# Patient Record
Sex: Male | Born: 1975
Health system: Southern US, Community
[De-identification: ages and names within clinical notes are randomized; demographics above are authoritative.]

## PROBLEM LIST (undated history)

## (undated) DIAGNOSIS — E1110 Type 2 diabetes mellitus with ketoacidosis without coma: Secondary | ICD-10-CM

## (undated) DIAGNOSIS — M069 Rheumatoid arthritis, unspecified: Secondary | ICD-10-CM

## (undated) DIAGNOSIS — Z227 Latent tuberculosis: Secondary | ICD-10-CM

## (undated) HISTORY — DX: Rheumatoid arthritis, unspecified: M06.9

## (undated) HISTORY — DX: Type 2 diabetes mellitus with ketoacidosis without coma: E11.10

## (undated) HISTORY — PX: EYE SURGERY: SHX253

## (undated) HISTORY — PX: HERNIA REPAIR: SHX51

## (undated) HISTORY — DX: Latent tuberculosis: Z22.7

---

## 2001-02-14 ENCOUNTER — Encounter: Payer: Self-pay | Admitting: Emergency Medicine

## 2001-02-14 ENCOUNTER — Inpatient Hospital Stay (HOSPITAL_COMMUNITY): Admission: EM | Admit: 2001-02-14 | Discharge: 2001-02-15 | Payer: Self-pay

## 2005-04-01 ENCOUNTER — Emergency Department (HOSPITAL_COMMUNITY): Admission: EM | Admit: 2005-04-01 | Discharge: 2005-04-01 | Payer: Self-pay | Admitting: Emergency Medicine

## 2007-08-15 ENCOUNTER — Emergency Department (HOSPITAL_COMMUNITY): Admission: EM | Admit: 2007-08-15 | Discharge: 2007-08-15 | Payer: Self-pay | Admitting: Emergency Medicine

## 2007-08-19 ENCOUNTER — Emergency Department (HOSPITAL_COMMUNITY): Admission: EM | Admit: 2007-08-19 | Discharge: 2007-08-19 | Payer: Self-pay | Admitting: Emergency Medicine

## 2007-12-19 ENCOUNTER — Ambulatory Visit (HOSPITAL_COMMUNITY): Admission: RE | Admit: 2007-12-19 | Discharge: 2007-12-19 | Payer: Self-pay | Admitting: Family Medicine

## 2008-02-25 ENCOUNTER — Encounter: Admission: RE | Admit: 2008-02-25 | Discharge: 2008-02-25 | Payer: Self-pay | Admitting: Rheumatology

## 2008-03-10 ENCOUNTER — Inpatient Hospital Stay (HOSPITAL_COMMUNITY): Admission: EM | Admit: 2008-03-10 | Discharge: 2008-03-13 | Payer: Self-pay | Admitting: Emergency Medicine

## 2008-03-12 ENCOUNTER — Encounter (INDEPENDENT_AMBULATORY_CARE_PROVIDER_SITE_OTHER): Payer: Self-pay | Admitting: *Deleted

## 2008-03-12 LAB — CONVERTED CEMR LAB
Cholesterol: 174 mg/dL
LDL Cholesterol: 121 mg/dL

## 2008-03-20 ENCOUNTER — Ambulatory Visit: Payer: Self-pay | Admitting: Internal Medicine

## 2008-05-21 ENCOUNTER — Encounter: Payer: Self-pay | Admitting: Internal Medicine

## 2008-06-03 ENCOUNTER — Encounter: Payer: Self-pay | Admitting: Internal Medicine

## 2008-06-12 ENCOUNTER — Encounter: Payer: Self-pay | Admitting: Internal Medicine

## 2008-06-18 ENCOUNTER — Encounter: Payer: Self-pay | Admitting: Internal Medicine

## 2008-06-24 ENCOUNTER — Encounter: Payer: Self-pay | Admitting: Internal Medicine

## 2008-06-24 ENCOUNTER — Ambulatory Visit: Payer: Self-pay | Admitting: *Deleted

## 2008-06-24 DIAGNOSIS — K4091 Unilateral inguinal hernia, without obstruction or gangrene, recurrent: Secondary | ICD-10-CM

## 2008-06-24 DIAGNOSIS — I200 Unstable angina: Secondary | ICD-10-CM | POA: Insufficient documentation

## 2008-06-24 DIAGNOSIS — E1165 Type 2 diabetes mellitus with hyperglycemia: Secondary | ICD-10-CM | POA: Insufficient documentation

## 2008-06-24 DIAGNOSIS — E113399 Type 2 diabetes mellitus with moderate nonproliferative diabetic retinopathy without macular edema, unspecified eye: Secondary | ICD-10-CM

## 2008-06-24 DIAGNOSIS — I10 Essential (primary) hypertension: Secondary | ICD-10-CM | POA: Insufficient documentation

## 2008-06-24 DIAGNOSIS — M0600 Rheumatoid arthritis without rheumatoid factor, unspecified site: Secondary | ICD-10-CM | POA: Insufficient documentation

## 2008-06-24 DIAGNOSIS — R079 Chest pain, unspecified: Secondary | ICD-10-CM

## 2008-06-24 DIAGNOSIS — I2089 Other forms of angina pectoris: Secondary | ICD-10-CM | POA: Insufficient documentation

## 2008-06-24 LAB — CONVERTED CEMR LAB
ALT: 17 units/L (ref 0–53)
AST: 21 units/L (ref 0–37)
Basophils Absolute: 0 10*3/uL (ref 0.0–0.1)
Basophils Relative: 1 % (ref 0–1)
Blood Glucose, Fingerstick: 195
CO2: 24 meq/L (ref 19–32)
Cholesterol: 187 mg/dL (ref 0–200)
Creatinine, Urine: 63.7 mg/dL
Eosinophils Relative: 5 % (ref 0–5)
HCT: 47.5 % (ref 39.0–52.0)
Hgb A1c MFr Bld: 11.9 %
LDL Cholesterol: 118 mg/dL — ABNORMAL HIGH (ref 0–99)
Lymphocytes Relative: 24 % (ref 12–46)
MCHC: 33.3 g/dL (ref 30.0–36.0)
Microalb Creat Ratio: 60.6 mg/g — ABNORMAL HIGH (ref 0.0–30.0)
Neutro Abs: 3.9 10*3/uL (ref 1.7–7.7)
Platelets: 338 10*3/uL (ref 150–400)
RDW: 12.7 % (ref 11.5–15.5)
Sodium: 136 meq/L (ref 135–145)
Total Bilirubin: 0.5 mg/dL (ref 0.3–1.2)
Total CHOL/HDL Ratio: 3.5
Total Protein: 7.9 g/dL (ref 6.0–8.3)
VLDL: 15 mg/dL (ref 0–40)

## 2008-07-09 ENCOUNTER — Ambulatory Visit (HOSPITAL_COMMUNITY): Admission: RE | Admit: 2008-07-09 | Discharge: 2008-07-09 | Payer: Self-pay | Admitting: *Deleted

## 2008-07-09 ENCOUNTER — Encounter: Payer: Self-pay | Admitting: Internal Medicine

## 2008-07-09 ENCOUNTER — Ambulatory Visit: Payer: Self-pay | Admitting: *Deleted

## 2008-07-09 DIAGNOSIS — E785 Hyperlipidemia, unspecified: Secondary | ICD-10-CM

## 2008-07-09 LAB — CONVERTED CEMR LAB: Blood Glucose, Fingerstick: 398

## 2008-09-10 ENCOUNTER — Ambulatory Visit: Payer: Self-pay | Admitting: Internal Medicine

## 2008-09-10 LAB — CONVERTED CEMR LAB: Blood Glucose, Fingerstick: 223

## 2008-09-11 ENCOUNTER — Ambulatory Visit: Payer: Self-pay | Admitting: Cardiology

## 2008-09-23 ENCOUNTER — Ambulatory Visit: Payer: Self-pay

## 2008-09-23 ENCOUNTER — Encounter: Payer: Self-pay | Admitting: Cardiology

## 2008-10-26 ENCOUNTER — Ambulatory Visit (HOSPITAL_COMMUNITY): Admission: RE | Admit: 2008-10-26 | Discharge: 2008-10-27 | Payer: Self-pay | Admitting: Surgery

## 2008-10-30 ENCOUNTER — Telehealth: Payer: Self-pay | Admitting: *Deleted

## 2008-11-27 ENCOUNTER — Telehealth (INDEPENDENT_AMBULATORY_CARE_PROVIDER_SITE_OTHER): Payer: Self-pay | Admitting: *Deleted

## 2008-12-21 ENCOUNTER — Telehealth: Payer: Self-pay | Admitting: Internal Medicine

## 2008-12-24 ENCOUNTER — Ambulatory Visit: Payer: Self-pay | Admitting: *Deleted

## 2008-12-24 ENCOUNTER — Encounter: Payer: Self-pay | Admitting: Internal Medicine

## 2008-12-24 LAB — CONVERTED CEMR LAB
BUN: 14 mg/dL (ref 6–23)
Blood Glucose, Fingerstick: 266
CO2: 22 meq/L (ref 19–32)
Calcium: 9.5 mg/dL (ref 8.4–10.5)
Chloride: 103 meq/L (ref 96–112)
Creatinine, Ser: 0.56 mg/dL (ref 0.40–1.50)
Glucose, Bld: 166 mg/dL — ABNORMAL HIGH (ref 70–99)
Hgb A1c MFr Bld: 12 %
Total Bilirubin: 0.4 mg/dL (ref 0.3–1.2)

## 2009-01-18 ENCOUNTER — Telehealth (INDEPENDENT_AMBULATORY_CARE_PROVIDER_SITE_OTHER): Payer: Self-pay | Admitting: *Deleted

## 2009-01-27 ENCOUNTER — Telehealth: Payer: Self-pay | Admitting: Internal Medicine

## 2009-01-28 ENCOUNTER — Encounter (INDEPENDENT_AMBULATORY_CARE_PROVIDER_SITE_OTHER): Payer: Self-pay | Admitting: Internal Medicine

## 2009-01-28 ENCOUNTER — Ambulatory Visit: Payer: Self-pay | Admitting: *Deleted

## 2009-01-28 ENCOUNTER — Telehealth (INDEPENDENT_AMBULATORY_CARE_PROVIDER_SITE_OTHER): Payer: Self-pay | Admitting: *Deleted

## 2009-01-28 LAB — CONVERTED CEMR LAB
Blood Glucose, Fingerstick: 302
Blood Glucose, Home Monitor: 2 mg/dL

## 2009-01-29 ENCOUNTER — Telehealth (INDEPENDENT_AMBULATORY_CARE_PROVIDER_SITE_OTHER): Payer: Self-pay | Admitting: Internal Medicine

## 2009-01-29 LAB — CONVERTED CEMR LAB
ALT: 64 units/L — ABNORMAL HIGH (ref 0–53)
AST: 32 units/L (ref 0–37)
CO2: 23 meq/L (ref 19–32)
Calcium: 9.4 mg/dL (ref 8.4–10.5)
Chloride: 102 meq/L (ref 96–112)
Creatinine, Ser: 0.61 mg/dL (ref 0.40–1.50)
Platelets: 286 10*3/uL (ref 150–400)
RDW: 12.8 % (ref 11.5–15.5)
Sodium: 137 meq/L (ref 135–145)
Total Protein: 7.2 g/dL (ref 6.0–8.3)
WBC: 5.8 10*3/uL (ref 4.0–10.5)

## 2009-02-24 ENCOUNTER — Telehealth (INDEPENDENT_AMBULATORY_CARE_PROVIDER_SITE_OTHER): Payer: Self-pay | Admitting: *Deleted

## 2009-02-25 ENCOUNTER — Encounter (INDEPENDENT_AMBULATORY_CARE_PROVIDER_SITE_OTHER): Payer: Self-pay | Admitting: *Deleted

## 2009-03-02 ENCOUNTER — Ambulatory Visit: Payer: Self-pay | Admitting: Internal Medicine

## 2009-03-02 ENCOUNTER — Encounter: Payer: Self-pay | Admitting: Internal Medicine

## 2009-03-04 LAB — CONVERTED CEMR LAB
ALT: 44 units/L (ref 0–53)
CO2: 23 meq/L (ref 19–32)
Calcium: 10.3 mg/dL (ref 8.4–10.5)
Chloride: 102 meq/L (ref 96–112)
GFR calc Af Amer: >60 mL/min (ref >60)
GFR calc non Af Amer: >60 mL/min (ref >60)
Glucose, Bld: 117 mg/dL — ABNORMAL HIGH (ref 70–99)
LDL Cholesterol: 106 mg/dL — ABNORMAL HIGH (ref 0–99)
Sodium: 139 meq/L (ref 135–145)
Total Bilirubin: 0.4 mg/dL (ref 0.3–1.2)
Total Protein: 8 g/dL (ref 6.0–8.3)

## 2009-04-16 ENCOUNTER — Telehealth (INDEPENDENT_AMBULATORY_CARE_PROVIDER_SITE_OTHER): Payer: Self-pay | Admitting: *Deleted

## 2009-04-30 ENCOUNTER — Encounter: Payer: Self-pay | Admitting: Internal Medicine

## 2009-05-04 ENCOUNTER — Ambulatory Visit: Payer: Self-pay | Admitting: Internal Medicine

## 2009-05-04 ENCOUNTER — Encounter: Payer: Self-pay | Admitting: Internal Medicine

## 2009-05-04 ENCOUNTER — Encounter (INDEPENDENT_AMBULATORY_CARE_PROVIDER_SITE_OTHER): Payer: Self-pay | Admitting: Internal Medicine

## 2009-05-04 LAB — CONVERTED CEMR LAB
Blood Glucose, Fingerstick: 214
Hgb A1c MFr Bld: 5.8 %

## 2009-05-05 LAB — CONVERTED CEMR LAB
ALT: 58 units/L — ABNORMAL HIGH (ref 0–53)
AST: 37 units/L (ref 0–37)
Albumin: 4.7 g/dL (ref 3.5–5.2)
Alkaline Phosphatase: 89 units/L (ref 39–117)
Calcium: 9.9 mg/dL (ref 8.4–10.5)
Chloride: 106 meq/L (ref 96–112)
Eosinophils Absolute: 0.7 10*3/uL (ref 0.0–0.7)
HCT: 46.9 % (ref 39.0–52.0)
Hemoglobin: 16.3 g/dL (ref 13.0–17.0)
Lymphs Abs: 2.1 10*3/uL (ref 0.7–4.0)
MCV: 89.3 fL (ref 78.0–100.0)
Microalb, Ur: 14.53 mg/dL — ABNORMAL HIGH (ref 0.00–1.89)
Monocytes Absolute: 0.5 10*3/uL (ref 0.1–1.0)
Monocytes Relative: 8 % (ref 3–12)
Neutrophils Relative %: 48 % (ref 43–77)
Potassium: 4.1 meq/L (ref 3.5–5.3)
RBC: 5.25 M/uL (ref 4.22–5.81)
Sodium: 139 meq/L (ref 135–145)
Total Protein: 7.6 g/dL (ref 6.0–8.3)
WBC: 6.6 10*3/uL (ref 4.0–10.5)

## 2009-07-22 ENCOUNTER — Encounter: Payer: Self-pay | Admitting: Internal Medicine

## 2009-07-30 ENCOUNTER — Encounter: Payer: Self-pay | Admitting: Internal Medicine

## 2009-08-23 ENCOUNTER — Telehealth (INDEPENDENT_AMBULATORY_CARE_PROVIDER_SITE_OTHER): Payer: Self-pay | Admitting: *Deleted

## 2009-10-08 ENCOUNTER — Telehealth (INDEPENDENT_AMBULATORY_CARE_PROVIDER_SITE_OTHER): Payer: Self-pay | Admitting: *Deleted

## 2009-10-08 ENCOUNTER — Telehealth: Payer: Self-pay | Admitting: Internal Medicine

## 2009-10-12 ENCOUNTER — Telehealth (INDEPENDENT_AMBULATORY_CARE_PROVIDER_SITE_OTHER): Payer: Self-pay | Admitting: *Deleted

## 2009-10-12 ENCOUNTER — Telehealth: Payer: Self-pay | Admitting: *Deleted

## 2009-10-12 ENCOUNTER — Ambulatory Visit: Payer: Self-pay | Admitting: Internal Medicine

## 2009-10-12 DIAGNOSIS — R51 Headache: Secondary | ICD-10-CM

## 2009-10-12 DIAGNOSIS — R519 Headache, unspecified: Secondary | ICD-10-CM | POA: Insufficient documentation

## 2009-10-13 ENCOUNTER — Encounter: Payer: Self-pay | Admitting: Internal Medicine

## 2010-08-22 ENCOUNTER — Telehealth (INDEPENDENT_AMBULATORY_CARE_PROVIDER_SITE_OTHER): Payer: Self-pay | Admitting: *Deleted

## 2010-08-25 ENCOUNTER — Ambulatory Visit: Payer: Self-pay | Admitting: Internal Medicine

## 2010-08-25 DIAGNOSIS — K219 Gastro-esophageal reflux disease without esophagitis: Secondary | ICD-10-CM

## 2010-08-25 LAB — CONVERTED CEMR LAB
Blood Glucose, Fingerstick: 281
Hgb A1c MFr Bld: 12.3 %

## 2010-09-15 ENCOUNTER — Ambulatory Visit: Payer: Self-pay | Admitting: Internal Medicine

## 2010-09-15 LAB — CONVERTED CEMR LAB: Blood Glucose, Fingerstick: 447

## 2010-09-16 ENCOUNTER — Encounter: Payer: Self-pay | Admitting: Internal Medicine

## 2010-11-15 ENCOUNTER — Encounter: Payer: Self-pay | Admitting: Internal Medicine

## 2010-11-15 ENCOUNTER — Ambulatory Visit (HOSPITAL_COMMUNITY)
Admission: RE | Admit: 2010-11-15 | Discharge: 2010-11-15 | Payer: Self-pay | Source: Home / Self Care | Attending: Internal Medicine | Admitting: Internal Medicine

## 2010-11-15 ENCOUNTER — Ambulatory Visit: Payer: Self-pay | Admitting: Internal Medicine

## 2010-11-15 ENCOUNTER — Ambulatory Visit: Admit: 2010-11-15 | Payer: Self-pay

## 2010-11-15 LAB — CONVERTED CEMR LAB
BUN: 15 mg/dL (ref 6–23)
Bilirubin, Direct: 0.1 mg/dL (ref 0.0–0.3)
Chloride: 101 meq/L (ref 96–112)
Glucose, Bld: 163 mg/dL — ABNORMAL HIGH (ref 70–99)
Glucose, Urine, Semiquant: >=1000
HIV: NONREACTIVE
Hemoglobin: 15.3 g/dL (ref 13.0–17.0)
Indirect Bilirubin: 0.5 mg/dL (ref 0.0–0.9)
LDL Cholesterol: 163 mg/dL — ABNORMAL HIGH (ref 0–99)
MCHC: 33.9 g/dL (ref 30.0–36.0)
MCV: 90.4 fL (ref 78.0–100.0)
Microalb, Ur: 22.88 mg/dL — ABNORMAL HIGH (ref 0.00–1.89)
Nitrite: NEGATIVE
Potassium: 3.9 meq/L (ref 3.5–5.3)
RBC: 4.99 M/uL (ref 4.22–5.81)
Specific Gravity, Urine: >=1.03
Total Bilirubin: 0.6 mg/dL (ref 0.3–1.2)
VLDL: 32 mg/dL (ref 0–40)
pH: 6

## 2010-11-23 ENCOUNTER — Telehealth: Payer: Self-pay | Admitting: Internal Medicine

## 2010-11-23 ENCOUNTER — Telehealth (INDEPENDENT_AMBULATORY_CARE_PROVIDER_SITE_OTHER): Payer: Self-pay | Admitting: *Deleted
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
## 2010-12-20 NOTE — Assessment & Plan Note (Signed)
Summary: NERVES/SB.   Vital Signs:  Patient profile:   35 year old male Height:      63 inches (160.02 cm) Weight:      141.06 pounds (64.12 kg) BMI:     25.08 Temp:     98.9 degrees F (37.17 degrees C) oral Pulse rate:   85 / minute BP sitting:   124 / 72  (right arm)  Vitals Entered By: Angelina Ok RN (September 15, 2010 9:22 AM) Is Patient Diabetic? Yes Did you bring your meter with you today? No Pain Assessment Patient in pain? yes     Location: Right leg down to leg Intensity: 9 Type: aching Onset of pain  Constant Nutritional Status BMI of 25 - 29 = overweight CBG Result 447  Have you ever been in a relationship where you felt threatened, hurt or afraid?No   Does patient need assistance? Functional Status Self care Ambulation Normal Comments Check up.   Primary Care Loree Shehata:  Laren Everts MD   History of Present Illness: 35 yr old man with pmhx as described below comes to the clinic for follow up of inguinal hernia. Patient reports that is has to give 1,500 deposit for the surgery to be done. Patient is having normal bowel movements. Patient reports that hernia is reducable. He has been having fever, chills, nausea, worsening abdominal pain in area of hernia.    Patient is also having right knee pain 2/2 RA. In the past he has gotten steroid injection which helped. Last time patient had a steroid injection was March 2011. He would like another shot.   Patient is taking 2 tablets of Metformin daily.  Depression History:      The patient denies a depressed mood most of the day and a diminished interest in his usual daily activities.         Preventive Screening-Counseling & Management  Alcohol-Tobacco     Alcohol drinks/day: 0     Smoking Status: never  Problems Prior to Update: 1)  Gerd  (ICD-530.81) 2)  Headache  (ICD-784.0) 3)  Cough  (ICD-786.2) 4)  Hyperlipidemia  (ICD-272.4) 5)  Hernia  (ICD-553.9) 6)  Essential Hypertension   (ICD-401.9) 7)  Positive Ppd  (ICD-795.5) 8)  Ankle Edema  (ICD-782.3) 9)  Rheumatoid Arthritis  (ICD-714.0) 10)  Chest Pain, Intermittent  (ICD-786.50) 11)  Fatigue  (ICD-780.79) 12)  Family History Diabetes 1st Degree Relative  (ICD-V18.0) 13)  Diabetes Mellitus, Type II  (ICD-250.00)  Medications Prior to Update: 1)  Humulin 70/30 70-30 % Susp (Insulin Isophane & Regular) .... Inject 25 Units in The Morning and 15 Units At Night. 2)  Prednisone 10 Mg  Tabs (Prednisone) .... Take One Tab Daily. 3)  Pravastatin Sodium 10 Mg  Tabs (Pravastatin Sodium) .... Take 1 Tablet By Mouth Once A Day 4)  Aspirin 81 Mg  Tbec (Aspirin) .... Take 1 Tablet By Mouth Once A Day 5)  Metformin Hcl 500 Mg Xr24h-Tab (Metformin Hcl) .... Take 3 Tablet By Mouth Once A Day 6)  Percocet 5-325 Mg Tabs (Oxycodone-Acetaminophen) .... Take 1 Tablet Every 6-8 Hour For Pain As Needed. 7)  Protonix 40 Mg Pack (Pantoprazole Sodium) .... Take 1 Tablet By Mouth Two Times A Day 8)  Humira 40 Mg/0.70ml Kit (Adalimumab)  Current Medications (verified): 1)  Humulin 70/30 70-30 % Susp (Insulin Isophane & Regular) .... Inject 25 Units in The Morning and 15 Units At Night. 2)  Prednisone 10 Mg  Tabs (Prednisone) .... Take One  Tab Daily. 3)  Pravastatin Sodium 10 Mg  Tabs (Pravastatin Sodium) .... Take 1 Tablet By Mouth Once A Day 4)  Aspirin 81 Mg  Tbec (Aspirin) .... Take 1 Tablet By Mouth Once A Day 5)  Metformin Hcl 500 Mg Xr24h-Tab (Metformin Hcl) .... Take 3 Tablet By Mouth Once A Day 6)  Percocet 5-325 Mg Tabs (Oxycodone-Acetaminophen) .... Take 1 Tablet Every 6-8 Hour For Pain As Needed. 7)  Protonix 40 Mg Pack (Pantoprazole Sodium) .... Take 1 Tablet By Mouth Two Times A Day 8)  Humira 40 Mg/0.73ml Kit (Adalimumab)  Allergies: No Known Drug Allergies  Past History:  Past Medical History: Last updated: 06/24/2008 1)DKA (hospitalized in 03/10/2008) 2)DM-Diagnosed in April when admitted to [facility] with blood  glucose of 581. HgA1c was 9.9.  3)Rheumatoid Arthritis- Managed by Dr. Pollyann Savoy. Seronegative. Dr. 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 Chest X-ray on 06/03/08 showed no active disease or adenopathy. Started to take Isoniazid 300mg  daily on 06/11/08 and will take medication along with Vitamin B6 for 9 months.  Family History: Last updated: 06/24/2008 Family History Diabetes 1st degree relative Family History Hypertension  Social History: Last updated: 06/24/2008 Never Smoked Alcohol use-no Drug use-no Regular exercise-yes  Risk Factors: Alcohol Use: 0 (09/15/2010) Exercise: no (10/12/2009)  Risk Factors: Smoking Status: never (09/15/2010)  Family History: Reviewed history from 06/24/2008 and no changes required. Family History Diabetes 1st degree relative Family History Hypertension  Social History: Reviewed history from 06/24/2008 and no changes required. Never Smoked Alcohol use-no Drug use-no Regular exercise-yes  Review of Systems  The patient denies fever, chest pain, dyspnea on exertion, hemoptysis, melena, hematochezia, hematuria, and muscle weakness.    Physical Exam  General:  NAD Mouth:  MMM Lungs:  normal respiratory effort, no intercostal retractions, no accessory muscle use, and normal breath sounds.   Heart:  normal rate and regular rhythm.   Abdomen:  soft, non-tender, and normal bowel sounds.  Right reducable inguinal hernia Msk:  no joint swelling, no joint warmth, and no redness over joints.    ttp lateral aspect of right knee Extremities:  no edema Neurologic:  alert & oriented X3.     Impression & Recommendations:  Problem # 1:  HERNIA (ICD-553.9) Hernia reducable and is having normal bowel movements. Will contact Jaynee Eagles to see if patient is candidate for other finacial assistance to help expediate surgery.  Patient was instructed to continue reducing hernia to prevent strangulation until surgery can be done, and to go to the ED if he develops change in bowel movement, fever, chills, nausea, vomiting, and worsening of abdominal pain.   Problem # 2:  RHEUMATOID ARTHRITIS (ICD-714.0) After written consent was obtained, pt was placed in appropriate position. Landmarks identified by palpation. Skin was sterilized with iodine and alcohol. Numbing spray was applied to the injection site. The right subpatellar space was penetrated easily with an 22 gauge 1 1/2" needle. 3mL of Kenalog (40mg /mL) and 5mL of 1% lidocaine were injected in the joint w/o resistance. Pressure applied to injection site. Sterile dressing placed. Procedure well tolerated. No complications.   Orders: Joint Aspirate / Injection, Large (20610)  Problem # 3:  DIABETES MELLITUS, TYPE II (ICD-250.00) Uncontrolled. Patient instructed to increase morning insulin to 30 units and night insulin to 20 units. Patient was also only taking 2 tablets of metformin. Instructed to increase Metformin to 3 tablets  daily.   His updated medication list for this problem includes:    Humulin 70/30 70-30 % Susp (Insulin isophane & regular) ..... Inject 30 units in the morning and 20 units at night.    Aspirin 81 Mg Tbec (Aspirin) .Marland Kitchen... Take 1 tablet by mouth once a day    Metformin Hcl 500 Mg Xr24h-tab (Metformin hcl) .Marland Kitchen... Take 3 tablet by mouth once a day  Orders: Capillary Blood Glucose/CBG (16109)  Labs Reviewed: Creat: 0.75 (05/04/2009)    Reviewed HgBA1c results: 12.3 (08/25/2010)  7.7 (10/12/2009)  Problem # 4:  ESSENTIAL HYPERTENSION (ICD-401.9) At goal. Continue to monitor.  BP today: 124/72 Prior BP: 130/79 (08/25/2010)  Labs Reviewed: K+: 4.1 (05/04/2009) Creat: : 0.75 (05/04/2009)   Chol: 215 (03/02/2009)   HDL: 60 (03/02/2009)   LDL: 106 (03/02/2009)   TG: 246 (03/02/2009)  Complete Medication List: 1)  Humulin 70/30 70-30 % Susp  (Insulin isophane & regular) .... Inject 30 units in the morning and 20 units at night. 2)  Prednisone 10 Mg Tabs (Prednisone) .... Take one tab daily. 3)  Pravastatin Sodium 10 Mg Tabs (Pravastatin sodium) .... Take 1 tablet by mouth once a day 4)  Aspirin 81 Mg Tbec (Aspirin) .... Take 1 tablet by mouth once a day 5)  Metformin Hcl 500 Mg Xr24h-tab (Metformin hcl) .... Take 3 tablet by mouth once a day 6)  Percocet 5-325 Mg Tabs (Oxycodone-acetaminophen) .... Take 1 tablet every 6-8 hour for pain as needed. 7)  Protonix 40 Mg Pack (Pantoprazole sodium) .... Take 1 tablet by mouth two times a day 8)  Humira 40 Mg/0.98ml Kit (Adalimumab)  Patient Instructions: 1)  Please schedule a follow-up appointment in 1 month. 2)  Take all medication as directed.   Orders Added: 1)  Capillary Blood Glucose/CBG [82948] 2)  Joint Aspirate / Injection, Large [20610] 3)  Est. Patient Level III [60454]     Vital Signs:  Patient profile:   35 year old male Height:      63 inches (160.02 cm) Weight:      141.06 pounds (64.12 kg) BMI:     25.08 Temp:     98.9 degrees F (37.17 degrees C) oral Pulse rate:   85 / minute BP sitting:   124 / 72  (right arm)  Vitals Entered By: Angelina Ok RN (September 15, 2010 9:22 AM)    Prevention & Chronic Care Immunizations   Influenza vaccine: refuses  (09/10/2008)    Tetanus booster: Not documented    Pneumococcal vaccine: Not documented  Other Screening   Smoking status: never  (09/15/2010)  Diabetes Mellitus   HgbA1C: 12.3  (08/25/2010)    Eye exam: Not documented    Foot exam: yes  (08/25/2010)   High risk foot: Yes  (08/25/2010)   Foot care education: Not documented    Urine microalbumin/creatinine ratio: 86.7  (05/04/2009)  Lipids   Total Cholesterol: 215  (03/02/2009)   LDL: 106  (03/02/2009)   LDL Direct: Not documented   HDL: 60  (03/02/2009)   Triglycerides: 246  (03/02/2009)    SGOT (AST): 37  (05/04/2009)   SGPT (ALT): 58   (05/04/2009)   Alkaline phosphatase: 89  (05/04/2009)   Total bilirubin: 0.4  (05/04/2009)  Hypertension   Last Blood Pressure: 124 / 72  (09/15/2010)   Serum creatinine: 0.75  (05/04/2009)   Serum potassium 4.1  (05/04/2009)  Self-Management Support :   Personal Goals (by the next clinic visit) :  Personal A1C goal: 7  (08/25/2010)     Personal blood pressure goal: 130/80  (08/25/2010)     Personal LDL goal: 100  (08/25/2010)    Patient will work on the following items until the next clinic visit to reach self-care goals:     Medications and monitoring: take my medicines every day, check my blood sugar, bring all of my medications to every visit, examine my feet every day  (09/15/2010)     Eating: drink diet soda or water instead of juice or soda, eat more vegetables, use fresh or frozen vegetables, eat foods that are low in salt, eat baked foods instead of fried foods, eat fruit for snacks and desserts, limit or avoid alcohol  (09/15/2010)     Activity: take a 30 minute walk every day  (09/15/2010)    Diabetes self-management support: Written self-care plan, Education handout, Pre-printed educational material, Resources for patients handout  (09/15/2010)   Diabetes care plan printed   Diabetes education handout printed   Last diabetes self-management training by diabetes educator: 01/28/2009    Hypertension self-management support: Written self-care plan, Education handout, Pre-printed educational material, Resources for patients handout  (09/15/2010)   Hypertension self-care plan printed.   Hypertension education handout printed    Lipid self-management support: Written self-care plan, Education handout, Pre-printed educational material, Resources for patients handout  (09/15/2010)   Lipid self-care plan printed.   Lipid education handout printed      Resource handout printed.

## 2010-12-20 NOTE — Miscellaneous (Signed)
Summary: CONSENT FOR MEDICAL /SURGICAL  CONSENT FOR MEDICAL /SURGICAL   Imported By: Margie Billet 09/21/2010 15:54:12  _____________________________________________________________________  External Attachment:    Type:   Image     Comment:   External Document

## 2010-12-20 NOTE — Progress Notes (Signed)
Summary: support6/dmr  Phone Note Call from Patient Call back at Home Phone [phone]   Caller: Patient Summary of Call: patient called to find out what time his appolintment is this week- reminded him to see the Financial counselor if needed also.  Initial call taken by: Jamison Neighbor RD,CDE,  August 22, 2010 3:25 PM

## 2010-12-22 NOTE — Assessment & Plan Note (Signed)
Summary: MALE PROBLEM/SB.   Vital Signs:  Patient profile:   35 year old male Height:      63 inches Weight:      146.4 pounds BMI:     26.03 Temp:     97.8 degrees F oral Pulse rate:   75 / minute BP sitting:   124 / 85  (right arm)  Vitals Entered By: Filomena Jungling NT II (November 15, 2010 10:39 AM) CC: Male problems Is Patient Diabetic? Yes Did you bring your meter with you today? No Nutritional Status BMI of 25 - 29 = overweight CBG Result 159  Have you ever been in a relationship where you felt threatened, hurt or afraid?No   Does patient need assistance? Functional Status Self care Ambulation Normal   Primary Care Ariea Rochin:  Laren Everts MD  CC:  Male problems.  History of Present Illness: 1. Patient c/o fever (subjective), chills and night sweats with an increase in dry cough. Denies any Pleuritic pain, SOB, CP, or leg swelling.  2. Patient c/o epigastric pain that has no particular pattern and not-related to a food intake. patient is unable to describe aggravating or alleviating factors. 3. C/o increasing right knee pain with stiffness and decreased ROM . Hx of rheumatoid arhtritis; seen a rhumatologist 3 months ago without an plan for a follow up. Patient is on immunosuppressive therapy. 4. Patient denies "any male problems."   Preventive Screening-Counseling & Management  Alcohol-Tobacco     Alcohol drinks/day: 0     Smoking Status: never  Caffeine-Diet-Exercise     Does Patient Exercise: no  Current Problems (verified): 1)  Diabetes Mellitus, Type II  (ICD-250.00) 2)  Rheumatoid Arthritis  (ICD-714.0) 3)  Positive Ppd  (ICD-795.5) 4)  Essential Hypertension  (ICD-401.9) 5)  Hyperlipidemia  (ICD-272.4) 6)  Gerd  (ICD-530.81) 7)  Headache  (ICD-784.0) 8)  Hernia  (ICD-553.9) 9)  Chest Pain, Intermittent  (ICD-786.50)  Current Medications (verified): 1)  Humulin 70/30 70-30 % Susp (Insulin Isophane & Regular) .... Inject 30 Units in The  Morning and 20 Units At Night. 2)  Prednisone 10 Mg  Tabs (Prednisone) .... Take One Tab Daily. 3)  Pravastatin Sodium 10 Mg  Tabs (Pravastatin Sodium) .... Take 1 Tablet By Mouth Once A Day 4)  Aspirin 81 Mg  Tbec (Aspirin) .... Take 1 Tablet By Mouth Once A Day 5)  Metformin Hcl 500 Mg Xr24h-Tab (Metformin Hcl) .... Take 3 Tablet By Mouth Once A Day 6)  Percocet 5-325 Mg Tabs (Oxycodone-Acetaminophen) .... Take 1 Tablet Every 6-8 Hour For Pain As Needed. 7)  Protonix 40 Mg Pack (Pantoprazole Sodium) .... Take 1 Tablet By Mouth Two Times A Day 8)  Humira 40 Mg/0.63ml Kit (Adalimumab)  Allergies (verified): No Known Drug Allergies  Physical Exam  General:  NADwell-developed, well-nourished, and well-hydrated.   Head:  normocephalic and no abnormalities observed.   Eyes:  vision grossly intact.  vision grossly intact.   Ears:  External ear exam shows no significant lesions or deformities.  Otoscopic examination reveals clear canals, tympanic membranes are intact bilaterally without bulging, retraction, inflammation or discharge. Hearing is grossly normal bilaterally. Nose:  External nasal examination shows no deformity or inflammation. Nasal mucosa are pink and moist without lesions or exudates. Mouth:  Oral mucosa and oropharynx without lesions or exudates.  Teeth in good repair. Neck:  No deformities, masses, or tenderness noted. Lungs:  Normal respiratory effort, chest expands symmetrically. Right LLL with ronchi and rales. Heart:  Normal  rate and regular rhythm. S1 and S2 normal No murmur, click, rub or other extra sounds. Abdomen:  Bowel sounds positive,abdomen soft and non-tender without masses, organomegaly or hernias noted. Msk:  No deformity or scoliosis noted of thoracic or lumbar spine.   Pulses:  R and L carotid,radial,femoral,dorsalis pedis and posterior tibial pulses are full and equal bilaterally Extremities:  No clubbing, cyanosis, edema, or deformity noted with normal full  range of motion of all joints.   Neurologic:  No cranial nerve deficits noted. Station and gait are normal. Plantar reflexes are down-going bilaterally. DTRs are symmetrical throughout. Sensory, motor and coordinative functions appear intact. Skin:  Intact without suspicious lesions or rashes Cervical Nodes:  No lymphadenopathy noted Axillary Nodes:  No palpable lymphadenopathy Inguinal Nodes:  No significant adenopathy Psych:  Cognition and judgment appear intact. Alert and cooperative with normal attention span and concentration. No apparent delusions, illusions, hallucinations   Impression & Recommendations:  Problem # 1:  DIABETES MELLITUS, TYPE II (ICD-250.00) Assessment Improved Improved but still suboptimal control. Risks of DM discussed with the patient. Requested to  make an appointment for a DME with Ms. Victory Dakin. CBG's reviewed. Willl increase Insulin dose to 32 Units in am and 22 Units at bedtime. Strongly advised to check CBG's as instructed. His updated medication list for this problem includes:    Humulin 70/30 70-30 % Susp (Insulin isophane & regular) ..... Inject 32 units in the morning and 22 units at night.    Aspirin 81 Mg Tbec (Aspirin) .Marland Kitchen... Take 1 tablet by mouth once a day    Metformin Hcl 500 Mg Xr24h-tab (Metformin hcl) .Marland Kitchen... Take 3 tablet by mouth once a day  Orders: T- Capillary Blood Glucose (11914) T-Hgb A1C (in-house) (78295AO) Ophthalmology Referral (Ophthalmology) DME Referral (DME) T-Urine Microalbumin w/creat. ratio [phone])  Labs Reviewed: Creat: 0.75 (05/04/2009)    Reviewed HgBA1c results: 12.3 (08/25/2010)  7.7 (10/12/2009)  Labs Reviewed: Creat: 0.75 (05/04/2009)    Reviewed HgBA1c results: 9.3 (11/15/2010)  12.3 (08/25/2010)  Problem # 2:  HYPERLIPIDEMIA (ICD-272.4) Assessment: Unchanged diet, exercise discussed with the patient. His updated medication list for this problem includes:    Pravastatin Sodium 10 Mg Tabs (Pravastatin  sodium) .Marland Kitchen... Take 1 tablet by mouth once a day    Labs Reviewed: SGOT: 37 (05/04/2009)   SGPT: 58 (05/04/2009)   HDL:60 (03/02/2009), 54 (06/24/2008)  LDL:106 (03/02/2009), 118 (06/24/2008)  Chol:215 (03/02/2009), 187 (06/24/2008)  Trig:246 (03/02/2009), 74 (06/24/2008)  Orders: T-Lipid Profile ([id number])  Problem # 3:  RHEUMATOID ARTHRITIS (ICD-714.0) Assessment: Unchanged Right knee XR without any suspicion for truma or infection. will continue with his current regimen and assist with a f/u appointment with a rheumatologist. Orders: Rheumatology Referral (Rheumatology) T-Antinuclear Antib (ANA) [phone]) T-HIV Antibody  (Reflex) [phone]) T-Rheumatoid Factor [id number]) T-Sed Rate (Automated) (95188-41660)  Problem # 4:  POSITIVE PPD (ICD-795.5) Assessment: Unchanged  hx of positive PPD 2 years ago (in 2009?); patient underwent a 9 months Abx therapy.Patient c/o fever, mild nocturnal sweats. Hx of positive PPD and patient is on immunosupressant therapy for rheumatoid arthritis.  CXR ordered and reviewed. Repeat CXR done today is WNL. If  cough, fever, chills persist --will consult ID on the best route of a work up (?bronchoscopy).  RAD is unlikely by presentation. will discuss with Dr. Phillips Odor (Attending).  Orders: CXR- 2view (CXR) T-CBC No Diff ([id number])  Problem # 5:  KNEE PAIN, RIGHT, ACUTE (ICD-719.46) Assessment: New Hx of rheumatoid DJD. Will recheck Rheum factoro, ANA and have the  patient f/u with a rheumatologist. His updated medication list for this problem includes:    Aspirin 81 Mg Tbec (Aspirin) .Marland Kitchen... Take 1 tablet by mouth once a day    Percocet 5-325 Mg Tabs (Oxycodone-acetaminophen) .Marland Kitchen... Take 1 tablet every 6-8 hour for pain as needed.  Discussed strengthening exercises, use of ice or heat, and medications.   Problem # 6:  POSITIVE PPD (ICD-795.5)  Complete Medication List: 1)  Humulin 70/30 70-30 % Susp (Insulin isophane & regular) ....  Inject 32 units in the morning and 22 units at night. 2)  Prednisone 10 Mg Tabs (Prednisone) .... Take one tab daily. 3)  Pravastatin Sodium 10 Mg Tabs (Pravastatin sodium) .... Take 1 tablet by mouth once a day 4)  Aspirin 81 Mg Tbec (Aspirin) .... Take 1 tablet by mouth once a day 5)  Metformin Hcl 500 Mg Xr24h-tab (Metformin hcl) .... Take 3 tablet by mouth once a day 6)  Percocet 5-325 Mg Tabs (Oxycodone-acetaminophen) .... Take 1 tablet every 6-8 hour for pain as needed. 7)  Protonix 40 Mg Pack (Pantoprazole sodium) .... Take 1 tablet by mouth two times a day 8)  Humira 40 Mg/0.18ml Kit (Adalimumab) 9)  Accu-chek Aviva Kit (Blood glucose monitoring suppl) .... Check blood glucose before breakfast and before bedtime for 7 days, then check once a day before breakfast 10)  Accu-chek Aviva Strp (Glucose blood) .... Check blood sugar before breakfast and before bed time for 7 days then once a day before breakfast 11)  Accu-chek Softclix Lancet Dev Kit (Lancets misc.) .... Check your blood sugar as instructed 12)  Ranitidine Hcl 150 Mg Caps (Ranitidine hcl) .... Take one tablet by mouth two times a day ac for stomach pain  Other Orders: T-Urinalysis Dipstick only (16109UE) Diagnostic X-Ray/Fluoroscopy (Diagnostic X-Ray/Flu) Tdap => 71yrs IM (45409) Admin 1st Vaccine (81191) Pneumococcal Vaccine (47829) Admin of Any Addtl Vaccine (56213) T-Hepatic Function (08657-84696) T-Basic Metabolic Panel (29528-41324)  Patient Instructions: 1)  Please, take all your medications as instructed. 2)  Please, make an appointment with Ms. Victory Dakin for a diabetic teaching; Arthritis specialist and an eye doctor. 3)  You recieved a flu, Tdap and Pneumonia vaccination today. 4)  Please, follow up in 8 weeks or sooner and call with any questions. Prescriptions: RANITIDINE HCL 150 MG CAPS (RANITIDINE HCL) Take one tablet by mouth two times a day ac for stomach pain  #60 x 2   Entered and Authorized by:   Deatra Robinson MD   Signed by:   Deatra Robinson MD on 11/15/2010   Method used:   Electronically to        [facility]  [phone]* (retail)       [address]       [place], [place]  [address]       Ph: [phone] or [phone]       Fax: [phone]   RxID:   [id number] ACCU-CHEK SOFTCLIX LANCET DEV  KIT (LANCETS MISC.) Check your blood sugar as instructed  #100 x 11   Entered and Authorized by:   Deatra Robinson MD   Signed by:   Deatra Robinson MD on 11/15/2010   Method used:   Electronically to        [facility]  [phone]* (retail)       [address]       [place], [place]  [address]  Ph: [phone] or [phone]       Fax: [phone]   RxID:   [id number] ACCU-CHEK AVIVA  STRP (GLUCOSE BLOOD) check blood sugar before breakfast and before bed time for 7 days then once a day before breakfast  #100 x 11   Entered and Authorized by:   Deatra Robinson MD   Signed by:   Deatra Robinson MD on 11/15/2010   Method used:   Electronically to        [facility]  [phone]* (retail)       [address]       [place], [place]  [address]       Ph: [phone] or [phone]       Fax: [phone]   RxID:   [id number] ACCU-CHEK AVIVA  KIT (BLOOD GLUCOSE MONITORING SUPPL) check blood glucose before breakfast and before bedtime for 7 days, then check once a day before breakfast  #1 x 0   Entered and Authorized by:   Deatra Robinson MD   Signed by:   Deatra Robinson MD on 11/15/2010   Method used:   Electronically to        [facility]  [phone]* (retail)       [address]       [place], [place]  [address]       Ph: [phone] or [phone]       Fax: [phone]   RxID:   [id number]    Orders Added: 1)  T- Capillary Blood Glucose [82948] 2)  T-Hgb A1C (in-house) [69678LF] 3)  Ophthalmology Referral  [Ophthalmology] 4)  DME Referral [DME] 5)  T-Urinalysis Dipstick only [81003QW] 6)  CXR- 2view [CXR] 7)  Diagnostic X-Ray/Fluoroscopy [Diagnostic X-Ray/Flu] 8)  Est. Patient Level IV [81017] 9)  Rheumatology Referral [Rheumatology] 10)  Tdap => 55yrs IM [90715] 11)  Admin 1st Vaccine [90471] 12)  Pneumococcal Vaccine [90732] 13)  Admin of Any Addtl Vaccine [90472] 14)  T-Urine Microalbumin w/creat. ratio [82043-[address]] 15)  T-Lipid Profile [80061-22930] 16)  T-Hepatic Function [80076-22960] 17)  T-Basic Metabolic Panel [80048-22910] 18)  T-CBC No Diff [85027-10000] 19)  T-Antinuclear Antib (ANA) [51025-85277] 20)  T-HIV Antibody  (Reflex) [82423-53614] 21)  T-Rheumatoid Factor [43154-00867] 22)  T-Sed Rate (Automated) [61950-93267]   Immunization History:  Influenza Immunization History:    Influenza:  fluvax mcr (11/15/2010)  Immunizations Administered:  Tetanus Vaccine:    Vaccine Type: Tdap    Site: left deltoid    Mfr: Sanofi Pasteur    Dose: 0.5 ml    Route: IM    Given by: Angelina Ok RN    Exp. Date: 03/02/2012    Lot #: [id number]    VIS given: 10/07/08 version given November 15, 2010.    Physician counseled: yes  Pneumonia Vaccine:    Vaccine Type: Pneumovax    Site: right deltoid    Mfr: Merck    Dose: 0.5 ml    Route: IM    Given by: Angelina Ok RN    Exp. Date: 01/07/2012    Lot #: [id number]    VIS given: 10/25/09 version given November 15, 2010.    Physician counseled: yes  Not Administered:    PPD Skin Test not placed due to: declined  Flu Vaccine Consent Questions:    Do you have a history of severe allergic reactions to this vaccine? no    Any prior history  of allergic reactions to egg and/or gelatin? no    Do you have a sensitivity to the preservative Thimersol? no    Do you have a past history of Guillan-Barre Syndrome? no    Do you currently have an acute febrile illness? no    Have you ever had a severe reaction to latex? no     Vaccine information given and explained to patient? yes  Vaccine Consent Questions for Tetanus and Pneumovax:    Do you have a history of severe allergic reactions to this vaccine? no    Any prior history of allergic reactions to egg and/or gelatin? no    Do you currently have an acute febrile illness? no    Have you ever had a severe reaction to latex? no    Patient is moderately or severely ill? no    Vaccine information given and explained to patient? yes   Immunization History:  Influenza Immunization History:    Influenza:  Fluvax MCR (11/15/2010)  Immunizations Administered:  Tetanus Vaccine:    Vaccine Type: Tdap    Site: left deltoid    Mfr: Sanofi Pasteur    Dose: 0.5 ml    Route: IM    Given by: Angelina Ok RN    Exp. Date: 03/02/2012    Lot #: [id number]    VIS given: 10/07/08 version given November 15, 2010.    Physician counseled: yes  Pneumonia Vaccine:    Vaccine Type: Pneumovax    Site: right deltoid    Mfr: Merck    Dose: 0.5 ml    Route: IM    Given by: Angelina Ok RN    Exp. Date: 01/07/2012    Lot #: [id number]    VIS given: 10/25/09 version given November 15, 2010.    Physician counseled: yes  Prevention & Chronic Care Immunizations   Influenza vaccine: Fluvax MCR  (11/15/2010)   Influenza vaccine due: 07/22/2011    Tetanus booster: 11/15/2010: Tdap   Tetanus booster due: 11/15/2020    Pneumococcal vaccine: Pneumovax  (11/15/2010)   Pneumococcal vaccine due: 08/13/2043  Other Screening   Smoking status: never  (11/15/2010)  Diabetes Mellitus   HgbA1C: 9.3  (11/15/2010)   Hemoglobin A1C due: 02/14/2011    Eye exam: Not documented   Diabetic eye exam action/deferral: Ophthalmology referral  (11/15/2010)    Foot exam: yes  (08/25/2010)   High risk foot: Yes  (08/25/2010)   Foot care education: Not documented   Foot exam due: 02/14/2011    Urine microalbumin/creatinine ratio: 86.7  (05/04/2009)   Urine microalbumin action/deferral:  Ordered   Urine microalbumin/cr due: 11/16/2011    Diabetes flowsheet reviewed?: Yes   Progress toward A1C goal: Unchanged    Stage of readiness to change (diabetes management): Preparation  Lipids   Total Cholesterol: 215  (03/02/2009)   Lipid panel action/deferral: Lipid Panel ordered   LDL: 106  (03/02/2009)   LDL Direct: Not documented   HDL: 60  (03/02/2009)   Triglycerides: 246  (03/02/2009)   Lipid panel due: 11/16/2011    SGOT (AST): 37  (05/04/2009)   BMP action: Ordered   SGPT (ALT): 58  (05/04/2009)   Alkaline phosphatase: 89  (05/04/2009)   Total bilirubin: 0.4  (05/04/2009)   Liver panel due: 11/16/2011    Lipid flowsheet reviewed?: Yes   Progress toward LDL goal: Unchanged    Stage of readiness to change (lipid management): Action  Hypertension   Last Blood Pressure: 124 / 85  (11/15/2010)  Serum creatinine: 0.75  (05/04/2009)   BMP action: Ordered   Serum potassium 4.1  (05/04/2009)   Basic metabolic panel due: 11/16/2011    Hypertension flowsheet reviewed?: Yes   Progress toward BP goal: At goal    Stage of readiness to change (hypertension management): Maintenance  Self-Management Support :   Personal Goals (by the next clinic visit) :     Personal A1C goal: 7  (08/25/2010)     Personal blood pressure goal: 130/80  (08/25/2010)     Personal LDL goal: 100  (08/25/2010)    Diabetes self-management support: Written self-care plan, Referred for DM self-management training  (11/15/2010)   Diabetes care plan printed   Last diabetes self-management training by diabetes educator: 01/28/2009    Hypertension self-management support: Written self-care plan  (11/15/2010)   Hypertension self-care plan printed.    Lipid self-management support: Written self-care plan  (11/15/2010)   Lipid self-care plan printed.   Nursing Instructions: Give Flu vaccine today Give tetanus booster today Give Pneumovax today Refer for screening diabetic eye exam (see  order) Refer for diabetes self-management training (see order)    Diabetes Self Management Training Referral Patient Name: Jason [name] Date Of Birth: 1976/01/06 MRN: [id number] Current Diagnosis:  KNEE PAIN, RIGHT, ACUTE (ICD-719.46) DYSURIA (ICD-788.1) DIABETES MELLITUS, TYPE II (ICD-250.00) RHEUMATOID ARTHRITIS (ICD-714.0)     POSITIVE PPD (ICD-795.5) ESSENTIAL HYPERTENSION (ICD-401.9) HYPERLIPIDEMIA (ICD-272.4) GERD (ICD-530.81) HEADACHE (ICD-784.0) HERNIA (ICD-553.9) CHEST PAIN, INTERMITTENT (ICD-786.50)     Management Training Needs:   Follow-up DSMT(2 hours/year)  Annual Follow-up MNT(2 hours/year)  Complicating Conditions:  HTN  Barriers:  Language    Laboratory Results   Urine Tests    Routine Urinalysis   Color: yellow Appearance: Hazy Glucose: >=1000   (Normal Range: Negative) Bilirubin: small   (Normal Range: Negative) Ketone: trace (5)   (Normal Range: Negative) Spec. Gravity: >=1.030   (Normal Range: 1.003-1.035) Blood: negative   (Normal Range: Negative) pH: 6.0   (Normal Range: 5.0-8.0) Protein: 100   (Normal Range: Negative) Nitrite: negative   (Normal Range: Negative)     Blood Tests   Date/Time Received: November 15, 2010 11:18 AM Date/Time Reported: Alric Quan  November 15, 2010 11:18 AM     HGBA1C: 9.3%   (Normal Range: Non-Diabetic - 3-6%   Control Diabetic - 6-8%) CBG Random:: 159mg /dL      Process Orders Check Orders Results:     [facility]: ABN not required for this insurance Tests Sent for requisitioning (November 16, 2010 12:56 PM):     11/15/2010: [facility] -- T-Urine Microalbumin w/creat. ratio [[id number]] (signed)     11/15/2010: [facility] -- T-Lipid Profile [phone] (signed)     11/15/2010: [facility] -- T-Hepatic Function [phone] (signed)     11/15/2010: [facility] -- T-Basic Metabolic Panel  [phone] (signed)     11/15/2010: [facility] -- T-CBC No Diff [[id number]] (signed)     11/15/2010: [facility] -- T-Antinuclear Antib (ANA) [[id number]] (signed)     11/15/2010: [facility] -- T-HIV Antibody  (Reflex) [[id number]] (signed)     11/15/2010: [facility] -- T-Rheumatoid Factor [phone] (signed)     11/15/2010: [facility] -- T-Sed Rate (Automated) [[id number]] (signed)

## 2010-12-22 NOTE — Progress Notes (Signed)
Summary: refill/gg  Phone Note Refill Request  on November 23, 2010 2:01 PM  Refills Requested: Medication #1:  HUMULIN 70/30 70-30 % SUSP Inject 32 units in the morning and 22 units at night.   Last Refilled: 09/19/2010 Lupita Leash  --  wanted you to see how this was written.  Send back to me   Method Requested: Electronic Initial call taken by: Merrie Roof RN,  November 23, 2010 2:02 PM  Follow-up for Phone Call        Note that current instructions say to take 2nd dose of humulin 70/30 at night: suggest specific instruction to take Humulin 70/30 15 minutes before BREAKFAST and 15 minutes before DINNER.  Follow-up by: Jamison Neighbor RD,CDE,  November 23, 2010 4:22 PM    New/Updated Medications: HUMULIN 70/30 70-30 % SUSP (INSULIN ISOPHANE & REGULAR) Inject 32 units before breakfast and 22 units before dinner Prescriptions: HUMULIN 70/30 70-30 % SUSP (INSULIN ISOPHANE & REGULAR) Inject 32 units before breakfast and 22 units before dinner  #1 vial x 6   Entered and Authorized by:   Laren Everts MD   Signed by:   Laren Everts MD on 11/25/2010   Method used:   Electronically to        [facility]  [phone]* (retail)       [address]       [place], [place]  [address]       Ph: [phone] or [phone]       Fax: [phone]   RxID:   [id number]

## 2010-12-22 NOTE — Progress Notes (Signed)
Summary: needs insulin & self monitoring supplies/dmr  Phone Note Call from Patient Call back at Home Phone [phone]   Caller: Patient Summary of Call: Patient called saying he needed insulin and strips to check blood sugar. Advised him he does not need a prescription to purchase Humulin 70/30 for 25.00 at [facility] or meter & strips for 20.00-30.00 there as well. Also advised patient that he can bring in financial information to [facility] and obtain insulin and meter and strips at [facility] for much less. 5$ for medicine and 5$ for meter & strips. Note nothing in registration about patient meeting with Artist. Patient verbalized understanding, however,await  interpreters to call and confirm patient understands.  Initial call taken by: Jamison Neighbor RD,CDE,  November 23, 2010 10:21 AM  Follow-up for Phone Call        called patient using language line to see if he was able to get insulin/strips/understood our. patient was not home.  Follow-up by: Jamison Neighbor RD,CDE,  November 28, 2010 9:24 AM  Additional Follow-up for Phone Call Additional follow up Details #1::        Left message using pacific interpreters for patient to call if he has not gotten his inuslin. Additional Follow-up by: Jamison Neighbor RD,CDE,  December 01, 2010 10:39 AM

## 2010-12-22 NOTE — Letter (Signed)
Summary: TWO WEEK GLUCOSE   TWO WEEK GLUCOSE   Imported By: Margie Billet 11/28/2010 11:14:15  _____________________________________________________________________  External Attachment:    Type:   Image     Comment:   External Document

## 2011-01-30 LAB — GLUCOSE, CAPILLARY: Glucose-Capillary: 159 mg/dL — ABNORMAL HIGH (ref 70–99)

## 2011-02-01 LAB — GLUCOSE, CAPILLARY
Glucose-Capillary: 447 mg/dL — ABNORMAL HIGH (ref 70–99)
Glucose-Capillary: 281 mg/dL — ABNORMAL HIGH (ref 70–99)

## 2011-02-02 ENCOUNTER — Other Ambulatory Visit: Payer: Self-pay | Admitting: *Deleted

## 2011-02-02 MED ORDER — PREDNISONE 10 MG PO TABS: 10.0000 mg | ORAL_TABLET | Freq: Every day | ORAL | Status: AC

## 2011-02-02 NOTE — Telephone Encounter (Signed)
[facility] pharmacy has pt on 1 tab by mouth twice daily.

## 2011-02-22 LAB — GLUCOSE, CAPILLARY: Glucose-Capillary: 266 mg/dL — ABNORMAL HIGH (ref 70–99)

## 2011-03-29 ENCOUNTER — Encounter: Payer: Self-pay | Admitting: Internal Medicine

## 2011-04-04 NOTE — Op Note (Signed)
Jason [name], Jason [name]          ACCOUNT NO.:  [id number]   MEDICAL RECORD NO.:  [id number]          PATIENT TYPE:  AMB   LOCATION:  DAY                          FACILITY:  [facility]   PHYSICIAN:  Ardeth Sportsman, MD     DATE OF BIRTH:  28-Dec-1975   DATE OF PROCEDURE:  DATE OF DISCHARGE:                               OPERATIVE REPORT   PRIMARY CARE PHYSICIAN:  Danne Harbor, MD at [facility].   CARDIOLOGIST:  Jesse Sans. Wall, MD, [facility].   SURGEON:  Ardeth Sportsman, MD.   ASSISTANT:  None.   PREOPERATIVE DIAGNOSIS:  Right inguinal hernia.   POSTOPERATIVE DIAGNOSIS:  Right inguinal hernias.   SPECIMENS:  None.   DRAINS:  None.   ESTIMATED BLOOD LOSS:  Less than 10 mL.   COMPLICATIONS:  None.   INDICATIONS:  Jason [name] is a 35 year old male who is very physically  active and has had a right groin bulge and discomfort consistent with a  right inguinal hernia.  He had no evidence of a left inguinal hernia.   Anatomy, embryology, abdominal formation and testicular migration was  explained.  Pathophysiology of inguinal herniation was discussed.  The  natural history risks were discussed.  Options were discussed and  recommendation was made for laparoscopic right inguinal hernia repair  with mesh.  The risks, benefits and alternatives were discussed.  Questions were answered and he agreed to proceed.   OPERATIVE FINDINGS:  He had a direct greater than indirect inguinal  hernias, pantaloon type.  There was no evidence of any obturator or  femoral hernias..   DESCRIPTION OF PROCEDURE:  Informed consent was confirmed.  The patient  underwent general anesthesia without any difficulty.  His glucose was  monitored given his diabetic state.  He voided just prior to going to  the operating room.  He underwent general anesthesia without any  difficulty.  He was placed supine with both arms tucked.  He had  sequential pressure devices active during the  entire case.  His abdomen,  perineum were prepped and the mons pubis was clipped, prepped and draped  in a sterile fashion.  Surgical time-out was performed.   Entry began to the abdominal wall through an infraumbilical curvilinear  incision.  A 12 mm  Hasson port was placed in the  retrorectal/preperitoneal plane posterior to the left rectus abdominis  muscle.  Pneumoperitoneum to 15 mmHg provided good intraabdominal wall  insufflation.  A 10 mm size 3 degrees scope was used help free the  peritoneum off the lower abdomen.  Enough working space was created such  that 5 mm ports were placed in the right and left mid abdomen.   Attention was turned toward the right lower quadrant.  The peritoneum  was freed off laterally.  In doing this tears there were a few small  tears in the peritoneum and they were closed using a 3-0 Vicryl stitch  using laparoscopic intracorporeal suturing.  Further dissection was done  until I could see a decent swath of  peritoneum going in the internal  ring with an indirect hernia.  There  was a large swath going into a  defect medial to the inferior epigastric, but superior to the inguinal  ligament consistent with a direct hernia defect as well.  This was  carefully skeletonized, freed off and reduced.  A window was made  between the anterior lateral bladder wall and the anteromedial pelvic  wall down to the level of the obturator foramen.   The hernia sac was peeled off as proximally off the cord structures as  possible taking care to avoid over-skeletonization of the vas and  arterial and venous supply of the spermatic cord.   A 15 x 15 cm ultra lightweight polypropylene (UltraPro) mesh was used  for each side.  The mesh was cut to half skull shape.  It was positioned  in the preperitoneal space such that a medial and inferior flap rested  in the true pelvis between the anterolateral bladder wall and the  anteromedial pelvic wall.  Mesh laid well  proximally, laterally,  superiorly and medially such there was 3 inches of circumference  coverage around both direct and indirect hernia defects.  The hernia  sacs were grasped and elevated cephalad.  Pneumoperitoneum was  evacuated.  The ports were removed.  The fascial defect was closed using  zero Vicryl stitch.  The 3-0 Monocryl was used to close skin.   The patient was extubated and sent to the recovery room in stable  condition.  Through social work we did have a Spanish interpreter that  discussed the case with him just prior to surgery and with his brothers  with the interpreter as well.  They expressed understanding &  appreciation.      Ardeth Sportsman, MD  Electronically Signed     SCG/MEDQ  D:  10/26/2008  T:  10/26/2008  Job:  [id number]   cc:   Jesse Sans. Wall, MD, FACC  [address]  [address]  [place]  [place] [address]   Danne Harbor, MD

## 2011-04-04 NOTE — Assessment & Plan Note (Signed)
[facility]                            CARDIOLOGY OFFICE NOTE   Jason, [name]                   MRN:          [id number]  DATE:09/11/2008                            DOB:          1976-09-03    HISTORY OF PRESENT ILLNESS:  I was asked by Jason [name] to  consult on Jason [name] with chest pain.   He describes intermittent chest pain that is substernal between his  breast.  It is described as a sharp pain.  It seems to be worse with  movement.   He says that he has no other associated symptoms except maybe getting a  little sweaty sometimes.  He has had no radiation of the discomfort.  No  associated shortness of breath.   He is currently 35 years of age.  He is a newly diagnosed diabetic who  has been fairly noncompliant run of his medicines as outlined in the  chart.  He has had a history of DKA, hospitalized on March 10, 2008.   He also has a history of rheumatoid arthritis followed by Jason [name].  He also has a positive PPD and has been concerned raised that he might  have some pericardial involvement.   PAST MEDICAL HISTORY:  In addition to the above, he is currently on  isoniazid 300 mg 1 tablet daily; NovoLog Mix 70/30, 70 - 30% suspension  15 units subcu q.a.m. and 10 units subcu at night; vitamin B6 50 mg per  day; prednisone 10 mg per day; pravastatin 10 mg per day; aspirin 81 mg  per day.   His past medical history is also significant for no known drug  allergies.   FAMILY HISTORY:  He has a family history of hypertension and diabetes in  a first-degree relative.   SOCIAL HISTORY:  He has never smoked.  He does not use alcohol, does not  use drugs.  He has exercised in the past but not recently.   REVIEW OF SYSTEMS:  He denies any hemoptysis, pleuritic chest pain.  No  dyspnea on exertion.  No fever.  No chills.  No sweats.  No syncope.  No  prolonged coughing, hemoptysis, abdominal pain, melena,  hematochezia,  hematuria, or incontinence.  He has had no syncope.  He has had no  peripheral edema.   PHYSICAL EXAMINATION:  GENERAL:  He is a well-developed, well-nourished  young man in no acute distress.  VITAL SIGNS:  His blood pressure is 116/66.  His pulse is 78 and  regular.  He is 63 inches tall.  He weighs 147 pounds.  HEENT:  Normocephalic and atraumatic.  PERRLA.  Facial symmetry is  normal.  Dentition is satisfactory.  NECK:  There is no JVD.  There is no Kussmaul's sign.  Thyroid is not  enlarged.  Trachea is midline.  No lymphadenopathy.  LUNGS:  Clear to auscultation and percussion.  There is no dullness in  the bases.  There is no rub.  HEART:  A nondisplaced PMI.  Normal S1 and S2.  No rub.  S2 splits  physiologically.  There is tenderness at the second and  third rib level  of the sternum.  It is worse when he turns his chest.  ABDOMEN:  Soft.  Good bowel sounds.  No midline bruit.  No hepatomegaly.  EXTREMITIES:  No sinus clubbing or edema.  Pulses are intact.  There is  no sign of DVT.  NEUROLOGIC:  Intact.  SKIN:  Unremarkable.   His EKG from the [facility]patient [facility] and from today is basically normal  except for perhaps some minimal early repolarization on his EKG July 09, 2008.  It seems to have resolved now.  There is no sign of any low  voltage or electrical alternans.   ASSESSMENT:  1. Costochondritis.  2. History of tuberculosis and rheumatoid arthritis, rule out      pericardial involvement.  His EKG on July 09, 2008 is a little      suspect and now has normalized.  I doubt he has any active      inflammation ongoing regardless because of being on prednisone.   PLAN:  1. Continue current medication.  2. Heat.  3. Nonsteroidal antiinflammatory drugs.  4. Avoid exacerbating activity.  5. 2-D echocardiogram to rule out any pericardial involvement.  6. Reassurance this is noncoronary.     Jason [name], [facility]  Electronically Signed     TCW/MedQ  DD: 09/11/2008  DT: 09/12/2008  Job #: [id number]   cc:   Jason Everts, [name]

## 2011-04-04 NOTE — H&P (Signed)
Jason [name], GAUS          ACCOUNT NO.:  [id number]   MEDICAL RECORD NO.:  [id number]          PATIENT TYPE:  INP   LOCATION:  6742                         FACILITY:  [facility]   PHYSICIAN:  Della Goo, M.D. DATE OF BIRTH:  12/15/1975   DATE OF ADMISSION:  03/10/2008  DATE OF DISCHARGE:                              HISTORY & PHYSICAL   PRIMARY CARE PHYSICIAN:  Unassigned.   CHIEF COMPLAINT:  Nausea, vomiting.   HISTORY OF PRESENT ILLNESS:  This is 35 year old male who presents to  the emergency department with complaints of feeling sick for 2 weeks  having nausea and vomiting along with polyuria and polydipsia.  The  patient reports being previously healthy.  He was evaluated in the  emergency department and found to have an elevated blood sugar of 581 on  initial intake.  He was started on treatment and referred for admission.   PAST MEDICAL HISTORY:  None.   PAST SURGICAL HISTORY:  None.   ALLERGIES:  NO KNOWN DRUG ALLERGIES.   SOCIAL HISTORY:  The patient is a nonsmoker, nondrinker.   FAMILY HISTORY:  Noncontributory.   REVIEW OF SYSTEMS:  Pertinent for mentioned above.  The patient denies  having any chest pain.  Does report having shortness of breath and  lightheadedness.  He denies having any syncope.  He denies having weight  loss.  He does report having decreased appetite and denies having any  diarrhea.  He denies having any hematemesis, hematochezia or melena.   PHYSICAL EXAMINATION FINDINGS:  GENERAL:  This is a 35 year old well-  nourished, well-developed male in discomfort but no acute distress.  VITAL SIGNS:  Temperature is 99.1, blood pressure 135/95, heart rate  105, respirations 18, O2 saturations 97-98%.  HEENT:  Examination normocephalic, atraumatic.  Pupils equally round  reactive to light.  Extraocular muscles are intact.  There is no scleral  icterus.  Funduscopic benign.  Oropharynx is clear mucosa is dry.  There  is no exudation.  NECK:   Supple, full range of motion.  No thyromegaly, adenopathy,  jugular venous distention.  CARDIOVASCULAR: Regular rate and rhythm.  No murmurs, gallops or rubs.  LUNGS:  Clear to auscultation bilaterally.  ABDOMEN:  Positive bowel sounds, soft, nontender, nondistended.  There  is no hepatosplenomegaly.  EXTREMITIES:  Without cyanosis, clubbing or edema.  NEUROLOGIC:  Examination nonfocal.   LABORATORY STUDIES:  White blood cell count 10.6, hemoglobin 16.6,  hematocrit 49.2, platelets 307.  Sodium 125, potassium 4.5, chloride 90,  bicarb 14, BUN 22, creatinine 1.01 and glucose 581, lipase 43, albumin  3.5 AST 14, ALT 16 and alkaline phosphatase 80.  Urinalysis glucose  greater than 1000 and greater than 80 ketones.   ASSESSMENT:  This is a 35 year old male being admitted with:  1. Diabetic ketoacidosis.  2. New type 1 diabetes.  3. Dehydration.   PLAN:  The patient will be admitted and placed on the IV insulin drip  protocol and IV fluids for rehydration and fluid resuscitation.  His  electrolytes will be monitored and corrected.  The patient will also be  n.p.o. while he is on the IV insulin  drip.  His diet will be advanced to  a diabetic diet once he has been discontinued from the insulin drip.  The patient will be further assessed for his other diabetic medication  therapies, and while he is hospitalized, he will receive diabetic  education and counseling.  DVT and GI prophylaxis have also been ordered  along with antiemetic therapy as needed.      Della Goo, M.D.  Electronically Signed     HJ/MEDQ  D:  03/11/2008  T:  03/11/2008  Job:  [id number]

## 2011-04-04 NOTE — Discharge Summary (Signed)
Jason [name], Jason [name]          ACCOUNT NO.:  [id number]   MEDICAL RECORD NO.:  [id number]          PATIENT TYPE:  INP   LOCATION:  6742                         FACILITY:  [facility]   PHYSICIAN:  Michaelyn Barter, M.D. DATE OF BIRTH:  20-Aug-1976   DATE OF ADMISSION:  03/10/2008  DATE OF DISCHARGE:  03/13/2008                               DISCHARGE SUMMARY   The patient's primary care doctor is unassigned.   FINAL DIAGNOSES:  1. Diabetic ketoacidosis.  2. Newly diagnosed diabetes mellitus.  3. Hypokalemia.  4. Nausea, vomiting.  5. Dehydration.  6. Hyperlipidemia.   HISTORY OF PRESENT ILLNESS:  Jason [name] 35 year old gentleman who  indicated that he had been feeling ill for 2 weeks prior to this  admission.  He had been experiencing nausea, vomiting, as well as  polyuria, and polydipsia.   For past medical history, please see that dictated by Dr. Della Goo.   HOSPITAL COURSE:  1. DKA.  The patient's glucose was noted to be 581 when he initially      presented to the emergency department.  He was started on the      Glucommander and admitted to the medicine floor.  His sugars      gradually improved and subsequently he was titrated off the      Glucommander on to subcutaneous Lantus insulin.  2. Newly diagnosed diabetes mellitus.  The patient's DKA was      associated with new onset diabetes mellitus.  The patient did have      hemoglobin A1c completed which was found to be 9.9.  The patient      received diabetic education throughout his hospital course.  3. Hyperlipidemia.  A fasting lipid profile demonstrated a LDL of 121,      triglycerides of 124, and cholesterol of 174.  Diet and exercise      have been recommended to the patient.  4. Hypokalemia.  Potassium supplementation has been provided to the      patient during this hospital course.  5. Intractable nausea and vomiting.  This is related to the patient's      DKA and newly diagnosed diabetes state.   P.r.n. antiemetics were      provided to the patient, and over the course of his      hospitalization, there has been resolution of these symptoms.  6. Dehydration.  This resulted from the intractable nausea and      vomiting that the patient has had.  With aggressive fluid      resuscitation, the patient's state of dehydration has resolved.  On      the date of discharge, the patient indicates that he feels      significantly better.  His temperature is 97.8, heart rate 60,      respirations 20, blood pressure 119/62, O2 sat 98% on room air.      The decision has been made to discharge the patient home from the      hospital.   His medications at the time of discharge will consist of the following;  1. Lantus insulin 18 units subcu nightly.  2. Protonix 40 mg 1 tablet p.o. daily.   The patient will be instructed to follow-up with his primary care  physician within the next 1-2 weeks and to take all of his medications  as directed.      Michaelyn Barter, M.D.  Electronically Signed     OR/MEDQ  D:  03/13/2008  T:  03/13/2008  Job:  [id number]

## 2011-06-01 ENCOUNTER — Other Ambulatory Visit: Payer: Self-pay | Admitting: Dietician

## 2011-06-02 ENCOUNTER — Other Ambulatory Visit: Payer: Self-pay | Admitting: *Deleted

## 2011-06-02 MED ORDER — PRAVASTATIN SODIUM 10 MG PO TABS: 10.0000 mg | ORAL_TABLET | Freq: Every day | ORAL | Status: DC

## 2011-06-02 MED ORDER — PREDNISONE 10 MG PO TABS: 10.0000 mg | ORAL_TABLET | Freq: Every day | ORAL | Status: DC

## 2011-06-02 MED ORDER — RANITIDINE HCL 150 MG PO TABS: 150.0000 mg | ORAL_TABLET | Freq: Two times a day (BID) | ORAL | Status: DC

## 2011-06-02 MED ORDER — GLUCOSE BLOOD VI STRP: ORAL_STRIP | Status: DC

## 2011-06-02 MED ORDER — INSULIN NPH ISOPHANE & REGULAR (70-30) 100 UNIT/ML ~~LOC~~ SUSP: SUBCUTANEOUS | Status: DC

## 2011-06-02 MED ORDER — ADALIMUMAB 40 MG/0.8ML ~~LOC~~ KIT: 40.0000 mg | PACK | Freq: Once | SUBCUTANEOUS | Status: DC

## 2011-06-02 MED ORDER — ACCU-CHEK SOFTCLIX LANCETS MISC: Status: DC

## 2011-06-02 MED ORDER — METFORMIN HCL ER 500 MG PO TB24: 1000.0000 mg | ORAL_TABLET | Freq: Two times a day (BID) | ORAL | Status: DC

## 2011-06-02 NOTE — Telephone Encounter (Signed)
Patient needs an OV has not been seen since 10/2010. Will only give one month.

## 2011-06-05 NOTE — Telephone Encounter (Signed)
Spoke to triage nurse who has taken care of patient request.

## 2011-06-16 ENCOUNTER — Telehealth: Payer: Self-pay | Admitting: Dietician

## 2011-06-16 NOTE — Telephone Encounter (Signed)
Patient called for medicine. Relayed to him that some refills were done on 06/02/11 for a 1 month supply and he has an appointment on 07/03/11.

## 2011-07-02 ENCOUNTER — Encounter: Payer: Self-pay | Admitting: Internal Medicine

## 2011-07-03 ENCOUNTER — Ambulatory Visit: Payer: Self-pay | Admitting: Internal Medicine

## 2011-07-10 ENCOUNTER — Ambulatory Visit: Payer: Self-pay | Admitting: Internal Medicine

## 2011-07-10 ENCOUNTER — Encounter: Payer: Self-pay | Admitting: Internal Medicine

## 2011-07-13 ENCOUNTER — Encounter: Payer: Self-pay | Admitting: Internal Medicine

## 2011-08-15 LAB — HEPATIC FUNCTION PANEL
ALT: 16
AST: 14
Albumin: 3.5
Alkaline Phosphatase: 80
Bilirubin, Direct: <0.1
Total Bilirubin: 1.3 — ABNORMAL HIGH
Total Protein: 6.8

## 2011-08-15 LAB — BASIC METABOLIC PANEL
BUN: 8
CO2: 14 — ABNORMAL LOW
CO2: 22
Calcium: 8.2 — ABNORMAL LOW
Calcium: 8.4
Chloride: 90 — ABNORMAL LOW
Creatinine, Ser: 0.7
GFR calc Af Amer: >60
GFR calc Af Amer: >60
GFR calc non Af Amer: >60
GFR calc non Af Amer: >60
Glucose, Bld: 204 — ABNORMAL HIGH
Glucose, Bld: 231 — ABNORMAL HIGH
Potassium: 4.5
Sodium: 125 — ABNORMAL LOW

## 2011-08-15 LAB — DIFFERENTIAL
Basophils Relative: 0
Eosinophils Absolute: 0.2
Monocytes Absolute: 0.4
Monocytes Relative: 4
Neutro Abs: 8.6 — ABNORMAL HIGH

## 2011-08-15 LAB — URINALYSIS, ROUTINE W REFLEX MICROSCOPIC
Bilirubin Urine: NEGATIVE
Glucose, UA: >1000 — AB
Hgb urine dipstick: NEGATIVE
Nitrite: NEGATIVE
Specific Gravity, Urine: 1.038 — ABNORMAL HIGH
pH: 5.5

## 2011-08-15 LAB — URINE MICROSCOPIC-ADD ON

## 2011-08-15 LAB — CBC
Hemoglobin: 16.6
MCHC: 33.8
MCV: 84.8
Platelets: 184
RBC: 5.8
RDW: 12.4

## 2011-08-15 LAB — CALCIUM: Calcium: 8.3 — ABNORMAL LOW

## 2011-08-15 LAB — LIPASE, BLOOD: Lipase: 43

## 2011-08-15 LAB — HEMOGLOBIN A1C
Hgb A1c MFr Bld: 9.9 — ABNORMAL HIGH
Mean Plasma Glucose: 275

## 2011-08-15 LAB — PHOSPHORUS: Phosphorus: 2.9

## 2011-08-15 LAB — MAGNESIUM: Magnesium: 2.1

## 2011-08-15 LAB — LIPID PANEL: Cholesterol: 174

## 2011-08-22 ENCOUNTER — Other Ambulatory Visit: Payer: Self-pay | Admitting: *Deleted

## 2011-08-22 ENCOUNTER — Telehealth: Payer: Self-pay | Admitting: Dietician

## 2011-08-22 MED ORDER — GLUCOSE BLOOD VI STRP: ORAL_STRIP | Status: DC

## 2011-08-22 MED ORDER — ACCU-CHEK SOFTCLIX LANCETS MISC: Status: DC

## 2011-08-22 MED ORDER — PRAVASTATIN SODIUM 10 MG PO TABS: 10.0000 mg | ORAL_TABLET | Freq: Every day | ORAL | Status: DC

## 2011-08-22 NOTE — Telephone Encounter (Signed)
Patient called back to get an appointment sooner- was scheduled for November 2nd. Transferred him to scheduler to change this appointment to one either this week or next.

## 2011-08-22 NOTE — Telephone Encounter (Signed)
Will refill one month -then he needs to come in.

## 2011-08-22 NOTE — Telephone Encounter (Signed)
At first patient asked for insulin and metformin refills, then said he Needs refills on all medicines and a doctor appointment. Transferred call to scheduler and will Route note to triage/refill nurse.

## 2011-08-25 LAB — GLUCOSE, CAPILLARY
Glucose-Capillary: 158 mg/dL — ABNORMAL HIGH (ref 70–99)
Glucose-Capillary: 171 mg/dL — ABNORMAL HIGH (ref 70–99)
Glucose-Capillary: 189 mg/dL — ABNORMAL HIGH (ref 70–99)
Glucose-Capillary: 197 mg/dL — ABNORMAL HIGH (ref 70–99)
Glucose-Capillary: 246 mg/dL — ABNORMAL HIGH (ref 70–99)
Glucose-Capillary: 268 mg/dL — ABNORMAL HIGH (ref 70–99)
Glucose-Capillary: 328 mg/dL — ABNORMAL HIGH (ref 70–99)
Glucose-Capillary: 425 mg/dL — ABNORMAL HIGH (ref 70–99)

## 2011-08-25 LAB — BASIC METABOLIC PANEL
CO2: 26 mEq/L (ref 19–32)
Calcium: 8.6 mg/dL (ref 8.4–10.5)
Calcium: 9.8 mg/dL (ref 8.4–10.5)
Chloride: 108 mEq/L (ref 96–112)
GFR calc Af Amer: >60 mL/min (ref >60)
GFR calc Af Amer: >60 mL/min (ref >60)
GFR calc non Af Amer: >60 mL/min (ref >60)
Glucose, Bld: 173 mg/dL — ABNORMAL HIGH (ref 70–99)
Potassium: 3.7 mEq/L (ref 3.5–5.1)
Potassium: 3.9 mEq/L (ref 3.5–5.1)
Sodium: 135 mEq/L (ref 135–145)
Sodium: 142 mEq/L (ref 135–145)

## 2011-08-25 LAB — HEMOGLOBIN A1C: Mean Plasma Glucose: 275 mg/dL

## 2011-08-25 LAB — GLUCOSE, RANDOM: Glucose, Bld: 429 mg/dL — ABNORMAL HIGH (ref 70–99)

## 2011-08-25 LAB — HEMOGLOBIN AND HEMATOCRIT, BLOOD: Hemoglobin: 15.7 g/dL (ref 13.0–17.0)

## 2011-08-28 ENCOUNTER — Ambulatory Visit (INDEPENDENT_AMBULATORY_CARE_PROVIDER_SITE_OTHER): Payer: Self-pay | Admitting: Internal Medicine

## 2011-08-28 ENCOUNTER — Encounter: Payer: Self-pay | Admitting: Internal Medicine

## 2011-08-28 VITALS — BP 146/89 | HR 82 | Temp 98.9°F | Ht 62.0 in | Wt 154.6 lb

## 2011-08-28 DIAGNOSIS — E119 Type 2 diabetes mellitus without complications: Secondary | ICD-10-CM

## 2011-08-28 DIAGNOSIS — Z23 Encounter for immunization: Secondary | ICD-10-CM

## 2011-08-28 DIAGNOSIS — Z299 Encounter for prophylactic measures, unspecified: Secondary | ICD-10-CM

## 2011-08-28 DIAGNOSIS — F329 Major depressive disorder, single episode, unspecified: Secondary | ICD-10-CM

## 2011-08-28 LAB — POCT GLYCOSYLATED HEMOGLOBIN (HGB A1C): Hemoglobin A1C: 10.2

## 2011-08-28 LAB — GLUCOSE, CAPILLARY: Glucose-Capillary: 167 mg/dL — ABNORMAL HIGH (ref 70–99)

## 2011-08-28 MED ORDER — INSULIN NPH ISOPHANE & REGULAR (70-30) 100 UNIT/ML ~~LOC~~ SUSP: SUBCUTANEOUS | Status: DC

## 2011-08-28 MED ORDER — FLUOXETINE HCL 20 MG PO CAPS: 20.0000 mg | ORAL_CAPSULE | Freq: Every day | ORAL | Status: DC

## 2011-08-28 MED ORDER — INSULIN SYRINGES (DISPOSABLE) U-100 1 ML MISC: 1.0000 | Freq: Two times a day (BID) | Status: DC

## 2011-08-28 MED ORDER — METFORMIN HCL ER 500 MG PO TB24: 1000.0000 mg | ORAL_TABLET | Freq: Two times a day (BID) | ORAL | Status: DC

## 2011-08-28 NOTE — Telephone Encounter (Signed)
Patient has appointment today

## 2011-08-28 NOTE — Patient Instructions (Signed)
Please, take ALL medications as prescribed. Please, increase Insluin dose to 35 Units in morning and 25 untis in the evening before meals. Please, pick up a prescription for Fluoxetine (for depression). Call with any questions and please, make an appointment with [facility] and in 4 weeks with me.

## 2011-08-29 LAB — MICROALBUMIN / CREATININE URINE RATIO
Microalb Creat Ratio: 323 mg/g — ABNORMAL HIGH (ref 0.0–30.0)
Microalb, Ur: 62.05 mg/dL — ABNORMAL HIGH (ref 0.00–1.89)

## 2011-08-29 NOTE — Progress Notes (Signed)
  Subjective:    Patient ID: Jason [name], male    DOB: 02/14/1976, 35 y.o.   MRN: [id number]  HPI  1. Follow up appointment on DM, insulin dependent. Patient reports that He takes "approximately" 30 units of  Insulin 70/30 in am and 20 inits  Qpm. Denies any hypoglycemia. States that his CBG's run as high as 500; skips meals; eats diet high in saturated fats and carbohydrates. Reports that he "does not understand why his diabetes is not controlled." 2. RA. No flares. Patient is off DMARD therapy. 3. Depression. Patient admits to being depressed; denies SI/HI or mania.  Review of Systems  Constitutional: Positive for activity change. Negative for fever, chills, diaphoresis, appetite change, fatigue and unexpected weight change.  HENT: Negative for neck pain.   Eyes: Negative for photophobia and visual disturbance.  Respiratory: Negative.   Cardiovascular: Negative.   Gastrointestinal: Negative.   Genitourinary: Negative.   Musculoskeletal: Negative.   Neurological: Negative.   Hematological: Negative.   Psychiatric/Behavioral: Positive for dysphoric mood and decreased concentration. Negative for suicidal ideas, hallucinations, behavioral problems, confusion, sleep disturbance, self-injury and agitation. The patient is not nervous/anxious and is not hyperactive.        Objective:   Physical Exam  Vitals: T= , BP=, HR=, RR=, O2 Sat= on RA. General: alert, well-developed, and cooperative to examination.  Head: normocephalic and atraumatic.  Eyes: vision grossly intact, pupils equal, pupils round, pupils reactive to light, no injection and anicteric.  Mouth: pharynx pink and moist, no erythema, and no exudates.  Neck: supple, full ROM, no thyromegaly, no JVD, and no carotid bruits.  Lungs: normal respiratory effort, no accessory muscle use, normal breath sounds, no crackles, and no wheezes. Heart: normal rate, regular rhythm, no murmur, no gallop, and no rub.  Abdomen: soft,  non-tender, normal bowel sounds, no distention, no guarding, no rebound tenderness, no hepatomegaly, and no splenomegaly.  Msk: no joint swelling, no joint warmth, and no redness over joints.  Pulses: 2+ DP/PT pulses bilaterally Extremities: No cyanosis, clubbing, edema Neurologic: alert & oriented X3, cranial nerves II-XII intact, strength normal in all extremities, sensation intact to light touch, and gait normal.  Skin: turgor normal and no rashes.  Psych: Oriented X3, memory intact for recent and remote, normally interactive, good eye contact, not anxious appearing. He is depressed appearing.         Assessment & Plan:  1. DM, insulin-dependent. Uncontrolled due to lack of adherence with his therapy. I believe this is related to a depression. -Patient agreed to start Tx with SSRI's -. Side-effects explained; mode of action explained. Patient is to call if maniac/SI or HI. -Instructed to restart Metformin -Instructed to use PRECISE amount of insulin prescribed: increased to 35 Units SQ q am and 25 units SQ q pm.  -signs of hypoglycemia and DKA reviewed with the patient. -Refused  Addition of  ACEI  For now. -Diet, exercise reiterate to the patient. -follow up in 4 weeks.

## 2011-09-22 ENCOUNTER — Encounter: Payer: Self-pay | Admitting: Ophthalmology

## 2011-09-22 NOTE — Progress Notes (Deleted)
Subjective:   Patient ID: Jason [name] male   DOB: [birth year] 35 y.o.   MRN: [id number]  HPI: Mr.[name] is a 35 y.o.     Past Medical History  Diagnosis Date  . DKA (diabetic ketoacidoses)     hospitalized in 03/10/08  . Diabetes mellitus     diagnosed in april when admitted to [facility] with blood glucose of 581. HbA1C was 9.9  . Rheumatoid arthritis     managed by Dr. Pollyann Savoy. sronegative. Dr. 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  . Latent tuberculosis infection     PPD test measured 12 mm on 05/25/08. CXR on 06/03/08 showed no active disease or adenopathy. Started to take isoniazid 300 mg daily. on 06/11/08 and will  take medication  along with Vit  B6 for 9 months.   Current Outpatient Prescriptions  Medication Sig Dispense Refill  . ACCU-CHEK SOFTCLIX LANCETS lancets Use as instructed  100 each  0  . aspirin 81 MG tablet Take 81 mg by mouth daily.        Marland Kitchen FLUoxetine (PROZAC) 20 MG capsule Take 1 capsule (20 mg total) by mouth daily.  30 capsule  2  . glucose blood (ACCU-CHEK AVIVA) test strip Check blood glucose before breakfast and before bedtime for 7 days, then check once a day before breakfast  100 each  0  . insulin NPH-insulin regular (HUMULIN 70/30) (70-30) 100 UNIT/ML injection Inject 35 units before breakfast and 25 units before dinner.  20 mL  11  . Insulin Syringes, Disposable, U-100 1 ML MISC 1 Device by Does not apply route 2 (two) times daily before a meal.  120 each  11  . metFORMIN (GLUCOPHAGE-XR) 500 MG 24 hr tablet Take 2 tablets (1,000 mg total) by mouth 2 (two) times daily. Take 3 tablets by mouth once a day.  120 tablet  0  . pravastatin (PRAVACHOL) 10 MG tablet Take 1 tablet (10 mg total) by mouth daily.  30 tablet  0   Family History  Problem Relation Age of Onset  . Diabetes Cousin   . Hypertension Cousin    History   Social History  . Marital Status: Single    Spouse Name:  N/A    Number of Children: N/A  . Years of Education: N/A   Social History Main Topics  . Smoking status: Never Smoker   . Smokeless tobacco: Not on file  . Alcohol Use: No  . Drug Use: No  . Sexually Active:    Social History Narrative  . No narrative on file   Review of Systems: Constitutional: Denies fever, chills, diaphoresis, appetite change and fatigue.  HEENT: Denies photophobia, eye pain, redness, hearing loss, ear pain, congestion, sore throat, rhinorrhea, sneezing, mouth sores, trouble swallowing, neck pain, neck stiffness and tinnitus.   Respiratory: Denies SOB, DOE, cough, chest tightness,  and wheezing.   Cardiovascular: Denies chest pain, palpitations and leg swelling.  Gastrointestinal: Denies nausea, vomiting, abdominal pain, diarrhea, constipation, blood in stool and abdominal distention.  Genitourinary: Denies dysuria, urgency, frequency, hematuria, flank pain and difficulty urinating.  Musculoskeletal: Denies myalgias, back pain, joint swelling, arthralgias and gait problem.  Skin: Denies pallor, rash and wound.  Neurological: Denies dizziness, seizures, syncope, weakness, light-headedness, numbness and headaches.  Hematological: Denies adenopathy. Easy bruising, personal or family bleeding history  Psychiatric/Behavioral: Denies suicidal ideation, mood changes, confusion, nervousness, sleep disturbance and agitation  Objective:  Physical Exam:  There were no vitals filed for this visit. Constitutional: Vital signs reviewed.  Patient is a well-developed and well-nourished man in no acute distress and cooperative with exam. Alert and oriented x3.  Head: Normocephalic and atraumatic Ear: TM normal bilaterally Mouth: no erythema or exudates, MMM Eyes: PERRL, EOMI, conjunctivae normal, No scleral icterus.  Neck: Supple, Trachea midline normal ROM, No JVD, mass, thyromegaly, or carotid bruit present.  Cardiovascular: RRR, S1 normal, S2 normal, no MRG, pulses symmetric  and intact bilaterally Pulmonary/Chest: CTAB, no wheezes, rales, or rhonchi Abdominal: Soft. Non-tender, non-distended, bowel sounds are normal, no masses, organomegaly, or guarding present.  GU: no CVA tenderness Musculoskeletal: No joint deformities, erythema, or stiffness, ROM full and no nontender Hematology: no cervical, inginal, or axillary adenopathy.  Neurological: A&O x3, Strenght is normal and symmetric bilaterally, cranial nerve II-XII are grossly intact, no focal motor deficit, sensory intact to light touch bilaterally.  Skin: Warm, dry and intact. No rash, cyanosis, or clubbing.  Psychiatric: Normal mood and affect. speech and behavior is normal. Judgment and thought content normal. Cognition and memory are normal.   Assessment & Plan:

## 2011-09-22 NOTE — Progress Notes (Signed)
  Subjective:    Patient ID: Jason [name], male    DOB: [birth year], 35 y.o.   MRN: [id number]  HPI    Review of Systems     Objective:   Physical Exam        Assessment & Plan:

## 2011-09-25 NOTE — Progress Notes (Signed)
This encounter was created in error - please disregard.

## 2011-11-09 ENCOUNTER — Other Ambulatory Visit: Payer: Self-pay | Admitting: *Deleted

## 2011-11-09 DIAGNOSIS — E119 Type 2 diabetes mellitus without complications: Secondary | ICD-10-CM

## 2011-11-09 DIAGNOSIS — F329 Major depressive disorder, single episode, unspecified: Secondary | ICD-10-CM

## 2011-11-09 DIAGNOSIS — Z299 Encounter for prophylactic measures, unspecified: Secondary | ICD-10-CM

## 2011-11-09 NOTE — Telephone Encounter (Signed)
[facility] request a change to Novolin brand as it's there preferred brand

## 2011-11-10 MED ORDER — INSULIN NPH ISOPHANE & REGULAR (70-30) 100 UNIT/ML ~~LOC~~ SUSP: SUBCUTANEOUS | Status: DC

## 2011-11-16 ENCOUNTER — Telehealth: Payer: Self-pay | Admitting: *Deleted

## 2011-11-16 MED ORDER — INSULIN NPH ISOPHANE & REGULAR (70-30) 100 UNIT/ML ~~LOC~~ SUSP: SUBCUTANEOUS | Status: DC

## 2011-11-16 NOTE — Telephone Encounter (Signed)
Pharmacy request change from Humulin (NPH) which cost $70.00 to Novolin brand which cost $24.88. Thanks

## 2011-11-16 NOTE — Telephone Encounter (Signed)
Script changed and e-script sent

## 2011-11-16 NOTE — Telephone Encounter (Signed)
Request change to Novolin 70/30.

## 2012-01-01 ENCOUNTER — Telehealth: Payer: Self-pay | Admitting: Dietician

## 2012-01-01 NOTE — Telephone Encounter (Signed)
Tried returning patient call and man answering phone did not speak English or seem to know [place]. Will try again later using language line

## 2012-01-02 NOTE — Telephone Encounter (Signed)
Tried again today using different number from voicemail message. left message.

## 2012-01-03 NOTE — Telephone Encounter (Signed)
Returned call- patient says he got an appointment because he has been sick for 2 weeks, but he cannot get a ride at the time the appointment has been scheduled. He is requesting a morning appointment on 01/04/12. Will forward his request to triage nurse.

## 2012-01-04 ENCOUNTER — Ambulatory Visit (INDEPENDENT_AMBULATORY_CARE_PROVIDER_SITE_OTHER): Payer: Self-pay | Admitting: Internal Medicine

## 2012-01-04 ENCOUNTER — Encounter: Payer: Self-pay | Admitting: Internal Medicine

## 2012-01-04 ENCOUNTER — Ambulatory Visit (HOSPITAL_COMMUNITY)
Admission: RE | Admit: 2012-01-04 | Discharge: 2012-01-04 | Disposition: A | Discharge status: Home / Self Care | Payer: Self-pay | Source: Ambulatory Visit | Attending: Internal Medicine | Admitting: Internal Medicine

## 2012-01-04 VITALS — BP 121/78 | HR 95 | Temp 97.5°F | Ht 61.0 in | Wt 140.5 lb

## 2012-01-04 DIAGNOSIS — R059 Cough, unspecified: Secondary | ICD-10-CM | POA: Insufficient documentation

## 2012-01-04 DIAGNOSIS — Z79899 Other long term (current) drug therapy: Secondary | ICD-10-CM

## 2012-01-04 DIAGNOSIS — E119 Type 2 diabetes mellitus without complications: Secondary | ICD-10-CM

## 2012-01-04 DIAGNOSIS — R05 Cough: Secondary | ICD-10-CM

## 2012-01-04 DIAGNOSIS — M069 Rheumatoid arthritis, unspecified: Secondary | ICD-10-CM

## 2012-01-04 LAB — COMPREHENSIVE METABOLIC PANEL
Albumin: 4.2 g/dL (ref 3.5–5.2)
Alkaline Phosphatase: 145 U/L — ABNORMAL HIGH (ref 39–117)
BUN: 12 mg/dL (ref 6–23)
CO2: 22 mEq/L (ref 19–32)
Calcium: 9.4 mg/dL (ref 8.4–10.5)
Glucose, Bld: 450 mg/dL — ABNORMAL HIGH (ref 70–99)
Potassium: 4.2 mEq/L (ref 3.5–5.3)
Total Protein: 7.3 g/dL (ref 6.0–8.3)

## 2012-01-04 LAB — CBC WITH DIFFERENTIAL/PLATELET
Basophils Absolute: 0 10*3/uL (ref 0.0–0.1)
Eosinophils Absolute: 0.7 10*3/uL (ref 0.0–0.7)
Eosinophils Relative: 10 % — ABNORMAL HIGH (ref 0–5)
Lymphocytes Relative: 24 % (ref 12–46)
MCV: 88.8 fL (ref 78.0–100.0)
Neutrophils Relative %: 59 % (ref 43–77)
Platelets: 254 10*3/uL (ref 150–400)
RDW: 12.3 % (ref 11.5–15.5)
WBC: 7 10*3/uL (ref 4.0–10.5)

## 2012-01-04 LAB — POCT GLYCOSYLATED HEMOGLOBIN (HGB A1C): Hemoglobin A1C: 11.5

## 2012-01-04 MED ORDER — SULFAMETHOXAZOLE-TRIMETHOPRIM 800-160 MG PO TABS: 1.0000 | ORAL_TABLET | Freq: Two times a day (BID) | ORAL | Status: AC

## 2012-01-04 MED ORDER — SULFAMETHOXAZOLE-TRIMETHOPRIM 800-160 MG PO TABS: 1.0000 | ORAL_TABLET | Freq: Two times a day (BID) | ORAL | Status: DC

## 2012-01-04 MED ORDER — ALBUTEROL SULFATE HFA 108 (90 BASE) MCG/ACT IN AERS: 2.0000 | INHALATION_SPRAY | Freq: Four times a day (QID) | RESPIRATORY_TRACT | Status: DC | PRN

## 2012-01-04 NOTE — Assessment & Plan Note (Addendum)
Cough, bloody sputum, fever, chills, sore throat and ear congestion, shortness of breath with exertion for past 2 weeks No medications, sick contacts No similar symptoms in past  Condition stable since onset  Plan - cbc, cxr to r/o pneumonia - cmet to r/o DKA given uncontrolled DM (hbA1c 11.1)  - on prednsione for RA, continue  ** X-ray normal. Most likely acute bronchitis. Given prolonged course, prescribe Bactrim for 7 days. Followup in one week if not better. - albuterol sample for SOB - antibiotic Rx after looking at xray and labs - follow up in 1 weeks, instruction to come to ER if worse of s/s of DKA

## 2012-01-04 NOTE — Patient Instructions (Addendum)
Followup in one week.  Bronquitis aguda  (Acute Bronchitis)  Usted padece bronquitis aguda. Esto significa que tiene catarro bronquial. Las vas areas en los pulmones estn irritadas y le duelen (inflamadas). "Aguda" significa de comienzo sbito.  CAUSAS  La causa es el mismo virus que produce el resfro.  SNTOMAS   Dolores en el cuerpo   [employer].   Escalofros.   [employer].   Falta de aire.   Dolor de garganta  TRATAMIENTO  Generalmente la bronquitis aguda se trata con reposo y medicamentos para bajar la fiebre o Secretary/administrator tos. La [employer] de los sntomas deben desaparecer luego de [address] o de [place]. Tome mucho lquido para Restaurant manager, fast food las secreciones y Statistician. El mdico podr indicarle el uso de un inhalador para mejorar los sntomas. El inhalador mejora la falta de aire y [place] a Scientist, physiological tos. Puede tomar analgsicos de venta libre o medicamentos para calmar la tos, el Dentist y la [place]. Un vaporizador de aire fro podr ayudarlo a [employer] bronquiales y Statistician su eliminacin.  En general, no es necesario tomar antibiticos pero pueden prescribirse si fuma, tiene una enfermedad grave, sufre problemas pulmonares crnicos, es [place] persona de edad avanzada o tiene serios riesgos de sufrir complicaciones.Las Environmental consultant y el asma pueden empeorar la bronquitis. Los episodios repetidos de bronquitis pueden causar problemas pulmonares crnicos.  Evite fumar o aspirar el humo de otros fumadores.La exposicin al humo del cigarrillo o a irritantes qumicos har que la bronquitis empeore. Si fuma, considere el uso de goma de Theatre manager o parches para la piel que contengan nicotina para Paramedic los sntomas de abstinencia. Si deja de fumar, sus pulmones se curarn ms rpido.  La recuperacin de la bronquitis puede ser lenta, pero debera sentirse mejor despus de 2  3 das. La tos a veces puede durar entre 3 y 4 semanas.  Para  evitar otro brote de bronquitis aguda:   Deje de fumar.   Lvese las manos con frecuencia o use un desinfectante para manos, para eliminar virus.   Evite el contacto con personas que estn resfriadas o que tengan sntomas de haber contrado un virus.   Trate de no llevar las manos a la boca, la nariz o los ojos.  SOLICITE ATENCIN MDICA DE INMEDIATO SI:   Le sube la fiebre, tiene escalofros o Careers information officer.   Le falta el aire o escupe flema con sangre.   Se deshidrata, se desmaya, vomita repetidas veces o siente un dolor de cabeza muy intenso.   No mejora, o empeora, despus de 1 semana de tratamiento.  ASEGRESE DE QUE:   Comprende estas instrucciones.   Controlar su enfermedad.   Solicitar ayuda de inmediato si no mejora o si empeora.  Document Released: 11/06/2005 Document Revised: 07/19/2011 [facility] Patient Information 2012 [place], [place].

## 2012-01-04 NOTE — Progress Notes (Signed)
Patient ID: Jason [name], male   DOB: November 09, 1976, 36 y.o.   MRN: [id number]  35 year old man with uncontrolled diabetes, RA comes to the cliniccomplaining of cough, chest pain, shortness of breath, bloody sputum  , facial congestion and ear pain for past 2 weeks. She assessment and plan for more details. He is here with an interpreter who is helping me if with a history Compliant with his medications.  Physical exam.    General Appearance:     Filed Vitals:   01/04/12 1038  BP: 121/78  Pulse: 95  Temp: 97.5 F (36.4 C)  TempSrc: Oral  Height: 5\' 1"  (1.549 m)  Weight: 140 lb 8 oz (63.73 kg)  SpO2: 97%     Alert, cooperative, no distress, appears stated age  Head:    Normocephalic, without obvious abnormality, atraumatic  Eyes:    PERRL, conjunctiva/corneas clear, EOM's intact, fundi    benign, both eyes       Neck:   Supple, symmetrical, trachea midline, no adenopathy;       thyroid:  No enlargement/tenderness/nodules; no carotid   bruit or JVD  Lungs:     Clear to auscultation bilaterally, respirations unlabored  Chest wall:    No tenderness or deformity  Heart:    Regular rate and rhythm, S1 and S2 normal, no murmur, rub   or gallop  Abdomen:     Soft, non-tender, bowel sounds active all four quadrants,    no masses, no organomegaly  Extremities:   Extremities normal, atraumatic, no cyanosis or edema  Pulses:   2+ and symmetric all extremities  Skin:   Skin color, texture, turgor normal, no rashes or lesions  Neurologic:  nonfocal grossly   Review of system   Constitutional: Complaints of fever, chills, diaphoresis, appetite change and fatigue.  Respiratory: Complains of exertional SOB, cough, chest tightness, denies  wheezing.   Cardiovascular: Complains of chest pain, denies palpitations and leg swelling.  Gastrointestinal: Complains of abdominal pain , Denies nausea, vomiting, diarrhea, constipation, blood in stool and abdominal distention.  Skin: Denies pallor,  rash and wound.  Neurological: Denies dizziness, light-headedness, numbness and headaches.

## 2012-01-04 NOTE — Assessment & Plan Note (Signed)
Patient is on 10 mg prednisone daily. This was last prescribed in December by [facility]. The PCP will have to clarify patient's rheumatology followup and need for chronic steroids. Also regular followup for patients on steroid would be required. Will defer to PCP visit.

## 2012-01-04 NOTE — Progress Notes (Signed)
Due to language barrier, an interpreter was present during the history-taking and subsequent discussion (and for part of the physical exam) with this patient. Interpreter Wyvonnia Dusky 01/04/2012 for Dr Scot Dock at 10.15

## 2012-01-04 NOTE — Telephone Encounter (Signed)
Discussed with triage nurse who got patient a morning appointment per his request.

## 2012-04-02 ENCOUNTER — Telehealth: Payer: Self-pay | Admitting: Dietician

## 2012-04-02 NOTE — Telephone Encounter (Signed)
Unable to reach patient on phone number listed. Mailed letter requesting patient call.

## 2012-06-20 ENCOUNTER — Telehealth: Payer: Self-pay | Admitting: Dietician

## 2012-06-26 NOTE — Telephone Encounter (Signed)
Mailed letter in spanish requesting patient call for an appointment.

## 2012-07-03 IMAGING — CR DG CHEST 2V
2 series · 2 of 2 positions shown · non-contrast
Comparison: 11/15/2010 and earlier.

CLINICAL DATA: 35-year-old male with hemoptysis.  History of TB.
Cough.

CHEST - 2 VIEW

[w chest pa]
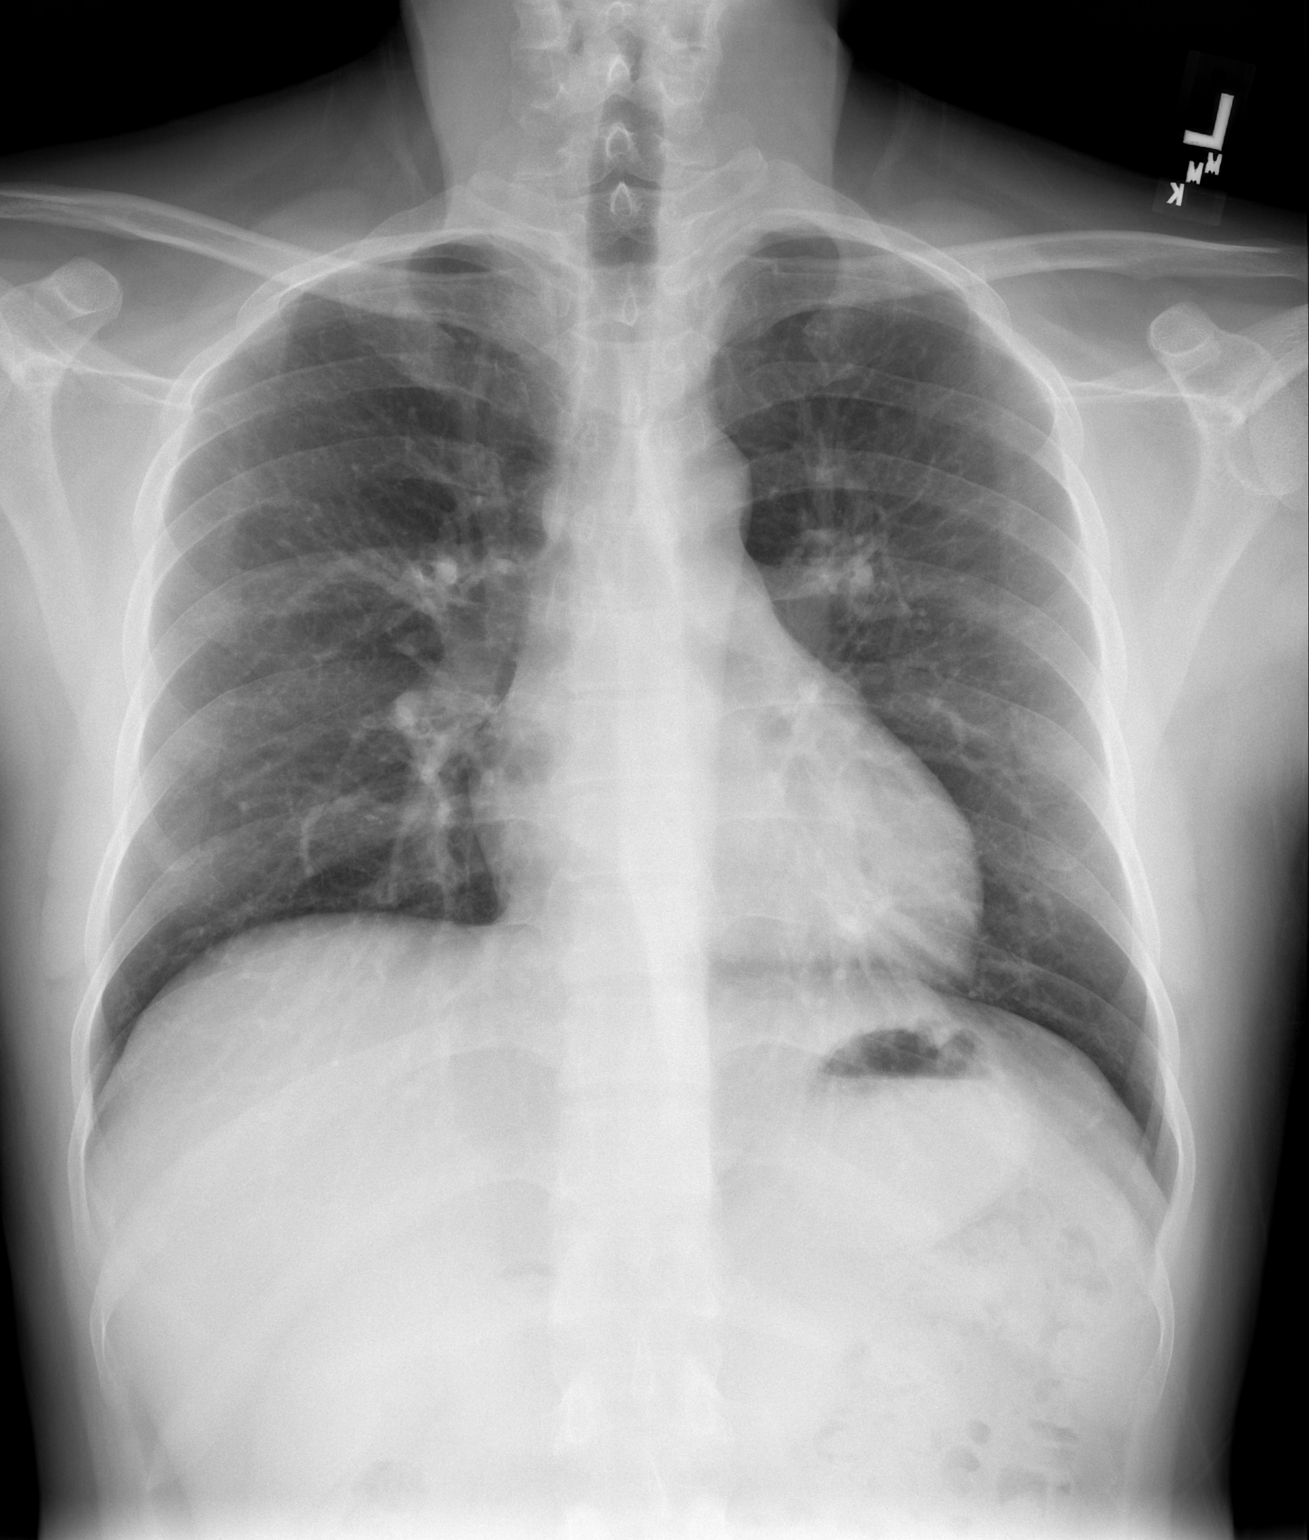

[w chest lat]
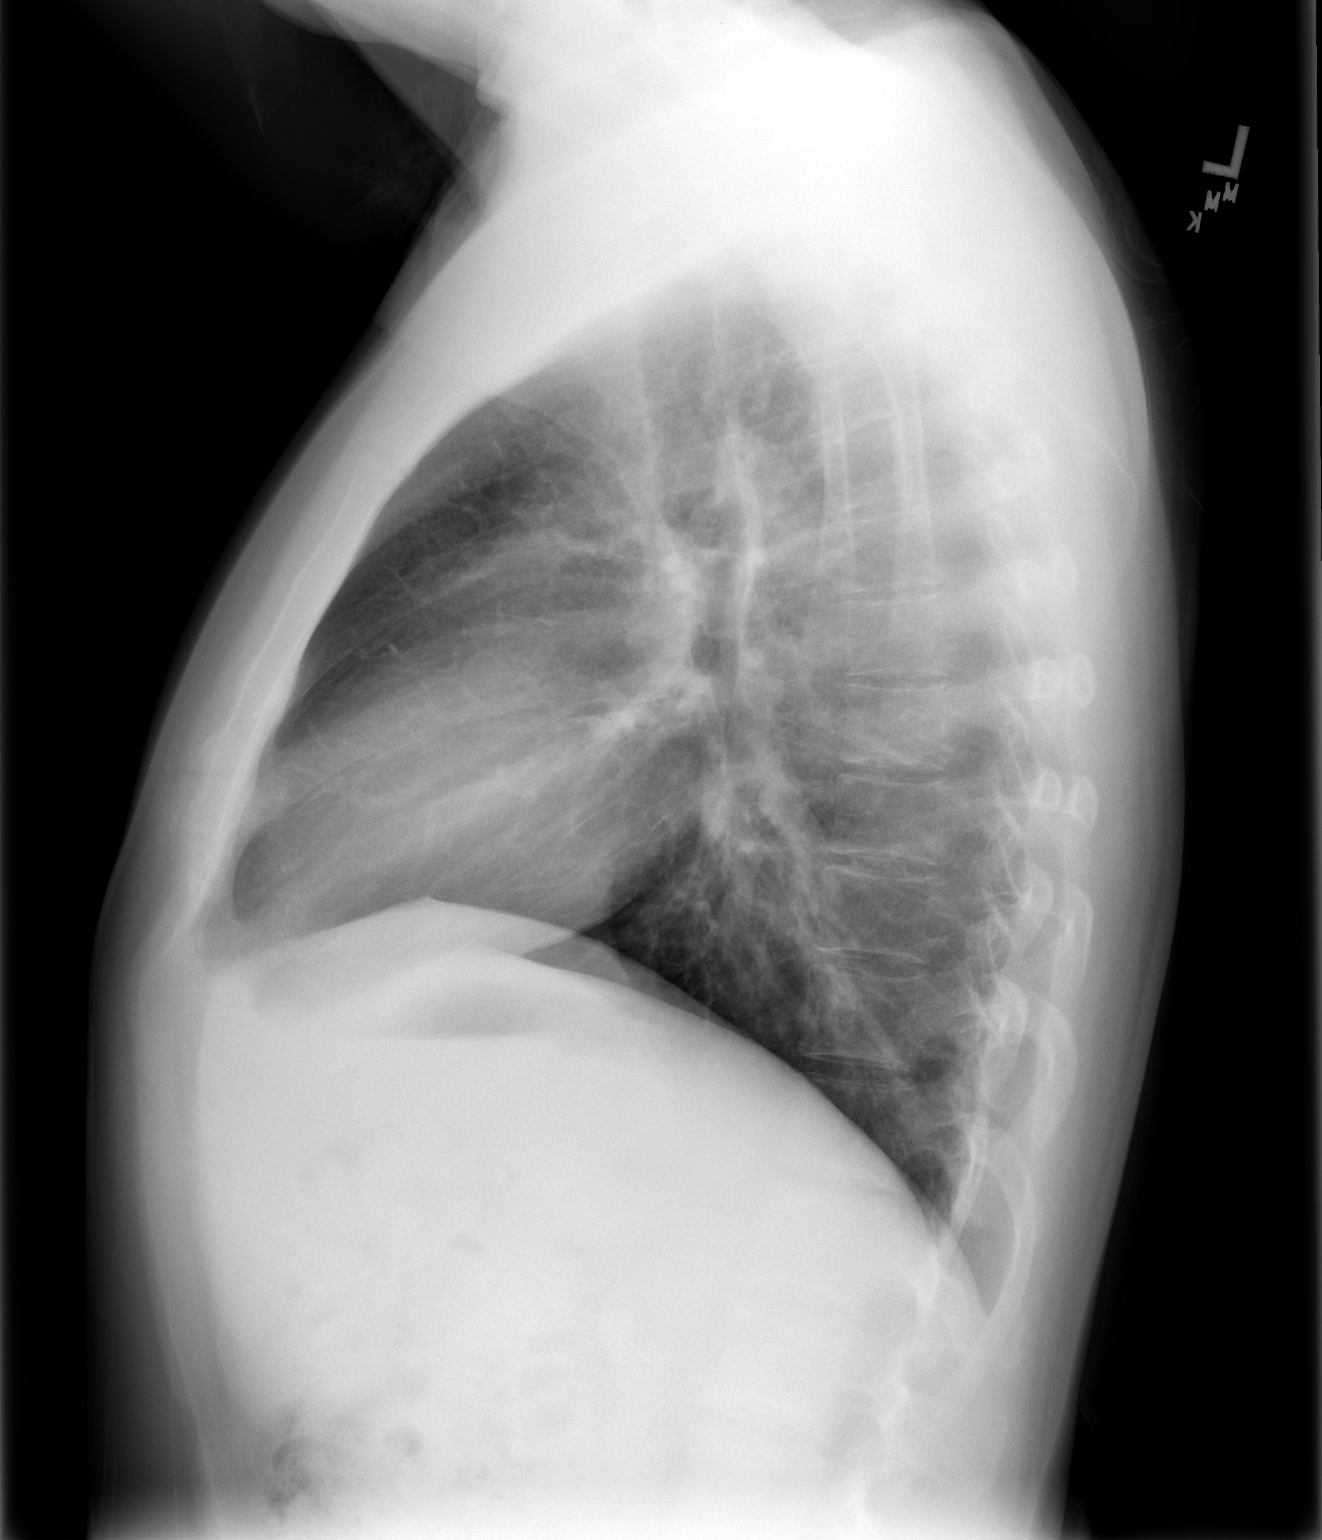

[2 of 2 positions shown; findings below may reference images not displayed]

FINDINGS: Stable lung volumes.  Cardiac size and mediastinal
contours are within normal limits.  Lung apices appear stable and
within normal limits; subtle nodular density is felt related to
first rib calcification.  No consolidation, new or confluent
pulmonary opacity.  No pneumothorax, edema or effusion. Stable
visualized osseous structures.
IMPRESSION: Stable radiographic appearance of the chest, no acute
cardiopulmonary abnormality.

## 2012-09-02 ENCOUNTER — Telehealth: Payer: Self-pay | Admitting: Dietician

## 2012-09-02 ENCOUNTER — Other Ambulatory Visit: Payer: Self-pay | Admitting: *Deleted

## 2012-09-02 DIAGNOSIS — R05 Cough: Secondary | ICD-10-CM

## 2012-09-02 DIAGNOSIS — F32A Depression, unspecified: Secondary | ICD-10-CM

## 2012-09-02 DIAGNOSIS — R059 Cough, unspecified: Secondary | ICD-10-CM

## 2012-09-02 DIAGNOSIS — F329 Major depressive disorder, single episode, unspecified: Secondary | ICD-10-CM

## 2012-09-02 DIAGNOSIS — E119 Type 2 diabetes mellitus without complications: Secondary | ICD-10-CM

## 2012-09-02 DIAGNOSIS — Z299 Encounter for prophylactic measures, unspecified: Secondary | ICD-10-CM

## 2012-09-02 NOTE — Telephone Encounter (Signed)
Forwarded to front office to schedule appointment and notified triage/refill  nurse about his request for appointment and refills.

## 2012-09-03 NOTE — Telephone Encounter (Signed)
Pt has an appt at [facility] tomorrow. This can be refilled at that time if deemed appropriate by provider. Thank you.

## 2012-09-04 ENCOUNTER — Telehealth: Payer: Self-pay | Admitting: *Deleted

## 2012-09-04 ENCOUNTER — Ambulatory Visit (INDEPENDENT_AMBULATORY_CARE_PROVIDER_SITE_OTHER): Payer: Self-pay | Admitting: Internal Medicine

## 2012-09-04 ENCOUNTER — Encounter: Payer: Self-pay | Admitting: Internal Medicine

## 2012-09-04 VITALS — BP 119/79 | HR 68 | Temp 98.8°F | Ht 61.0 in | Wt 143.1 lb

## 2012-09-04 DIAGNOSIS — Z7901 Long term (current) use of anticoagulants: Secondary | ICD-10-CM

## 2012-09-04 DIAGNOSIS — F329 Major depressive disorder, single episode, unspecified: Secondary | ICD-10-CM

## 2012-09-04 DIAGNOSIS — E119 Type 2 diabetes mellitus without complications: Secondary | ICD-10-CM

## 2012-09-04 DIAGNOSIS — R05 Cough: Secondary | ICD-10-CM

## 2012-09-04 DIAGNOSIS — Z299 Encounter for prophylactic measures, unspecified: Secondary | ICD-10-CM

## 2012-09-04 DIAGNOSIS — Z23 Encounter for immunization: Secondary | ICD-10-CM

## 2012-09-04 DIAGNOSIS — Z0000 Encounter for general adult medical examination without abnormal findings: Secondary | ICD-10-CM

## 2012-09-04 LAB — POCT GLYCOSYLATED HEMOGLOBIN (HGB A1C): Hemoglobin A1C: 11.5

## 2012-09-04 MED ORDER — INSULIN SYRINGES (DISPOSABLE) U-100 1 ML MISC: 1.0000 | Freq: Two times a day (BID) | Status: DC

## 2012-09-04 MED ORDER — ASPIRIN 81 MG PO TABS: 81.0000 mg | ORAL_TABLET | Freq: Every day | ORAL | Status: DC

## 2012-09-04 MED ORDER — CHLORPHENIRAMINE-PSEUDOEPH 4/60 MG PO MISC: 1.0000 | Freq: Four times a day (QID) | ORAL | Status: DC | PRN

## 2012-09-04 MED ORDER — PRAVASTATIN SODIUM 10 MG PO TABS: 10.0000 mg | ORAL_TABLET | Freq: Every day | ORAL | Status: DC

## 2012-09-04 MED ORDER — INSULIN NPH ISOPHANE & REGULAR (70-30) 100 UNIT/ML ~~LOC~~ SUSP: SUBCUTANEOUS | Status: DC

## 2012-09-04 MED ORDER — GLUCOSE BLOOD VI STRP: ORAL_STRIP | Status: DC

## 2012-09-04 MED ORDER — METFORMIN HCL ER 500 MG PO TB24: 1000.0000 mg | ORAL_TABLET | Freq: Two times a day (BID) | ORAL | Status: DC

## 2012-09-04 NOTE — Progress Notes (Signed)
  Subjective:    Patient ID: Jason [name], male    DOB: 15-Nov-1976, 36 y.o.   MRN: [id number]  HPI Presents for refills of medicines and flu shot. Reports that he has run out of Metformin and continues to have nonproductive cough.  Did not bring glucometer.   Review of Systems  Constitutional: Negative for fever and fatigue.  HENT: Positive for rhinorrhea and postnasal drip. Negative for sneezing, trouble swallowing and sinus pressure.   Eyes: Negative for visual disturbance.  Respiratory: Positive for cough. Negative for shortness of breath.   Cardiovascular: Negative for chest pain.  Gastrointestinal: Negative for abdominal pain and blood in stool.  Neurological: Negative for dizziness and numbness.  Hematological: Negative for adenopathy.       Objective:   Physical Exam  Constitutional: He appears well-developed and well-nourished. No distress.  HENT:  Head: Normocephalic and atraumatic.  Right Ear: External ear normal.  Left Ear: External ear normal.  Nose: Mucosal edema and rhinorrhea present. Right sinus exhibits no maxillary sinus tenderness and no frontal sinus tenderness. Left sinus exhibits no maxillary sinus tenderness and no frontal sinus tenderness.  Mouth/Throat: Oropharynx is clear and moist and mucous membranes are normal. No oropharyngeal exudate or posterior oropharyngeal erythema.       Boggy turbinates bilaterally with clear rhinorrhea, long uvula that rests on back of tongue (likely normal variant), webbing on posterior pharynx from post nasal drip, no cobblestoning appreciated  Eyes: Conjunctivae normal and EOM are normal. Pupils are equal, round, and reactive to light. Right eye exhibits no discharge. Left eye exhibits no discharge. No scleral icterus.  Neck: Normal range of motion. Neck supple. No thyromegaly present.          Assessment & Plan:  1. DM2: poorly controlled, pt w/o Metformin recently and there is question if he is taking the insulin  on regular basis as prescribed. -cont Novolin 35 am 25 pm, pt agrees to return to clinic in 1 month with glucometer and/or log of cbgs -check urine alb at next visit  2. Upper Airway Cough Syndrome: gave trial of chlorpheniramine (1st gen antihistamine), pt to report improvement or lack there of at f/u -consider PPI  Therapy since component of GERD is possible   3. Preventative Care: flu shot given today

## 2012-09-04 NOTE — Patient Instructions (Addendum)
You received the flu shot today. I have refilled your medications and gave your a new medication for your runny nose and cough. Your blood sugars are running high. Fill the Lantus and Metformin and take as prescribed.  Record your blood sugars and follow-up with your doctor in 1 month and bring you glucose meter.

## 2012-09-04 NOTE — Telephone Encounter (Signed)
Pharmacist at [facility] [address] called earlier; requesting direction clarification on Metformin. Talked to Dr Bosie Clos - Metformin 500mg  2 tabs twice a day. Verbal order given.

## 2012-09-09 DIAGNOSIS — Z0000 Encounter for general adult medical examination without abnormal findings: Secondary | ICD-10-CM | POA: Insufficient documentation

## 2012-09-09 NOTE — Assessment & Plan Note (Signed)
Received flu shot today. 

## 2012-10-04 ENCOUNTER — Encounter: Payer: Self-pay | Admitting: Internal Medicine

## 2012-11-28 ENCOUNTER — Encounter: Payer: Self-pay | Admitting: Internal Medicine

## 2012-12-11 ENCOUNTER — Telehealth: Payer: Self-pay | Admitting: Internal Medicine

## 2012-12-11 NOTE — Telephone Encounter (Signed)
Patient was mailed letter on 11-28-12 asking to call the clinic to schedule his HM visit.  Letter was returned due to incorrect mailing address.

## 2013-03-17 ENCOUNTER — Telehealth: Payer: Self-pay | Admitting: Dietician

## 2013-03-17 NOTE — Telephone Encounter (Signed)
Patient updated about time of his upcoming appointment.

## 2013-03-21 ENCOUNTER — Telehealth: Payer: Self-pay | Admitting: Internal Medicine

## 2013-03-21 ENCOUNTER — Encounter: Payer: Self-pay | Admitting: Internal Medicine

## 2013-03-21 ENCOUNTER — Ambulatory Visit (INDEPENDENT_AMBULATORY_CARE_PROVIDER_SITE_OTHER): Payer: Self-pay | Admitting: Internal Medicine

## 2013-03-21 VITALS — BP 132/88 | HR 91 | Temp 98.1°F | Ht 63.4 in | Wt 142.9 lb

## 2013-03-21 DIAGNOSIS — E119 Type 2 diabetes mellitus without complications: Secondary | ICD-10-CM

## 2013-03-21 DIAGNOSIS — I10 Essential (primary) hypertension: Secondary | ICD-10-CM

## 2013-03-21 DIAGNOSIS — F329 Major depressive disorder, single episode, unspecified: Secondary | ICD-10-CM

## 2013-03-21 DIAGNOSIS — E785 Hyperlipidemia, unspecified: Secondary | ICD-10-CM

## 2013-03-21 DIAGNOSIS — Z299 Encounter for prophylactic measures, unspecified: Secondary | ICD-10-CM

## 2013-03-21 DIAGNOSIS — H6690 Otitis media, unspecified, unspecified ear: Secondary | ICD-10-CM | POA: Insufficient documentation

## 2013-03-21 LAB — HM DIABETES EYE EXAM

## 2013-03-21 LAB — CBC
HCT: 45.9 % (ref 39.0–52.0)
Platelets: 264 10*3/uL (ref 150–400)
RBC: 5.18 MIL/uL (ref 4.22–5.81)
RDW: 13.2 % (ref 11.5–15.5)

## 2013-03-21 LAB — BASIC METABOLIC PANEL WITH GFR
BUN: 14 mg/dL (ref 6–23)
Chloride: 96 mEq/L (ref 96–112)
GFR, Est Non African American: >89 mL/min
Potassium: 4.2 mEq/L (ref 3.5–5.3)

## 2013-03-21 LAB — GLUCOSE, CAPILLARY

## 2013-03-21 LAB — POCT GLYCOSYLATED HEMOGLOBIN (HGB A1C): Hemoglobin A1C: 10.1

## 2013-03-21 MED ORDER — METFORMIN HCL ER 500 MG PO TB24: 1000.0000 mg | ORAL_TABLET | Freq: Two times a day (BID) | ORAL | Status: DC

## 2013-03-21 MED ORDER — PRAVASTATIN SODIUM 10 MG PO TABS: 10.0000 mg | ORAL_TABLET | Freq: Every day | ORAL | Status: DC

## 2013-03-21 MED ORDER — ASPIRIN 81 MG PO TABS: 81.0000 mg | ORAL_TABLET | Freq: Every day | ORAL | Status: DC

## 2013-03-21 MED ORDER — CIPROFLOXACIN HCL 500 MG PO TABS: 500.0000 mg | ORAL_TABLET | Freq: Two times a day (BID) | ORAL | Status: DC

## 2013-03-21 MED ORDER — INSULIN ASPART PROT & ASPART (70-30 MIX) 100 UNIT/ML ~~LOC~~ SUSP
20.0000 [IU] | Freq: Once | SUBCUTANEOUS | Status: AC
  Administered 2013-03-21: 20 [IU] via SUBCUTANEOUS

## 2013-03-21 NOTE — Telephone Encounter (Signed)
I received a call from [facility] lab at 19:33 PM. I was told that patient's blood sugar level is critically high. His blood sugar level is 514 by BMP. Patient was seen on 03/21/13 in clinic, because of poorly controled DM-II (A1c=10.1), his insuline dose was adjusted by Dr. Allena Katz. His dose of NovoLog 70/30 was increased to 40 units with breakfast and 30 units with dinner on 03/21/13. I tried to call patient, but there is no phone number listed in Epic. I called his pharmacy to try to get his number without success. Due to confidential issue, pharmacy did not release his phone number to me. Then we called [facility] and asked police officer to have gone patient's home to check on him. Police officer went to his home ([address]; [place] [address]), but no one is at home.   Recent Labs Lab 03/21/13 1108  NA 131*  K 4.2  CL 96  CO2 23  GLUCOSE 514*  BUN 14  CREATININE 0.82  CALCIUM 9.9   A&P: Since his BMP was drawn at 11:08 AM on 03/21/13 and his insulin dose was increased, I assume that his CBG may be better now if he follows Dr. Eliane Decree instruction to have taken his insulin.  I think that patient is not at immediate danger at this moment. I will send a message to front desk to make an appointment for patient on 03/24/13 for further evaluation.  Lorretta Harp, MD PGY2, Internal Medicine Teaching Service Pager: [phone]

## 2013-03-21 NOTE — Progress Notes (Signed)
Interpreter Wyvonnia Dusky for Dr Allena Katz

## 2013-03-21 NOTE — Assessment & Plan Note (Signed)
BP Readings from Last 3 Encounters:  03/21/13 132/88  09/04/12 119/79  01/04/12 121/78    Lab Results  Component Value Date   NA 134* 01/04/2012   K 4.2 01/04/2012   CREATININE 0.58 01/04/2012    Assessment: Blood pressure control:  At goal Progress toward BP goal:   stable Comments: not on any medications.  Plan: Medications:  Patient not on any blood pressure medication as at this point in time. Educational resources provided: brochure Self management tools provided:   Other plans:

## 2013-03-21 NOTE — Patient Instructions (Signed)
Please make followup appointment in a week.  Start taking ciprofloxacin 500 mg twice daily for 7 days.  Increased dose of NovoLog 70/30 to 40 units with breakfast and 30 units with dinner.  I have sent a refill for metformin, Pravachol and aspirin to the pharmacy. You have refills for insulin until October this year.

## 2013-03-21 NOTE — Assessment & Plan Note (Signed)
Acute otitis media.  - Patient diabetic which is uncontrolled. - treat with ciprofloxacin 500 mg twice daily for 7 days. - Followup clinic visit in one week. - Tylenol for pain.

## 2013-03-21 NOTE — Assessment & Plan Note (Signed)
Lab Results  Component Value Date   HGBA1C 10.1 03/21/2013   HGBA1C 11.5 09/04/2012   HGBA1C 11.5 01/04/2012     Assessment: Diabetes control:   Poor Progress toward A1C goal:    Not good. Comments: inconsistent with taking insulin.  Plan: Medications:  increase insulin to 40 units beofer breakfast and 30 units before dinner. Refilled metformin. Home glucose monitoring: Frequency:   Timing:   Instruction/counseling given: reminded to bring blood glucose meter & log to each visit, reminded to bring medications to each visit, discussed foot care and discussed diet Educational resources provided: brochure Self management tools provided:   Other plans: will get retinal scan today. Urine micro/creat ratio checked today.

## 2013-03-21 NOTE — Assessment & Plan Note (Signed)
Pravachol refilled. - we'll check lipid panel during next visit.

## 2013-03-21 NOTE — Progress Notes (Signed)
  Subjective:    Patient ID: Jason [name], male    DOB: 1976-01-21, 37 y.o.   MRN: [id number]  HPI patient is a pleasant 37 year old man with uncontrolled DM 2, hyperlipidemia, hypertension and other problems as per problem list who comes to the clinic for right ear pain for 3 weeks. He complains of right ear pain which started about 3 weeks ago, is associated with headache, tinnitus, fullness in ear, fever. Denies any discharge coming out of the ear. Denies any symptoms of sinusitis or pharyngitis prior to having ear pain.  He denies any previous episodes similar to this. Does not have any nausea vomiting, neck pain, neck stiffness, chest pain, short of breath, focal weakness or numbness anywhere in body, abdominal pain, diarrhea.  He is not taking his insulin regularly and is out of metformin.     Review of Systems    as per history of present illness. Objective:   Physical Exam  General: NAD HEENT: PERRL, EOMI, no scleral icterus.  Otoscopic exam: Right tympanic membrane fullness or bulging. No perforation. No hyperemia or discharge. Normal right-sided exam. Cardiac: RRR, no rubs, murmurs or gallops Pulm: clear to auscultation bilaterally, moving normal volumes of air Abd: soft, nontender, nondistended, BS present Ext: warm and well perfused, no pedal edema Neuro: alert and oriented X3, cranial nerves II-XII grossly intact       Assessment & Plan:

## 2013-03-22 LAB — MICROALBUMIN / CREATININE URINE RATIO
Microalb Creat Ratio: 237.7 mg/g — ABNORMAL HIGH (ref 0.0–30.0)
Microalb, Ur: 4.35 mg/dL — ABNORMAL HIGH (ref 0.00–1.89)

## 2013-03-27 NOTE — Progress Notes (Signed)
Case discussed with Dr. Patel immediately after the resident saw the patient.  We reviewed the resident's history and exam and pertinent patient test results.  I agree with the assessment, diagnosis and plan of care documented in the resident's note. 

## 2013-03-28 ENCOUNTER — Ambulatory Visit: Payer: Self-pay

## 2013-03-28 ENCOUNTER — Ambulatory Visit (INDEPENDENT_AMBULATORY_CARE_PROVIDER_SITE_OTHER): Payer: Self-pay | Admitting: Internal Medicine

## 2013-03-28 ENCOUNTER — Encounter: Payer: Self-pay | Admitting: Internal Medicine

## 2013-03-28 DIAGNOSIS — E119 Type 2 diabetes mellitus without complications: Secondary | ICD-10-CM

## 2013-03-28 DIAGNOSIS — E785 Hyperlipidemia, unspecified: Secondary | ICD-10-CM

## 2013-03-28 DIAGNOSIS — H6690 Otitis media, unspecified, unspecified ear: Secondary | ICD-10-CM

## 2013-03-28 LAB — LIPID PANEL
Cholesterol: 204 mg/dL — ABNORMAL HIGH (ref 0–200)
HDL: 53 mg/dL (ref >39)
Total CHOL/HDL Ratio: 3.8 Ratio

## 2013-03-28 MED ORDER — AMOXICILLIN-POT CLAVULANATE 875-125 MG PO TABS: 1.0000 | ORAL_TABLET | Freq: Two times a day (BID) | ORAL | Status: DC

## 2013-03-28 NOTE — Assessment & Plan Note (Signed)
He takes NovoLog 70/30 40 units in morning and 30 units in evening along with metformin.

## 2013-03-28 NOTE — Assessment & Plan Note (Addendum)
Unresolving ear infection. Mild worsening of symptoms as described in history of present illness. - Completed ciprofloxacin 500 mg twice daily for 7 days without much improvement.  - ENT OV on tuesday for further evaluation considering his risk of complications with uncontrolled diabetes. - Start on Augmentin 875/125 one tablet twice daily until he sees ENT. - Return office visit as needed.

## 2013-03-28 NOTE — Progress Notes (Signed)
  Subjective:    Patient ID: Jason [name], male    DOB: February 15, 1976, 37 y.o.   MRN: [id number]  HPI patient is a pleasant 37 year old man with uncontrolled DM 2, hyper lipidemia, hypertension and other problems as her problem list who comes to the clinic for followup visit for his ear infection.  I saw him a week before for right ear pain and was found to have right middle ear infection. We started him on ciprofloxacin 500 twice a day for one week, which he completed the course without any significant improvement in symptoms.   He still describes similar right ear pain, with fever, chills, headache, worsening pain surrounding the right ear- which is making him how to eat as it hurts when he swallows, ringing in the ear.   History does not have any neck stiffness, nausea, vomiting, vision changes.  Review of Systems    As per history of present illness.  Objective:   Physical Exam  General: NAD HEENT: PERRL, EOMI, no scleral icterus. Right ear cloudy tympanic membrane. Swelling and tenderness surrounding right ear- tenderness of the right mastoid. Some cloudiness in left ear. Cardiac: S1, S2, RRR, no rubs, murmurs or gallops Pulm: clear to auscultation bilaterally, moving normal volumes of air Abd: soft, nontender, nondistended, BS present Ext: warm and well perfused, no pedal edema Neuro: alert and oriented X3, cranial nerves II-XII grossly intact       Assessment & Plan:

## 2013-03-28 NOTE — Patient Instructions (Signed)
Please make a followup appointment in 4-6 weeks.  Followup with ENT for your ear pain.  Start taking Augmentin- 875, twice daily for 10 days.  Continue taking insulin and other medications as you do.

## 2013-03-28 NOTE — Progress Notes (Addendum)
I personally examined this case. I agree with the findings mentioned by Dr. Allena Katz. We discussed this case together and the documentation is in line with the plan of care that we decided on.

## 2013-04-15 ENCOUNTER — Ambulatory Visit: Payer: Self-pay | Admitting: Internal Medicine

## 2013-04-17 NOTE — Addendum Note (Signed)
Addended by: Daylon Lafavor N on: 04/17/2013 12:25 PM   Modules accepted: Orders  

## 2013-04-17 NOTE — Addendum Note (Signed)
Addended by: Bufford Spikes on: 04/17/2013 12:25 PM   Modules accepted: Orders

## 2013-05-16 ENCOUNTER — Encounter: Payer: Self-pay | Admitting: Internal Medicine

## 2013-10-09 ENCOUNTER — Telehealth: Payer: Self-pay | Admitting: *Deleted

## 2013-10-09 MED ORDER — INSULIN NPH ISOPHANE & REGULAR (70-30) 100 UNIT/ML ~~LOC~~ SUSP: SUBCUTANEOUS | Status: DC

## 2013-10-09 NOTE — Telephone Encounter (Signed)
Fax from [facility] - requesting refill on Relion Novolin 70/30 Inject 35 units in the am and 25 units in the evening Qty 10. Last refilled 04/25/13. Thanks

## 2013-11-04 ENCOUNTER — Inpatient Hospital Stay (HOSPITAL_COMMUNITY)
Admission: AD | Admit: 2013-11-04 | Discharge: 2013-11-08 | DRG: 638 | Disposition: A | Discharge status: Home / Self Care | Payer: Self-pay | Source: Ambulatory Visit | Attending: Internal Medicine | Admitting: Internal Medicine

## 2013-11-04 ENCOUNTER — Encounter (HOSPITAL_COMMUNITY): Payer: Self-pay | Admitting: General Practice

## 2013-11-04 ENCOUNTER — Encounter: Payer: Self-pay | Admitting: Internal Medicine

## 2013-11-04 ENCOUNTER — Ambulatory Visit (INDEPENDENT_AMBULATORY_CARE_PROVIDER_SITE_OTHER): Payer: Self-pay | Admitting: Internal Medicine

## 2013-11-04 VITALS — BP 132/94 | HR 138 | Temp 97.5°F | Ht 63.0 in | Wt 125.2 lb

## 2013-11-04 DIAGNOSIS — R109 Unspecified abdominal pain: Secondary | ICD-10-CM

## 2013-11-04 DIAGNOSIS — E873 Alkalosis: Secondary | ICD-10-CM | POA: Diagnosis present

## 2013-11-04 DIAGNOSIS — A088 Other specified intestinal infections: Secondary | ICD-10-CM | POA: Diagnosis present

## 2013-11-04 DIAGNOSIS — R112 Nausea with vomiting, unspecified: Secondary | ICD-10-CM

## 2013-11-04 DIAGNOSIS — R079 Chest pain, unspecified: Secondary | ICD-10-CM

## 2013-11-04 DIAGNOSIS — E1310 Other specified diabetes mellitus with ketoacidosis without coma: Principal | ICD-10-CM | POA: Diagnosis present

## 2013-11-04 DIAGNOSIS — E119 Type 2 diabetes mellitus without complications: Secondary | ICD-10-CM

## 2013-11-04 DIAGNOSIS — E1165 Type 2 diabetes mellitus with hyperglycemia: Secondary | ICD-10-CM | POA: Diagnosis present

## 2013-11-04 DIAGNOSIS — M069 Rheumatoid arthritis, unspecified: Secondary | ICD-10-CM | POA: Diagnosis present

## 2013-11-04 DIAGNOSIS — E113399 Type 2 diabetes mellitus with moderate nonproliferative diabetic retinopathy without macular edema, unspecified eye: Secondary | ICD-10-CM | POA: Diagnosis present

## 2013-11-04 DIAGNOSIS — E785 Hyperlipidemia, unspecified: Secondary | ICD-10-CM | POA: Diagnosis present

## 2013-11-04 DIAGNOSIS — E860 Dehydration: Secondary | ICD-10-CM | POA: Diagnosis present

## 2013-11-04 DIAGNOSIS — K469 Unspecified abdominal hernia without obstruction or gangrene: Secondary | ICD-10-CM

## 2013-11-04 DIAGNOSIS — I10 Essential (primary) hypertension: Secondary | ICD-10-CM | POA: Diagnosis present

## 2013-11-04 DIAGNOSIS — K219 Gastro-esophageal reflux disease without esophagitis: Secondary | ICD-10-CM

## 2013-11-04 DIAGNOSIS — E861 Hypovolemia: Secondary | ICD-10-CM | POA: Diagnosis present

## 2013-11-04 DIAGNOSIS — R1084 Generalized abdominal pain: Secondary | ICD-10-CM

## 2013-11-04 DIAGNOSIS — R000 Tachycardia, unspecified: Secondary | ICD-10-CM | POA: Diagnosis present

## 2013-11-04 DIAGNOSIS — E1010 Type 1 diabetes mellitus with ketoacidosis without coma: Secondary | ICD-10-CM

## 2013-11-04 DIAGNOSIS — E1110 Type 2 diabetes mellitus with ketoacidosis without coma: Secondary | ICD-10-CM | POA: Diagnosis present

## 2013-11-04 LAB — CBC WITH DIFFERENTIAL/PLATELET
Basophils Absolute: 0 10*3/uL (ref 0.0–0.1)
Basophils Relative: 0 % (ref 0–1)
Eosinophils Absolute: 0 10*3/uL (ref 0.0–0.7)
Hemoglobin: 18.2 g/dL — ABNORMAL HIGH (ref 13.0–17.0)
MCH: 31.1 pg (ref 26.0–34.0)
MCHC: 35.3 g/dL (ref 30.0–36.0)
Monocytes Relative: 10 % (ref 3–12)
Neutro Abs: 7 10*3/uL (ref 1.7–7.7)
Neutrophils Relative %: 80 % — ABNORMAL HIGH (ref 43–77)
Platelets: 343 10*3/uL (ref 150–400)
RDW: 12.2 % (ref 11.5–15.5)

## 2013-11-04 LAB — GLUCOSE, CAPILLARY: Glucose-Capillary: >600 mg/dL (ref 70–99)

## 2013-11-04 LAB — BASIC METABOLIC PANEL
BUN: 27 mg/dL — ABNORMAL HIGH (ref 6–23)
CO2: 13 mEq/L — ABNORMAL LOW (ref 19–32)
Calcium: 8.4 mg/dL (ref 8.4–10.5)
Calcium: 8.8 mg/dL (ref 8.4–10.5)
Chloride: 102 mEq/L (ref 96–112)
Creatinine, Ser: 0.85 mg/dL (ref 0.50–1.35)
GFR calc Af Amer: >90 mL/min (ref >90)
GFR calc non Af Amer: >90 mL/min (ref >90)
GFR calc non Af Amer: >90 mL/min (ref >90)
Glucose, Bld: 427 mg/dL — ABNORMAL HIGH (ref 70–99)
Glucose, Bld: 305 mg/dL — ABNORMAL HIGH (ref 70–99)
Potassium: 4.2 mEq/L (ref 3.5–5.1)
Potassium: 3.6 mEq/L (ref 3.5–5.1)
Sodium: 139 mEq/L (ref 135–145)
Sodium: 140 mEq/L (ref 135–145)

## 2013-11-04 LAB — CBC
HCT: 47.3 % (ref 39.0–52.0)
Hemoglobin: 16.8 g/dL (ref 13.0–17.0)
MCH: 31.5 pg (ref 26.0–34.0)
MCHC: 35.5 g/dL (ref 30.0–36.0)
RBC: 5.33 MIL/uL (ref 4.22–5.81)
WBC: 8.1 10*3/uL (ref 4.0–10.5)

## 2013-11-04 LAB — HEMOGLOBIN A1C
Hgb A1c MFr Bld: 11.6 % — ABNORMAL HIGH (ref <5.7)
Mean Plasma Glucose: 286 mg/dL — ABNORMAL HIGH (ref <117)

## 2013-11-04 LAB — COMPLETE METABOLIC PANEL WITH GFR
CO2: 14 mEq/L — ABNORMAL LOW (ref 19–32)
Calcium: 9.3 mg/dL (ref 8.4–10.5)
Chloride: 88 mEq/L — ABNORMAL LOW (ref 96–112)
GFR, Est African American: >89 mL/min
GFR, Est Non African American: 87 mL/min
Glucose, Bld: 637 mg/dL (ref 70–99)
Sodium: 136 mEq/L (ref 135–145)
Total Bilirubin: 0.3 mg/dL (ref 0.3–1.2)
Total Protein: 9.1 g/dL — ABNORMAL HIGH (ref 6.0–8.3)

## 2013-11-04 LAB — MRSA PCR SCREENING: MRSA by PCR: NEGATIVE

## 2013-11-04 LAB — LIPASE: Lipase: 13 U/L (ref 11–59)

## 2013-11-04 MED ORDER — SODIUM CHLORIDE 0.9 % IV SOLN
INTRAVENOUS | Status: DC
  Administered 2013-11-04: 4.5 [IU]/h via INTRAVENOUS
  Administered 2013-11-05: 5.9 [IU]/h via INTRAVENOUS
  Administered 2013-11-05: 2.3 [IU]/h via INTRAVENOUS
  Administered 2013-11-05: 5.2 [IU]/h via INTRAVENOUS
  Administered 2013-11-05: 1.4 [IU]/h via INTRAVENOUS
  Administered 2013-11-05: 3.7 [IU]/h via INTRAVENOUS
  Filled 2013-11-04 (×2): qty 1

## 2013-11-04 MED ORDER — ENOXAPARIN SODIUM 40 MG/0.4ML ~~LOC~~ SOLN: 40.0000 mg | SUBCUTANEOUS | Status: DC

## 2013-11-04 MED ORDER — ASPIRIN 81 MG PO CHEW
81.0000 mg | CHEWABLE_TABLET | Freq: Every day | ORAL | Status: DC
  Administered 2013-11-04 – 2013-11-08 (×5): 81 mg via ORAL
  Filled 2013-11-04 (×5): qty 1

## 2013-11-04 MED ORDER — ENOXAPARIN SODIUM 40 MG/0.4ML ~~LOC~~ SOLN
40.0000 mg | SUBCUTANEOUS | Status: DC
  Administered 2013-11-04 – 2013-11-07 (×4): 40 mg via SUBCUTANEOUS
  Filled 2013-11-04 (×5): qty 0.4

## 2013-11-04 MED ORDER — ASPIRIN 81 MG PO TABS: 81.0000 mg | ORAL_TABLET | Freq: Every day | ORAL | Status: DC

## 2013-11-04 MED ORDER — POTASSIUM CHLORIDE CRYS ER 20 MEQ PO TBCR
40.0000 meq | EXTENDED_RELEASE_TABLET | ORAL | Status: AC
  Administered 2013-11-04 – 2013-11-05 (×2): 40 meq via ORAL
  Filled 2013-11-04 (×2): qty 2

## 2013-11-04 MED ORDER — SIMVASTATIN 5 MG PO TABS
5.0000 mg | ORAL_TABLET | Freq: Every day | ORAL | Status: DC
  Administered 2013-11-04 – 2013-11-07 (×4): 5 mg via ORAL
  Filled 2013-11-04 (×5): qty 1

## 2013-11-04 MED ORDER — FLUOXETINE HCL 20 MG PO CAPS
20.0000 mg | ORAL_CAPSULE | Freq: Every day | ORAL | Status: DC
  Administered 2013-11-04 – 2013-11-08 (×5): 20 mg via ORAL
  Filled 2013-11-04 (×5): qty 1

## 2013-11-04 MED ORDER — DEXTROSE-NACL 5-0.45 % IV SOLN
INTRAVENOUS | Status: DC
  Administered 2013-11-05: 01:00:00 via INTRAVENOUS
  Administered 2013-11-05 (×2): 1000 mL via INTRAVENOUS

## 2013-11-04 MED ORDER — DEXTROSE 50 % IV SOLN: 25.0000 mL | INTRAVENOUS | Status: DC | PRN

## 2013-11-04 MED ORDER — ONDANSETRON HCL 4 MG PO TABS
4.0000 mg | ORAL_TABLET | Freq: Three times a day (TID) | ORAL | Status: DC | PRN
  Administered 2013-11-04: 4 mg via ORAL
  Filled 2013-11-04: qty 1

## 2013-11-04 MED ORDER — INFLUENZA VAC SPLIT QUAD 0.5 ML IM SUSP
0.5000 mL | INTRAMUSCULAR | Status: AC
  Administered 2013-11-05: 0.5 mL via INTRAMUSCULAR
  Filled 2013-11-04: qty 0.5

## 2013-11-04 MED ORDER — SODIUM CHLORIDE 0.9 % IV SOLN
INTRAVENOUS | Status: AC
  Administered 2013-11-04: 1000 mL via INTRAVENOUS

## 2013-11-04 MED ORDER — SODIUM CHLORIDE 0.9 % IV BOLUS (SEPSIS)
1000.0000 mL | Freq: Once | INTRAVENOUS | Status: AC
  Administered 2013-11-04: 1000 mL via INTRAVENOUS

## 2013-11-04 MED ORDER — POTASSIUM CHLORIDE 10 MEQ/100ML IV SOLN: 10.0000 meq | INTRAVENOUS | Status: AC

## 2013-11-04 MED ORDER — POTASSIUM CHLORIDE 10 MEQ/100ML IV SOLN
10.0000 meq | INTRAVENOUS | Status: AC
  Administered 2013-11-04 (×2): 10 meq via INTRAVENOUS
  Filled 2013-11-04 (×2): qty 100

## 2013-11-04 MED ORDER — SODIUM CHLORIDE 0.9 % IV SOLN
INTRAVENOUS | Status: DC
  Administered 2013-11-04: 19:00:00 via INTRAVENOUS

## 2013-11-04 NOTE — Progress Notes (Signed)
   Subjective:    Patient ID: Jason [name], male    DOB: 30-Oct-1976, 37 y.o.   MRN: [id number]  HPI Comments: Mr. Liby is a 37 year old male with a PMH of DM type 2, HTN, GERD and RA.  He presents with a 3 day history of N/V.  He reports vomiting about every 30 minutes.  He has been unable to keep anything down and has not taken his insulin in 3 days because of this.  Associated symptoms include headache, chills, abdominal pain and diarrhea.  The abdominal pain is mostly in the upper quadrants.  He had two episodes of non-bloody diarrhea yesterday.  He denies CP, URI symptoms, neck pain, new food/environmental exposures, sick contacts or recent travel.    Diabetes Hypoglycemia symptoms include headaches. Pertinent negatives for diabetes include no chest pain.  Emesis  Associated symptoms include abdominal pain, chills, diarrhea and headaches. Pertinent negatives include no chest pain or coughing.  Diarrhea  Associated symptoms include abdominal pain, chills, headaches and vomiting. Pertinent negatives include no coughing.      Review of Systems  Constitutional: Positive for chills, activity change and appetite change.  Respiratory: Negative for cough and shortness of breath.   Cardiovascular: Negative for chest pain.  Gastrointestinal: Positive for nausea, vomiting, abdominal pain and diarrhea. Negative for constipation and blood in stool.  Genitourinary: Negative for dysuria.  Musculoskeletal: Negative for back pain, neck pain and neck stiffness.  Neurological: Positive for headaches.       Objective:   Physical Exam  Vitals reviewed. Constitutional: He is oriented to person, place, and time. He appears well-developed and well-nourished.  Vomited (water) twice during exam.  HENT:  Head: Normocephalic and atraumatic.  Mouth/Throat: No oropharyngeal exudate.  Oropharynx dry  Eyes: Pupils are equal, round, and reactive to light.  Neck: Normal range of motion. Neck supple.   Cardiovascular: Regular rhythm and normal heart sounds.   tachycardic  Pulmonary/Chest: Effort normal and breath sounds normal. No respiratory distress. He has no wheezes. He has no rales.  Abdominal: Soft. Bowel sounds are normal. He exhibits no distension. There is no rebound and no guarding.  + upper abdominal/epigastric TTP  Musculoskeletal: Normal range of motion. He exhibits no edema and no tenderness.  Lymphadenopathy:    He has no cervical adenopathy.  Neurological: He is alert and oriented to person, place, and time.  Skin: Skin is warm. He is not diaphoretic.  Psychiatric: He has a normal mood and affect. His behavior is normal.          Assessment & Plan:  3 day history of N/V in a diabetic with CBG in clinic > 600.  Patient actively vomiting and tachycardic.  Concern for DKA.  Will obtain labs and admit the patient.

## 2013-11-04 NOTE — Assessment & Plan Note (Addendum)
3 day history of N/V in a diabetic with CBG in clinic > 600.  Patient actively vomiting and tachycardic.  Concern for DKA.  Will obtain labs and admit the patient.

## 2013-11-04 NOTE — H&P (Signed)
Date: 11/04/2013               Patient Name:  Jason [name] MRN: [id number]  DOB: 08/09/1976 Age / Sex: 37 y.o., male   PCP: Christen Bame, MD         Medical Service: Internal Medicine Teaching Service         Attending Physician: Dr. Farley Ly, MD    First Contact: Dr. Mariea Clonts Pager: [phone]  Second Contact: Dr. Zada Girt Pager: [phone]       After Hours (After 5p/  First Contact Pager: [phone]  weekends / holidays): Second Contact Pager: [phone]   Chief Complaint: Vomiting and diarrhea  History of Present Illness: 89 y o male with PMh of DM2, HTN, Rheumatoid artghritis, HLD.  Presnted today with c/o Vomiting fo rth epast 3 days, say he has been vomiting every 10-9mins, multiple times a day. Non bloody. Also complaints of diarrhe over the past 3 days, has about 2 episodes a day, non bloody. Pt reports that the severity has been the same since vomiting and diarrhea started 3 days ago. There is assoc abdominal pain, generalized also of 3 days duration. Pt also endorses fever and chills and poor appetite present for the past 2 days. There is also a hx of polydypsia but no polyuria. Patient eats home cooked meals, lives with his brother who eats the same meals with no similar symptoms, no hx of sick contacts, no recent use of antibiotics. Pt says he has not taken any insulin in the past 3 days because he has not been eating anything. Pt doesn't smoke cigarettes, takes alcohol occasionally, and denies use of illicit drugs.  Meds: Current Facility-Administered Medications  Medication Dose Route Frequency Provider Last Rate Last Dose  . 0.9 %  sodium chloride infusion   Intravenous Continuous Dow Adolph, MD      . 0.9 %  sodium chloride infusion   Intravenous Continuous Dow Adolph, MD      . aspirin chewable tablet 81 mg  81 mg Oral Daily Farley Ly, MD      . dextrose 5 %-0.45 % sodium chloride infusion   Intravenous Continuous Dow Adolph, MD      .  dextrose 50 % solution 25 mL  25 mL Intravenous PRN Dow Adolph, MD      . enoxaparin (LOVENOX) injection 40 mg  40 mg Subcutaneous Q24H Dow Adolph, MD      . FLUoxetine (PROZAC) capsule 20 mg  20 mg Oral Daily Dow Adolph, MD      . insulin regular (NOVOLIN R,HUMULIN R) 1 Units/mL in sodium chloride 0.9 % 100 mL infusion   Intravenous Continuous Dow Adolph, MD      . potassium chloride 10 mEq in 100 mL IVPB  10 mEq Intravenous Q1H Dow Adolph, MD      . simvastatin (ZOCOR) tablet 5 mg  5 mg Oral q1800 Dow Adolph, MD        Allergies: Allergies as of 11/04/2013  . (No Known Allergies)   Past Medical History  Diagnosis Date  . DKA (diabetic ketoacidoses)     hospitalized in 03/10/08  . Diabetes mellitus     diagnosed in april when admitted to [facility] with blood glucose of 581. HbA1C was 9.9  . Rheumatoid arthritis(714.0)     managed by Dr. Pollyann Savoy. sronegative. Dr. 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  . Latent tuberculosis infection  PPD test measured 12 mm on 05/25/08. CXR on 06/03/08 showed no active disease or adenopathy. Started to take isoniazid 300 mg daily. on 06/11/08 and will  take medication  along with Vit  B6 for 9 months.   Past Surgical History  Procedure Laterality Date  . Hernia repair     Family History  Problem Relation Age of Onset  . Diabetes Cousin   . Hypertension Cousin    History   Social History  . Marital Status: Single    Spouse Name: N/A    Number of Children: N/A  . Years of Education: 9   Occupational History  .     Social History Main Topics  . Smoking status: Never Smoker   . Smokeless tobacco: Not on file  . Alcohol Use: No  . Drug Use: No  . Sexual Activity: Not on file   Other Topics Concern  . Not on file   Social History Narrative  . No narrative on file    Review of Systems: CONSTITUTIONAL- No weightloss, night sweat,or change in  appetite. SKIN- No Rash, colour changes or itching. HEAD- No Headache, or dizziness. EARS- No vertigo, hearing loss, ear discharge. Mouth/throat- Endorses Sorethroat. RESPIRATORY- No Cough, or SOB. CARDIAC- No Palpitations, DOE, PND,or chest pain. GI- No Dysphagia. URINARY- No Frequency, or dysuria. NEUROLOGIC- No Numbness, syncope, or burning.  Physical Exam: Blood pressure 148/93, temperature 98.4 F (36.9 C), temperature source Oral, height 5\' 2"  (1.575 m), weight 122 lb 5.7 oz (55.5 kg). GENERAL- alert, co-operative, appears as stated age, not in any distress, lying in bed comfortably. HEENT- Atraumatic, normocephalic, PERRL, EOMI, oral mucosa appears dry, no cervical LN enlargement, thyroid does not appear enlarged. CARDIAC- Tachycadic, no murmurs, rubs or gallops. RESP- Moving equal volumes of air, and clear to auscultation bilaterally. ABDOMEN- Soft, tender, guarding, but no rebound, no palpable masses or organomegaly, bowel sounds present. BACK- Normal curvature of the spine, No tenderness along the vertebrae, no CVA tenderness. NEURO- Cr N 2-12 intact, strenght equal and present in all extremities, finger to nose test normal bilat, rapid alternating movement- intact EXTREMITIES- pulse 2+, symmetric. SKIN- Warm, dry, No rash or lesion. PSYCH- Normal mood and affect, appropriate thought content and speech.  Lab results: Basic Metabolic Panel:  Recent Labs  13/08/65 1407  NA 136  K 4.4  CL 88*  CO2 14*  GLUCOSE 637*  BUN 35*  CREATININE 1.09  CALCIUM 9.3   Liver Function Tests:  Recent Labs  11/04/13 1407  AST 24  ALT 43  ALKPHOS 145*  BILITOT 0.3  PROT 9.1*  ALBUMIN 4.3    Recent Labs  11/04/13 1407  LIPASE 13   No results found for this basename: AMMONIA,  in the last 72 hours CBC:  Recent Labs  11/04/13 1407  WBC 8.7  NEUTROABS 7.0  HGB 18.2*  HCT 51.5  MCV 88.0  PLT 343   CBG:  Recent Labs  11/04/13 1337 11/04/13 1420  GLUCAP  >600* >600*   Other results: EKG:   Assessment & Plan by Problem: Active Problems:   DIABETES MELLITUS, TYPE II   ESSENTIAL HYPERTENSION   DKA, type 2   DKA (diabetic ketoacidoses)  # DKA- Likely etiology is ?viral gastroenteritis- Vomiting nan ddiarrhea, for the past 2 days without taking his insulin meds. Pt is a type 2 diabetic, on Insulin- NPH- 70/30, 40u- every morning, 35 units at night. Blood sugars drawn in clinic- >600, BMP revealed a bicarbonate of 14, and  an anion gap of 34. Likley due to metabolic acidosis, though ABGs have not be drawn. Considering gap there likley is an ongoing metabolic alkalosis due to vomiting and therefore loss of acid. Patient is also tachycardic due to dehydration. Pt has no hx of CKD, but BUN today- elevated- 35, Cr- WNl- 1.09, but double pts baseline of 0.5-0.6. This is likely due to Pre-renal Failure, from hypovolemia, from hyperglycemia, vomiting and diarrhea. CBC- WBC- 8.7- No concern for infection, Hb- elev at 18.2, likely due to hemoconcentration. Lipase- Normal- 13. - Admit to step down. - NPO. - ABG. - Blood ketones - DKA protocol - insulin- 1 unit/ml in N/s infusion. - IVF- start, then cont at /hr. - IVF change to D5/ 1/2 N/s at . - Odansetron for nausea and vomiting- 4mg  Q8H. - BMP Q2H, till gap closes - CBGs- Q H till blood glucose <250 then switch to D5/1/2 Normal saline at /hr. - K supplimentation at Q4H  # Diabetes- Hom meds-  NPH- 70/30, 40u- every morning, 35 units at night. As per last clinic notes pt was supposed to be on metformin, but this is not on pt med list. Will cont when pt returns to baseline.  # HTN- Blood pressure stable- 124/89. Not on any antiHtns meds at home.  # HLD- Last lipid profile- 03/28/2013 showed LDL- 121. Total- 204. As per last clinic visit in may, pt is supposed to be on Pravachol. But this is not on pt med list.  # DVT ppx- Lovenox.  Dispo: Disposition is deferred at this  time, awaiting improvement of current medical problems.   The patient does have a current PCP Christen Bame, MD) and does need an [facility] hospital follow-up appointment after discharge.  The patient does not know have transportation limitations that hinder transportation to clinic appointments.  Signed: Kennis Carina, MD 11/04/2013, 4:41 PM

## 2013-11-04 NOTE — Progress Notes (Signed)
Report called to Nurse on 2C.  Pt transported via wheelchair to 2C -5.  Pt remains oriented.  Up to bathroom vomited.  Ambulated to bed.  IV intact in left wrist area.  Angelina Ok, RN 11/04/2013 4:15Pm

## 2013-11-05 LAB — BASIC METABOLIC PANEL
BUN: 21 mg/dL (ref 6–23)
BUN: 18 mg/dL (ref 6–23)
BUN: 16 mg/dL (ref 6–23)
BUN: 12 mg/dL (ref 6–23)
BUN: 11 mg/dL (ref 6–23)
CO2: 22 mEq/L (ref 19–32)
CO2: 25 mEq/L (ref 19–32)
CO2: 25 mEq/L (ref 19–32)
Calcium: 8.3 mg/dL — ABNORMAL LOW (ref 8.4–10.5)
Calcium: 7.8 mg/dL — ABNORMAL LOW (ref 8.4–10.5)
Calcium: 7.8 mg/dL — ABNORMAL LOW (ref 8.4–10.5)
Calcium: 7.8 mg/dL — ABNORMAL LOW (ref 8.4–10.5)
Calcium: 7.8 mg/dL — ABNORMAL LOW (ref 8.4–10.5)
Calcium: 8 mg/dL — ABNORMAL LOW (ref 8.4–10.5)
Chloride: 107 mEq/L (ref 96–112)
Chloride: 106 mEq/L (ref 96–112)
Chloride: 106 mEq/L (ref 96–112)
Chloride: 104 mEq/L (ref 96–112)
Creatinine, Ser: 0.53 mg/dL (ref 0.50–1.35)
Creatinine, Ser: 0.45 mg/dL — ABNORMAL LOW (ref 0.50–1.35)
Creatinine, Ser: 0.51 mg/dL (ref 0.50–1.35)
Creatinine, Ser: 0.39 mg/dL — ABNORMAL LOW (ref 0.50–1.35)
Creatinine, Ser: 0.45 mg/dL — ABNORMAL LOW (ref 0.50–1.35)
GFR calc Af Amer: >90 mL/min (ref >90)
GFR calc Af Amer: >90 mL/min (ref >90)
GFR calc Af Amer: >90 mL/min (ref >90)
GFR calc Af Amer: >90 mL/min (ref >90)
GFR calc non Af Amer: >90 mL/min (ref >90)
GFR calc non Af Amer: >90 mL/min (ref >90)
GFR calc non Af Amer: >90 mL/min (ref >90)
GFR calc non Af Amer: >90 mL/min (ref >90)
Glucose, Bld: 134 mg/dL — ABNORMAL HIGH (ref 70–99)
Glucose, Bld: 165 mg/dL — ABNORMAL HIGH (ref 70–99)
Glucose, Bld: 129 mg/dL — ABNORMAL HIGH (ref 70–99)
Glucose, Bld: 101 mg/dL — ABNORMAL HIGH (ref 70–99)
Potassium: 4 mEq/L (ref 3.5–5.1)
Potassium: 3.9 mEq/L (ref 3.5–5.1)
Potassium: 3.5 mEq/L (ref 3.5–5.1)
Sodium: 138 mEq/L (ref 135–145)
Sodium: 137 mEq/L (ref 135–145)
Sodium: 136 mEq/L (ref 135–145)

## 2013-11-05 LAB — GLUCOSE, CAPILLARY
Glucose-Capillary: 191 mg/dL — ABNORMAL HIGH (ref 70–99)
Glucose-Capillary: 146 mg/dL — ABNORMAL HIGH (ref 70–99)
Glucose-Capillary: 140 mg/dL — ABNORMAL HIGH (ref 70–99)
Glucose-Capillary: 146 mg/dL — ABNORMAL HIGH (ref 70–99)
Glucose-Capillary: 120 mg/dL — ABNORMAL HIGH (ref 70–99)
Glucose-Capillary: 130 mg/dL — ABNORMAL HIGH (ref 70–99)
Glucose-Capillary: 159 mg/dL — ABNORMAL HIGH (ref 70–99)
Glucose-Capillary: 173 mg/dL — ABNORMAL HIGH (ref 70–99)
Glucose-Capillary: 165 mg/dL — ABNORMAL HIGH (ref 70–99)
Glucose-Capillary: 233 mg/dL — ABNORMAL HIGH (ref 70–99)
Glucose-Capillary: 208 mg/dL — ABNORMAL HIGH (ref 70–99)
Glucose-Capillary: 153 mg/dL — ABNORMAL HIGH (ref 70–99)
Glucose-Capillary: 113 mg/dL — ABNORMAL HIGH (ref 70–99)
Glucose-Capillary: 205 mg/dL — ABNORMAL HIGH (ref 70–99)
Glucose-Capillary: 182 mg/dL — ABNORMAL HIGH (ref 70–99)

## 2013-11-05 MED ORDER — INSULIN GLARGINE 100 UNIT/ML ~~LOC~~ SOLN
10.0000 [IU] | Freq: Every day | SUBCUTANEOUS | Status: DC
  Administered 2013-11-05 – 2013-11-06 (×2): 10 [IU] via SUBCUTANEOUS
  Filled 2013-11-05 (×3): qty 0.1

## 2013-11-05 MED ORDER — LIVING WELL WITH DIABETES BOOK - IN SPANISH
Freq: Once | Status: DC
  Filled 2013-11-05: qty 1

## 2013-11-05 MED ORDER — POTASSIUM CHLORIDE CRYS ER 20 MEQ PO TBCR
40.0000 meq | EXTENDED_RELEASE_TABLET | Freq: Once | ORAL | Status: AC
  Administered 2013-11-05: 40 meq via ORAL
  Filled 2013-11-05: qty 2

## 2013-11-05 MED ORDER — POTASSIUM CHLORIDE CRYS ER 20 MEQ PO TBCR
40.0000 meq | EXTENDED_RELEASE_TABLET | Freq: Every day | ORAL | Status: DC
  Administered 2013-11-05 – 2013-11-08 (×4): 40 meq via ORAL
  Filled 2013-11-05 (×4): qty 2

## 2013-11-05 NOTE — H&P (Signed)
Internal Medicine Attending Admission Note Date: 11/05/2013  Patient name: Jason [name] Medical record number: [id number] Date of birth: [birth year] Age: 37 y.o. Gender: male  I saw and evaluated the patient. I reviewed the resident's note and I agree with the resident's findings and plan as documented in the resident's note, with the following additional comments.  Chief Complaint(s): Nausea, vomiting, abdominal pain  History - key components related to admission: Patient is a 37 year old man with history of diabetes mellitus type 2, hypertension, and other problems as outlined in the medical history admitted with a three-day history of nausea, vomiting, abdominal pain, and diarrhea.  He reports that he took his home insulin regularly until his symptoms began.  He denies any known sick contacts.  He denies any blood in his emesis or stool.   Physical Exam - key components related to admission:  Filed Vitals:   11/05/13 0400 11/05/13 0431 11/05/13 0500 11/05/13 0751  BP: 111/78 111/78 109/73 109/75  Pulse: 96  92 73  Temp:  98.9 F (37.2 C)  98.5 F (36.9 C)  TempSrc:  Oral  Oral  Resp: 20  19 16   Height:      Weight:      SpO2: 98%  97% 98%   General: Alert, no distress Lungs: Clear Back: No CVA tenderness Heart: Regular; S1-S2, no S3, no S4, no murmurs Abdomen: Bowel sounds present, soft, mild upper abdominal tenderness; no hepatosplenomegaly; no guarding or rebound Extremities: No edema  Lab results:   Basic Metabolic Panel:   Recent Labs  11/04/13 1407 11/04/13 1810 11/04/13 2010 11/05/13 0040 11/05/13 0211 11/05/13 0512 11/05/13 0833 11/05/13 1040  NA 136 139 140 140 140 138 138 137  K 4.4 4.2 3.6 4.1 4.0 4.1 3.9 4.0  CL 88* 97 102 105 107 107 106 106  CO2 14* 13* 17* 22 20 21 25 24   BUN 35* 31* 27* 21 18 16 13 12   CREATININE 1.09 0.85 0.66 0.53 0.46* 0.45* 0.51 0.39*  GLUCOSE 637* 427* 305* 134* 165* 215* 126* 129*  CALCIUM 9.3 8.4 8.8 8.3* 7.8*  7.9* 7.8* 7.8*    Liver Function Tests:  Recent Labs  11/04/13 1407  AST 24  ALT 43  ALKPHOS 145*  BILITOT 0.3  PROT 9.1*  ALBUMIN 4.3    Recent Labs  11/04/13 1407  LIPASE 13    CBC:  Recent Labs  11/04/13 1407 11/04/13 1810  WBC 8.7 8.1  HGB 18.2* 16.8  HCT 51.5 47.3  MCV 88.0 88.7  PLT 343 298     Recent Labs  11/04/13 1407  NEUTROABS 7.0  LYMPHSABS 0.9  MONOABS 0.9  EOSABS 0.0  BASOSABS 0.0    CBG:  Recent Labs  11/05/13 0714 11/05/13 0753 11/05/13 0820 11/05/13 0922 11/05/13 1031 11/05/13 1135  GLUCAP 150* 140* 146* 120* 113* 130*    Hemoglobin A1C:  Recent Labs  11/04/13 1407  HGBA1C 11.6*    Urinalysis    Component Value Date/Time   COLORURINE yellow 11/15/2010 1034   APPEARANCEUR Hazy 11/15/2010 1034   LABSPEC >=1.030 11/15/2010 1034   PHURINE 6.0 11/15/2010 1034   GLUCOSEU >1000* 03/10/2008 1726   HGBUR negative 11/15/2010 1034   HGBUR NEGATIVE 03/10/2008 1726   BILIRUBINUR small 11/15/2010 1034   KETONESUR >80* 03/10/2008 1726   PROTEINUR NEGATIVE 03/10/2008 1726   UROBILINOGEN 0.2 03/10/2008 1726   NITRITE negative 11/15/2010 1034   LEUKOCYTESUR NEGATIVE 03/10/2008 1726    Assessment & Plan by Problem:  1.  Diabetic ketoacidosis.  Patient has symptoms suggesting a viral gastroenteritis, and subsequently stopped taking his insulin or eating; this likely precipitated his DKA.  His elevated hemoglobin A1c reflects poor control chronically.  His DKA has corrected with IV insulin and volume replacement.  He still has some nausea this morning, and the plan is to start clear liquids and advance as tolerated.  If he is able to take P.O., then will transition to subcutaneous insulin regimen today.  Diabetes education and followup are important given his poor chronic control.  2.  Nausea, vomiting, and abdominal pain.  The etiology is not clear; history suggest a possible viral gastroenteritis.  Patient reports improvement in his  symptoms today, although he still has some nausea.  He has very mild nonfocal tenderness on his abdominal exam.  The plan is advance diet as above starting with clear liquids; symptomatic treatment; if symptoms persist, then further workup will be warranted.  3.  See note by resident physician for other problems and plans.

## 2013-11-05 NOTE — Progress Notes (Signed)
Subjective: Still has persistent nausea and no appetite yet. Some diffuse abdominal pain still present. Last episode of vomiting was last night-one episode, one episode of diarrhea last night also. No fever. Feels abit better than yesterday. Claims he is compliant with his medication- NPH- regular insulin, takes it everyday, but not in the past 3 days due to pain.  Objective: Vital signs in last 24 hours: Filed Vitals:   11/05/13 0400 11/05/13 0431 11/05/13 0500 11/05/13 0751  BP: 111/78 111/78 109/73 109/75  Pulse: 96  92 73  Temp:  98.9 F (37.2 C)  98.5 F (36.9 C)  TempSrc:  Oral  Oral  Resp: 20  19 16   Height:      Weight:      SpO2: 98%  97% 98%   Weight change:   Intake/Output Summary (Last 24 hours) at 11/05/13 1241 Last data filed at 11/04/13 2148  Gross per 24 hour  Intake    200 ml  Output    900 ml  Net   -700 ml   General appearance: alert, cooperative, appears stated age and no distress Lungs: clear to auscultation bilaterally. Heart: regular rate and rhythm, S1, S2 normal, no murmur, click, rub or gallop Abdomen: soft, non-tender; bowel sounds normal; no masses,  no organomegaly Extremities: extremities normal, atraumatic, no cyanosis or edema Pulses: 2+ and symmetric Lab Results: Basic Metabolic Panel:  Recent Labs Lab 11/05/13 0833 11/05/13 1040  NA 138 137  K 3.9 4.0  CL 106 106  CO2 25 24  GLUCOSE 126* 129*  BUN 13 12  CREATININE 0.51 0.39*  CALCIUM 7.8* 7.8*   Liver Function Tests:  Recent Labs Lab 11/04/13 1407  AST 24  ALT 43  ALKPHOS 145*  BILITOT 0.3  PROT 9.1*  ALBUMIN 4.3    Recent Labs Lab 11/04/13 1407  LIPASE 13   No results found for this basename: AMMONIA,  in the last 168 hours CBC:  Recent Labs Lab 11/04/13 1407 11/04/13 1810  WBC 8.7 8.1  NEUTROABS 7.0  --   HGB 18.2* 16.8  HCT 51.5 47.3  MCV 88.0 88.7  PLT 343 298   Cardiac Enzymes: No results found for this basename: CKTOTAL, CKMB, CKMBINDEX,  TROPONINI,  in the last 168 hours BNP: No results found for this basename: PROBNP,  in the last 168 hours D-Dimer: No results found for this basename: DDIMER,  in the last 168 hours CBG:  Recent Labs Lab 11/05/13 0714 11/05/13 0753 11/05/13 0820 11/05/13 0922 11/05/13 1031 11/05/13 1135  GLUCAP 150* 140* 146* 120* 113* 130*   Hemoglobin A1C:  Recent Labs Lab 11/04/13 1407  HGBA1C 11.6*    Micro Results: Recent Results (from the past 240 hour(s))  MRSA PCR SCREENING     Status: None   Collection Time    11/04/13  4:30 PM      Result Value Range Status   MRSA by PCR NEGATIVE  NEGATIVE Final   Comment:            The GeneXpert MRSA Assay (FDA     approved for NASAL specimens     only), is one component of a     comprehensive MRSA colonization     surveillance program. It is not     intended to diagnose MRSA     infection nor to guide or     monitor treatment for     MRSA infections.   Studies/Results: No results found. Medications: I have reviewed  the patient's current medications. Scheduled Meds: . aspirin  81 mg Oral Daily  . enoxaparin (LOVENOX) injection  40 mg Subcutaneous Q24H  . FLUoxetine  20 mg Oral Daily  . potassium chloride  40 mEq Oral Daily  . simvastatin  5 mg Oral q1800   Continuous Infusions: . dextrose 5 % and 0.45% NaCl 1,000 mL (11/05/13 0855)  . insulin (NOVOLIN-R) infusion 1.4 Units/hr (11/05/13 1139)   PRN Meds:.dextrose, ondansetron Assessment/Plan: Principal Problem:   DKA (diabetic ketoacidoses) Active Problems:   DIABETES MELLITUS, TYPE II   ESSENTIAL HYPERTENSION  # DKA- Provoked by ?virla gastroenteritis- Vomiting nan ddiarrhea, for 3 day sprior to presentation. But vomiting and abdominal pain may be due DKA. Home meds on Insulin- NPH- 70/30, 40u- every morning, 35 units at night. On admission- Blood sugars drawn in clinic- >600, BMP revealed a bicarbonate of 14, and an anion gap of 34. Pt had a metabolic acidosis- explained  by DKA with concurrent metabolic alkalosis- explained by vomiting. Patient was also tachycardic due to dehydration. BUN/Cr ratio elevated >20 on admission- likley pre-renal renal failure, which has resolved with hydration. CBC revealed a WBC- 8.7- No concern for infection, Hb- elev at 18.2, likely due to hemoconcentration. Lipase- Normal- 13.  - Anion gap has closed- last BMP- 12noon, gap- 7.  - DKA protocol  - Pt says he doesn't have an appetite, with some persistent nausea, so Started Pt on clear diet, will cont insulin infusion. - Advance diet as tolerated.  - IVF change to D5/ 1/2 N/s at  195mls/hr - Odansetron for nausea and vomiting- 4mg  Q8H.  - CBGs- Q 1 H  - K supplimentation at daily.   # Diabetes- Hom meds- NPH- 70/30, 40u- every morning, 35 units at night. As per last clinic notes pt was supposed to be on metformin, but this is not on pt med list. Will cont when pt returns to baseline. Last HbA1c- 11.6. 11/04/2013. Reflects chronic poor control, but pt claims compliance with med. Will req adjustment on discharge or as an out-pt.  # HTN- Blood pressure stable- 101/67. Not on any anti-Htns meds at home.   # HLD- Last lipid profile- 03/28/2013 showed LDL- 121. Total- 204. As per last clinic visit in may, pt is supposed to be on Pravachol. But this was not on pt med list.   # DVT ppx- Lovenox.  Dispo: Disposition is deferred at this time, awaiting improvement of current medical problems.  Anticipated discharge in approximately 1-2 day(s).   The patient does have a current PCP Christen Bame, MD) and does need an [facility] hospital follow-up appointment after discharge.  The patient does not know have transportation limitations that hinder transportation to clinic appointments.  .Services Needed at time of discharge: Y = Yes, Blank = No PT:   OT:   RN:   Equipment:   Other:     LOS: 1 day   Kennis Carina, MD 11/05/2013, 12:41 PM

## 2013-11-05 NOTE — Progress Notes (Signed)
Utilization Review Completed.Jason [name] T12/17/2014  

## 2013-11-05 NOTE — Progress Notes (Signed)
Inpatient Diabetes Program Recommendations  AACE/ADA: New Consensus Statement on Inpatient Glycemic Control (2013)  Target Ranges:  Prepandial:   less than 140 mg/dL      Peak postprandial:   less than 180 mg/dL (1-2 hours)      Critically ill patients:  140 - 180 mg/dL   Results for JUWON, SCRIPTER (MRN [id number]) as of 11/05/2013 14:39  Ref. Range 11/05/2013 08:20 11/05/2013 09:22 11/05/2013 10:31 11/05/2013 11:35 11/05/2013 12:39  Glucose-Capillary Latest Range: 70-99 mg/dL 366 (H) 440 (H) 347 (H) 130 (H) 159 (H)    Inpatient Diabetes Program Recommendations Insulin - Basal: Patient meets criteria to transition off of the insulin drip but noted in progress note for today by Dr. Mariea Clonts that patient will remain on insulin drip via Glucostabilizer due to nausea and no appetite.  Once patient is transitioned from IV insulin to SQ insulin, would recommend using NPH versus 70/30 due to poor appetite.  Recommend starting with  NPH 20 units BID. Correction (SSI): Once patient is transitioned from IV insulin to SQ insulin, please consider ordering Novolog moderate correction scale Q4H (if patient still not eating well).  Note: Patient has a history of diabetes and is prescribed 70/30 40 units with breakfast and 70/30 35 units with supper as an outpatient for diabetes management.  Initial lab glucose noted to be 637 mg/dl and patient was in DKA with anion gap of 34 and was placed on an insulin drip via GlucoStabilizer.  Currently, patient remains on Novolin R insulin drip via GlucoStabilzer for inpatient glycemic control.  Noted that Dr. Mariea Clonts wanted to keep patient on insulin drip due to poor appetite and persistent nausea.  Once patient is transitioned from IV insulin to SQ insulin, would not recommend using 70/30 due to poor PO intake.  Instead would recommend using NPH and recommend starting with NPH 20 units BID with Novolog moderate correction scale.  Will continue to follow.  Thanks, Orlando Penner, RN, MSN, CCRN Diabetes Coordinator Inpatient Diabetes Program [phone] (Team Pager) [phone] (AP office) [phone] [facility] office)

## 2013-11-06 ENCOUNTER — Telehealth: Payer: Self-pay | Admitting: Dietician

## 2013-11-06 ENCOUNTER — Encounter (HOSPITAL_COMMUNITY): Payer: Self-pay | Admitting: Emergency Medicine

## 2013-11-06 LAB — GLUCOSE, CAPILLARY
Glucose-Capillary: 109 mg/dL — ABNORMAL HIGH (ref 70–99)
Glucose-Capillary: 150 mg/dL — ABNORMAL HIGH (ref 70–99)

## 2013-11-06 LAB — URINALYSIS, ROUTINE W REFLEX MICROSCOPIC
Bilirubin Urine: NEGATIVE
Hgb urine dipstick: NEGATIVE
Ketones, ur: 15 mg/dL — AB
Nitrite: NEGATIVE
Specific Gravity, Urine: 1.018 (ref 1.005–1.030)
pH: 7.5 (ref 5.0–8.0)

## 2013-11-06 LAB — HEPATIC FUNCTION PANEL
AST: 22 U/L (ref 0–37)
Albumin: 2.8 g/dL — ABNORMAL LOW (ref 3.5–5.2)
Alkaline Phosphatase: 72 U/L (ref 39–117)
Bilirubin, Direct: 0.1 mg/dL (ref 0.0–0.3)
Indirect Bilirubin: 0.4 mg/dL (ref 0.3–0.9)
Total Bilirubin: 0.5 mg/dL (ref 0.3–1.2)
Total Protein: 5.5 g/dL — ABNORMAL LOW (ref 6.0–8.3)

## 2013-11-06 LAB — BASIC METABOLIC PANEL
BUN: 5 mg/dL — ABNORMAL LOW (ref 6–23)
CO2: 23 mEq/L (ref 19–32)
Chloride: 107 mEq/L (ref 96–112)
Creatinine, Ser: 0.36 mg/dL — ABNORMAL LOW (ref 0.50–1.35)
GFR calc Af Amer: >90 mL/min (ref >90)
GFR calc non Af Amer: >90 mL/min (ref >90)

## 2013-11-06 LAB — SEDIMENTATION RATE: Sed Rate: 1 mm/hr (ref 0–16)

## 2013-11-06 LAB — URINE MICROSCOPIC-ADD ON

## 2013-11-06 LAB — HIV ANTIBODY (ROUTINE TESTING W REFLEX): HIV: NONREACTIVE

## 2013-11-06 MED ORDER — ACETAMINOPHEN 325 MG PO TABS: 650.0000 mg | ORAL_TABLET | Freq: Four times a day (QID) | ORAL | Status: DC | PRN

## 2013-11-06 MED ORDER — INSULIN ASPART 100 UNIT/ML ~~LOC~~ SOLN
0.0000 [IU] | SUBCUTANEOUS | Status: DC
  Administered 2013-11-06: 3 [IU] via SUBCUTANEOUS
  Administered 2013-11-06: 1 [IU] via SUBCUTANEOUS
  Administered 2013-11-06: 21:00:00 3 [IU] via SUBCUTANEOUS
  Administered 2013-11-06: 1 [IU] via SUBCUTANEOUS
  Administered 2013-11-06: 2 [IU] via SUBCUTANEOUS
  Administered 2013-11-07: 5 [IU] via SUBCUTANEOUS
  Administered 2013-11-07: 2 [IU] via SUBCUTANEOUS
  Administered 2013-11-07 (×2): 3 [IU] via SUBCUTANEOUS
  Administered 2013-11-07: 05:00:00 1 [IU] via SUBCUTANEOUS
  Administered 2013-11-08: 2 [IU] via SUBCUTANEOUS
  Administered 2013-11-08: 1 [IU] via SUBCUTANEOUS
  Administered 2013-11-08 (×2): 2 [IU] via SUBCUTANEOUS

## 2013-11-06 MED ORDER — IOHEXOL 300 MG/ML  SOLN
25.0000 mL | INTRAMUSCULAR | Status: AC
  Administered 2013-11-06 – 2013-11-07 (×2): 25 mL via ORAL

## 2013-11-06 MED ORDER — SODIUM CHLORIDE 0.9 % IV SOLN
INTRAVENOUS | Status: DC
  Administered 2013-11-06: 1000 mL via INTRAVENOUS
  Administered 2013-11-06: 999 mL via INTRAVENOUS

## 2013-11-06 MED ORDER — IOHEXOL 300 MG/ML  SOLN: 25.0000 mL | INTRAMUSCULAR | Status: AC

## 2013-11-06 NOTE — Progress Notes (Addendum)
Nurse called report to [facility] on [address].  Pt was alert and oriented prior transport, belongings carried with pt to new room.  Contrast Media will be carried by CT to new room on [address], Vikki Ports ([address] night nurse) was contacted and informed.

## 2013-11-06 NOTE — Progress Notes (Signed)
I saw and evaluated the patient.  I personally confirmed the key portions of the history and exam documented by Dr. Wilson and I reviewed pertinent patient test results.  The assessment, diagnosis, and plan were formulated together and I agree with the documentation in the resident's note. 

## 2013-11-06 NOTE — Telephone Encounter (Signed)
Patient is uninsured therefore has not meter coverage. Agree with giving patient information on low cost meters.

## 2013-11-06 NOTE — Progress Notes (Signed)
Internal Medicine Attending  Date: 11/06/2013  Patient name: Jason [name] Medical record number: [id number] Date of birth: 1976/06/07 Age: 37 y.o. Gender: male  I saw and evaluated the patient and discussed his care on a.m. rounds with house staff.  His DKA has resolved, and he was transitioned off of insulin drip overnight and received 10 units of Lantus insulin.  This morning he reported no appetite and had not eaten his breakfast, which was at the bedside.  He complains of some abdominal pain; exam shows soft abdomen with no guarding but mild nonfocal tenderness.  He will need to be eating etter before we can resume substantial doses of basal insulin.  The plan is to follow his blood sugars today and use correction insulin as indicated; we encouraged him to try eating; will check lipase, hepatic profile, and urinalysis.  If his abdominal pain persists, then we will need to consider abdominal imaging.  Please see resident physician's note for details of clinical findings and plans.

## 2013-11-06 NOTE — Care Management Note (Addendum)
    Page 1 of 1   11/07/2013     11:49:05 AM   CARE MANAGEMENT NOTE 11/07/2013  Patient:  Jason [name], Jason [name]   Account Number:  [id number]  Date Initiated:  11/06/2013  Documentation initiated by:  Junius Creamer  Subjective/Objective Assessment:   adm w dka     Action/Plan:   lives w fam , pcp dr Arna Medici sadek  [facility].   Anticipated DC Date:  11/08/2013   Anticipated DC Plan:  HOME/SELF CARE      DC Planning Services  CM consult  MATCH Program      Choice offered to / List presented to:             Status of service:   Medicare Important Message given?   (If response is "NO", the following Medicare IM given date fields will be blank) Date Medicare IM given:   Date Additional Medicare IM given:    Discharge Disposition:  HOME/SELF CARE  Per UR Regulation:  Reviewed for med. necessity/level of care/duration of stay  If discussed at Long Length of Stay Meetings, dates discussed:    Comments:  11/07/13 11:46 Letha Cape RN, BSN [phone] patient pcp is in [facility], NCM spoke with Rudell Cobb , she stated that patient had an appt to see her to get an orange card but he never came to apt.  She rescheduled an apt for patient on 1/6 at 10 am to apply for orange card.  This information was explained to patient. Also at dc patient will need Match Letter out of shawdow chart.  12/18 1333p debbie dowell rn,bsn have filled out match letter and will leave in shadow chart. will give pt prescription discount card that may help w brand name meds. explained match to pt and reminded him to ask for letter at disch.

## 2013-11-06 NOTE — Progress Notes (Signed)
Spoke with paient about diabetes and outpatient regimen for diabetes management.  Patient reports that he takes his insulin 70/30 40 units QAM and 70/30 35 units QPM regularly and states that he did not take it over the past 3 days due to pain and nausea.  When asked about checking his blood sugar, he reports that he does not check his blood sugar because he does not have a glucometer.  When asked about blood sugars when he was checking his blood sugar, he reports that his blood glucose was usually in the 300's mg/dl range.  According to the patient he is followed by the [facility] and calls them when he feels like he needs to be seen.  Inquired about knowledge of A1C and if he knew what an A1C was.  However, patient reports that he does not know what that is.  Discussed A1C results (11.6% on 11/04/2013) and explained what an A1C is, basic pathophysiology of DM, basic home care, importance of checking CBGs and maintaining good CBG control to prevent long-term and short-term complications.  Patient does report that he does not have any insurance and that he is getting insulin from [facility] but he feels that he needs to see if he can get help with  getting his insulins when he is discharged.  Will consult Case Management. Patient verbalized understanding of information discussed and states that he does not have any further questions at this time.  Will continue to follow.   Called Norm Parcel, RD, CDE in the [facility] Medicine Clinic and left a message on her voicemail inquiring about glucometer to see if she has one that can be provided to the patient.  Provided patient with handout information on Reli-On glucometer that can be purchased at [facility] for $16.84 and a box of 50 test strips is $9.00.  Thanks, Orlando Penner, RN, MSN, CCRN Diabetes Coordinator Inpatient Diabetes Program [phone] (Team Pager) [phone] (AP office) [phone] [facility] office)

## 2013-11-06 NOTE — Progress Notes (Addendum)
Pt experienced episode of 4 beat V tach. MD made aware.  Per MD transfer order to Med Surg will now be d/c and changed to Telemetry.

## 2013-11-06 NOTE — Progress Notes (Addendum)
NURSING PROGRESS NOTE  Jason [name] [id number] Admission Data: 11/06/2013 2005 Attending Provider: Farley Ly, MD HQI:ONGEX, Arna Medici, MD Code Status: Full  Jason [name] is a 37 y.o. male patient admitted from ED:  -No acute distress noted.  -No complaints of shortness of breath.  -No complaints of chest pain.   Cardiac Monitoring: Box # [id number] in place. Cardiac monitor yields:normal sinus rhythm.  Blood pressure 120/74, pulse 76, temperature 99.2 F (37.3 C), temperature source Oral, resp. rate 18, height 5\' 2"  (1.575 m), weight 59.7 kg (131 lb 9.8 oz), SpO2 99.00%.   IV Fluids:  IV in place, occlusive dsg intact without redness, IV cath wrist left, condition patent and no redness normal saline. RFA NSL, patent, no redness  Allergies:  Review of patient's allergies indicates no known allergies.  Past Medical History:   has a past medical history of DKA (diabetic ketoacidoses); Diabetes mellitus; Rheumatoid arthritis(714.0); and Latent tuberculosis infection.  Past Surgical History:   has past surgical history that includes Hernia repair.  Social History:   reports that he has never smoked. He has never used smokeless tobacco. He reports that he does not drink alcohol or use illicit drugs.  Skin: WNL  Patient oriented to room. Information packet given to patient. Admission inpatient armband information verified with patient to include name and date of birth and placed on patient arm. Side rails up x 2, fall assessment and education completed with patient. Patient able to verbalize understanding of risk associated with falls and verbalized understanding to call for assistance before getting out of bed. Call light within reach. Patient able to voice and demonstrate understanding of unit orientation instructions.

## 2013-11-06 NOTE — Progress Notes (Signed)
Subjective: Talked to pt extensively using the interpreter phone. Pt endorses loosing about 20 lbs of weight he says over the past month, not intentionally. Also say he has been having poor appetite for about a year. No chronic diarrhea or vomiting. No chronic fevers, but does say he has been having night sweats, waking up soaked in sweats for the past 2 months. Also complains of weakness. Pt say she has been coughing everyday for the past 2 years, no nasal congestion, not worse during certain times of the year. Cough is productive of yellowish/sometimes white sputum, but no blood, no hx of contacts with anybody coughing, no sick contacts. Pt also complains of chest pain for the past 3-4 years he says, sudden, sharp pain in his mid-lower sternal area, no known aggravating factors, not radiating, not related to exertion, relieved by staying still, not related to food, no dark stools, no assoc SOB, no back pain, no family hx of cardiac dx. Pt works in [employer], pain was not present at this time, last felt the pain yesterday. Abdominal pain, started on Sunday- 4 days ago, with this recent illness of vomiting and diarrhea. Last vomitus- yesterday, and last bowel movement was yesterday, say stools are getting more solid and diarrhea is resolving.  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 Pt says he ate a little this morning, he currently still doesn't have much of an appetite but he will try to eat his dinner.   Objective: Vital signs in last 24 hours: Filed Vitals:   11/06/13 0710 11/06/13 0802 11/06/13 0900 11/06/13 1200  BP:  108/70 103/70 115/60  Pulse:  79  82  Temp:  97.9 F (36.6 C)  98.1 F (36.7 C)  TempSrc:  Oral  Oral  Resp: 18  19 20 21   Height:      Weight:      SpO2:       Weight change: 0 lb (0 kg)  Intake/Output Summary (Last 24 hours) at 11/06/13 1614 Last data filed at 11/06/13 1500  Gross per 24 hour  Intake 1259.6 ml  Output    200 ml  Net 1059.6 ml   General appearance: alert, cooperative, appears stated age and no distress Lungs: No crackles, or wheezes. Heart: regular rate and rhythm, S1, S2 normal, no murmur, click, rub or gallop Abdomen: soft, mild non specific tenderness, bowel sounds normal; no masses,  no organomegaly Extremities: extremities normal, atraumatic, no cyanosis or edema Pulses: 2+ and symmetric.  Lab Results: Basic Metabolic Panel:  Recent Labs Lab 11/05/13 2020 11/06/13 0415  NA 136 137  K 3.5 3.8  CL 104 107  CO2 25 23  GLUCOSE 101* 136*  BUN 7 5*  CREATININE 0.40* 0.36*  CALCIUM 8.0* 7.7*   Liver Function Tests:  Recent Labs Lab 11/04/13 1407 11/06/13 0430  AST 24 22  ALT 43 19  ALKPHOS 145* 72  BILITOT 0.3 0.5  PROT 9.1* 5.5*  ALBUMIN 4.3 2.8*    Recent Labs Lab 11/04/13 1407 11/06/13 0430  LIPASE 13 190*   No results found for this basename: AMMONIA,  in the last 168 hours CBC:  Recent Labs Lab 11/04/13 1407 11/04/13 1810  WBC 8.7 8.1  NEUTROABS 7.0  --   HGB  18.2* 16.8  HCT 51.5 47.3  MCV 88.0 88.7  PLT 343 298   CBG:  Recent Labs Lab 11/05/13 2340 11/06/13 0035 11/06/13 0143 11/06/13 0411 11/06/13 0749 11/06/13 1207  GLUCAP 164* 109* 98 137* 150* 169*   Hemoglobin A1C:  Recent Labs Lab 11/04/13 1407  HGBA1C 11.6*    Micro Results: Recent Results (from the past 240 hour(s))  MRSA PCR SCREENING     Status: None   Collection Time    11/04/13  4:30 PM      Result Value Range Status   MRSA by PCR NEGATIVE  NEGATIVE Final   Comment:            The GeneXpert MRSA Assay (FDA     approved for NASAL specimens     only), is one component of a     comprehensive MRSA colonization     surveillance program. It is not       intended to diagnose MRSA     infection nor to guide or     monitor treatment for     MRSA infections.   Studies/Results: No results found. Medications: I have reviewed the patient's current medications. Scheduled Meds: . aspirin  81 mg Oral Daily  . enoxaparin (LOVENOX) injection  40 mg Subcutaneous Q24H  . FLUoxetine  20 mg Oral Daily  . insulin aspart  0-9 Units Subcutaneous Q4H  . insulin glargine  10 Units Subcutaneous QHS  . living well with diabetes book- in spanish   Does not apply Once  . potassium chloride  40 mEq Oral Daily  . simvastatin  5 mg Oral q1800   Continuous Infusions: . sodium chloride 1,000 mL (11/06/13 1219)   PRN Meds:.acetaminophen, dextrose, ondansetron Assessment/Plan: Principal Problem:   DKA (diabetic ketoacidoses) Active Problems:   DIABETES MELLITUS, TYPE II   ESSENTIAL HYPERTENSION  # DKA- Resolved. Provoked by virla gastroenteritis- Vomiting and diarrhea, for 3 days prior to presentation and not taking his insulin meds. But vomiting and abdominal pain may be due DKA. Home meds on Insulin- NPH- 70/30, 40u- every morning, 35 units at night. Pt still reports that he will try to eat this evening, as he feels better, but appetite has not been fully restored. - Odansetron for nausea and vomiting- 4mg  Q8H.  - CBGs-  Q4H. - K supplimentation at daily. - Transfer to regular floor- tele, nurse reported pt had 4 beats of V. Tach.   # Diabetes- Hom meds- NPH- 70/30, 40u- every morning, 35 units at night. As per last clinic notes pt was supposed to be on metformin, but this is not on pt med list. Will cont when pt returns to baseline. Last HbA1c- 11.6. 11/04/2013. Reflects chronic poor control, but pt claims compliance with med. Will req adjustment on discharge or as an out-pt.  # HTN- Blood pressure stable- 101/67. Not on any anti-Htns meds at home.   # HLD- Last lipid profile- 03/28/2013 showed LDL- 121. Total- 204. As per last clinic visit in  may, pt is supposed to be on Pravachol. But this was not on pt med list.  # Chronic Cough and chest pain-  Aetiolgy unclear at this time. As per chart review pt presented 09/11/2018, with complaint sof chest pain, assessment was costochondritis, also mentioned was a hx of TB. Pt also presented 11/15/2010 with c/o of fever, night sweats, chills and worsening dry cough- Had a chest xray which was normal. Last chest xray done-01/03/2013 was showed no consolidation or  effusion. Also WBC not elevated, on this admission 11/04/2013- 8.1. - Chest xray - ESR - Quantiferon assay. - HIV test pending.  # Abdominal Pain- Acute, diffuse. No identifiable etiology. Considerations Abdominal malignancy and pancreatitis. - LFTs - UA for none specfic abdminal pain - Lipase- Elevated 190 - Abdominal CT with contrast.   # DVT ppx- Lovenox.  Dispo: Disposition is deferred at this time, awaiting improvement of current medical problems.   The patient does have a current PCP Christen Bame, MD) and does need an [facility] hospital follow-up appointment after discharge.  The patient does not know have transportation limitations that hinder transportation to clinic appointments.  .Services Needed at time of discharge: Y = Yes, Blank = No PT:   OT:   RN:   Equipment:   Other:     LOS: 2 days   Kennis Carina, MD 11/06/2013, 4:14 PM

## 2013-11-07 ENCOUNTER — Encounter (HOSPITAL_COMMUNITY): Payer: Self-pay | Admitting: Radiology

## 2013-11-07 ENCOUNTER — Inpatient Hospital Stay (HOSPITAL_COMMUNITY): Payer: Self-pay

## 2013-11-07 DIAGNOSIS — E789 Disorder of lipoprotein metabolism, unspecified: Secondary | ICD-10-CM

## 2013-11-07 DIAGNOSIS — R1084 Generalized abdominal pain: Secondary | ICD-10-CM

## 2013-11-07 DIAGNOSIS — R05 Cough: Secondary | ICD-10-CM

## 2013-11-07 DIAGNOSIS — R079 Chest pain, unspecified: Secondary | ICD-10-CM

## 2013-11-07 LAB — GLUCOSE, CAPILLARY
Glucose-Capillary: 142 mg/dL — ABNORMAL HIGH (ref 70–99)
Glucose-Capillary: 169 mg/dL — ABNORMAL HIGH (ref 70–99)
Glucose-Capillary: 204 mg/dL — ABNORMAL HIGH (ref 70–99)
Glucose-Capillary: 240 mg/dL — ABNORMAL HIGH (ref 70–99)

## 2013-11-07 MED ORDER — INSULIN NPH (HUMAN) (ISOPHANE) 100 UNIT/ML ~~LOC~~ SUSP: 5.0000 [IU] | Freq: Two times a day (BID) | SUBCUTANEOUS | Status: DC

## 2013-11-07 MED ORDER — INSULIN NPH (HUMAN) (ISOPHANE) 100 UNIT/ML ~~LOC~~ SUSP
10.0000 [IU] | Freq: Every day | SUBCUTANEOUS | Status: DC
  Administered 2013-11-08: 10 [IU] via SUBCUTANEOUS
  Filled 2013-11-07 (×2): qty 10

## 2013-11-07 MED ORDER — IOHEXOL 300 MG/ML  SOLN
100.0000 mL | Freq: Once | INTRAMUSCULAR | Status: AC | PRN
  Administered 2013-11-07: 03:00:00 100 mL via INTRAVENOUS

## 2013-11-07 MED ORDER — INSULIN NPH (HUMAN) (ISOPHANE) 100 UNIT/ML ~~LOC~~ SUSP
5.0000 [IU] | Freq: Every day | SUBCUTANEOUS | Status: DC
  Administered 2013-11-07: 21:00:00 5 [IU] via SUBCUTANEOUS
  Filled 2013-11-07: qty 10

## 2013-11-07 NOTE — Progress Notes (Signed)
Nutrition Brief Note  Patient identified on the Malnutrition Screening Tool (MST) Report. Pt followed by RD at [facility] - strongly recommend continued close monitoring and follow-up with this RD for further education needs. Suspect wt changes 2/2 uncontrolled blood sugars.  Wt Readings from Last 15 Encounters:  11/06/13 131 lb 9.8 oz (59.7 kg)  11/04/13 125 lb 3.2 oz (56.79 kg)  03/21/13 142 lb 14.4 oz (64.819 kg)  09/04/12 143 lb 1.6 oz (64.91 kg)  01/04/12 140 lb 8 oz (63.73 kg)  08/28/11 154 lb 9.6 oz (70.126 kg)  11/15/10 146 lb 6.4 oz (66.407 kg)  09/15/10 141 lb 1 oz (63.986 kg)  08/25/10 143 lb 0.2 oz (64.87 kg)  10/12/09 163 lb 6.4 oz (74.118 kg)  05/04/09 157 lb 8 oz (71.442 kg)  03/02/09 151 lb 11.2 oz (68.811 kg)  12/24/08 137 lb 4.8 oz (62.279 kg)  09/10/08 147 lb 8 oz (66.906 kg)  07/09/08 142 lb 11.2 oz (64.728 kg)    Body mass index is 24.07 kg/(m^2). Patient meets criteria for Normal Weight based on current BMI.   Current diet order is Carbohydrate Modified Medium, patient is consuming approximately 95% of meals at this time. Labs and medications reviewed.   No nutrition interventions warranted at this time. If nutrition issues arise, please consult RD.   Jarold Motto MS, RD, LDN Pager: [phone] After-hours pager: [phone]

## 2013-11-07 NOTE — Plan of Care (Signed)
Problem: Phase I Progression Outcomes Goal: Pain controlled with appropriate interventions Outcome: Completed/Met Date Met:  11/07/13 Pt has had no complaints of pain since transferring to this unit.

## 2013-11-07 NOTE — Progress Notes (Signed)
Subjective: Lying in bed comfortably. Says he was able to eat more today. But appetite not completely back.  Objective: Vital signs in last 24 hours: Filed Vitals:   11/06/13 2247 11/07/13 0206 11/07/13 0600 11/07/13 1034  BP: 120/74 122/78 113/66 120/75  Pulse: 76 75 68 79  Temp: 99.2 F (37.3 C) 99.6 F (37.6 C) 98.3 F (36.8 C) 98.6 F (37 C)  TempSrc: Oral Oral Oral Oral  Resp: 18 18 18 18   Height:      Weight:      SpO2: 99% 99% 98% 98%   Weight change: 9 lb 4.2 oz (4.2 kg)  Intake/Output Summary (Last 24 hours) at 11/07/13 1232 Last data filed at 11/07/13 1035  Gross per 24 hour  Intake    840 ml  Output    820 ml  Net     20 ml   General appearance: alert, cooperative, appears stated age and no distress Lungs: clear to auscultation bilaterally Heart: regular rate and rhythm, S1, S2 normal, no murmur, click, rub or gallop Abdomen: soft, non-tender; bowel sounds normal; no masses,  no organomegaly Extremities: extremities normal, atraumatic, no cyanosis or edema  Lab Results: Basic Metabolic Panel:  Recent Labs Lab 11/05/13 2020 11/06/13 0415  NA 136 137  K 3.5 3.8  CL 104 107  CO2 25 23  GLUCOSE 101* 136*  BUN 7 5*  CREATININE 0.40* 0.36*  CALCIUM 8.0* 7.7*   Liver Function Tests:  Recent Labs Lab 11/04/13 1407 11/06/13 0430  AST 24 22  ALT 43 19  ALKPHOS 145* 72  BILITOT 0.3 0.5  PROT 9.1* 5.5*  ALBUMIN 4.3 2.8*    Recent Labs Lab 11/04/13 1407 11/06/13 0430  LIPASE 13 190*   CBC:  Recent Labs Lab 11/04/13 1407 11/04/13 1810  WBC 8.7 8.1  NEUTROABS 7.0  --   HGB 18.2* 16.8  HCT 51.5 47.3  MCV 88.0 88.7  PLT 343 298   CBG:  Recent Labs Lab 11/06/13 1622 11/06/13 2031 11/07/13 0001 11/07/13 0419 11/07/13 0753 11/07/13 1147  GLUCAP 223* 210* 169* 142* 114* 204*   Hemoglobin A1C:  Recent Labs Lab 11/04/13 1407  HGBA1C 11.6*   Alcohol Level: No results found for this basename: ETH,  in the last 168  hours Urinalysis:  Recent Labs Lab 11/06/13 1744  COLORURINE YELLOW  LABSPEC 1.018  PHURINE 7.5  GLUCOSEU >1000*  HGBUR NEGATIVE  BILIRUBINUR NEGATIVE  KETONESUR 15*  PROTEINUR NEGATIVE  UROBILINOGEN 1.0  NITRITE NEGATIVE  LEUKOCYTESUR NEGATIVE    Micro Results: Recent Results (from the past 240 hour(s))  MRSA PCR SCREENING     Status: None   Collection Time    11/04/13  4:30 PM      Result Value Range Status   MRSA by PCR NEGATIVE  NEGATIVE Final   Comment:            The GeneXpert MRSA Assay (FDA     approved for NASAL specimens     only), is one component of a     comprehensive MRSA colonization     surveillance program. It is not     intended to diagnose MRSA     infection nor to guide or     monitor treatment for     MRSA infections.   Studies/Results: Dg Chest 2 View  11/07/2013   CLINICAL DATA:  Cough.  EXAM: CHEST  2 VIEW  COMPARISON:  01/04/2012.  FINDINGS: The heart size and mediastinal contours  are within normal limits. Both lungs are clear. The visualized skeletal structures are unremarkable.  IMPRESSION: No active cardiopulmonary disease.   Electronically Signed   By: Maisie Fus  Register   On: 11/07/2013 07:10   Ct Abdomen Pelvis W Contrast  11/07/2013   CLINICAL DATA:  Diffuse abdominal pain. Elevated lipase. Weight loss.  EXAM: CT ABDOMEN AND PELVIS WITH CONTRAST  TECHNIQUE: Multidetector CT imaging of the abdomen and pelvis was performed using the standard protocol following bolus administration of intravenous contrast.  CONTRAST:  OMNIPAQUE IOHEXOL 300 MG/ML  SOLN  COMPARISON:  None.  FINDINGS: BODY WALL: Right inguinal hernia containing fat.  LOWER CHEST: Unremarkable.  ABDOMEN/PELVIS:  Liver: No focal abnormality.  Biliary: No evidence of biliary obstruction or stone.  Pancreas: Unremarkable.  Spleen: Unremarkable.  Adrenals: Unremarkable.  Kidneys and ureters: No hydronephrosis or stone.  Bladder: Circumferential bladder wall thickening, unexpected  for age.  Reproductive: Unremarkable.  Bowel: No bowel obstruction. Few colonic diverticula, clustered at the cecum and ascending colon. Normal appendix.  Retroperitoneum: No mass or adenopathy.  Peritoneum: No free fluid or gas.  Vascular: No acute abnormality.  OSSEOUS: Bilateral L5 pars defects without anterolisthesis or significant degenerative disc change. There are some mildly prominent vessels within the epidural space at L4 and L5 ventrally, of indeterminate significance. No evidence of abnormal intrathecal enhancement.  IMPRESSION: 1. No evidence of acute intra-abdominal disease. 2. Fat containing right inguinal hernia. 3. Mild, nonspecific bladder wall thickening. Correlate with urinalysis to exclude cystitis. 4. Few proximal colonic diverticula.   Electronically Signed   By: Tiburcio Pea M.D.   On: 11/07/2013 07:20   Medications: I have reviewed the patient's current medications. Scheduled Meds: . aspirin  81 mg Oral Daily  . enoxaparin (LOVENOX) injection  40 mg Subcutaneous Q24H  . FLUoxetine  20 mg Oral Daily  . insulin aspart  0-9 Units Subcutaneous Q4H  . insulin glargine  10 Units Subcutaneous QHS  . living well with diabetes book- in spanish   Does not apply Once  . potassium chloride  40 mEq Oral Daily  . simvastatin  5 mg Oral q1800   Continuous Infusions:  PRN Meds:.acetaminophen, dextrose, ondansetron Assessment/Plan: Principal Problem:   DKA (diabetic ketoacidoses) Active Problems:   DIABETES MELLITUS, TYPE II   ESSENTIAL HYPERTENSION   Diffuse abdominal pain  # DKA- Resolved. Anion Gap now within normal limits. Currently tolerating orally. Better appetite. Currently on Lantus 10 units at night, and SSI, requiring 1-3u only. Home meds on Insulin- NPH- 70/30, 40u- every morning, 35 units at night. Pt reports sometimes eating heavier meals at home. - CBGs- Q4H.  - K supplimentation at daily.  - Transfer to regular floor- tele, nurse reported pt had 4 beats of  V. Tach.  - Pending CBGs today will switch to NPH 5u at night, 10u in the morning.Possible discharge tomorrow.  # Diabetes- Hom meds- NPH- 70/30, 40u- every morning, 35 units at night. As per last clinic notes pt was supposed to be on metformin, but this is not on pt med list. Will cont when pt returns to baseline. Last HbA1c- 11.6. 11/04/2013. Reflects chronic poor control, but pt claims compliance with med. Will req adjustment on discharge or as an out-pt.   # HTN- Blood pressure stable- 120/75. Not on any anti-Htns meds at home.   # HLD- Last lipid profile- 03/28/2013 showed LDL- 121. Total- 204. As per last clinic visit in may, pt is supposed to be on Pravachol. But  this was not on pt med list.   # Chronic Cough and chest pain- Aetiolgy unclear at this time. 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 Pt also presented 11/15/2010 with c/o of fever, night sweats, chills and worsening dry cough- Had a chest xray which was normal. Last chest xray done-01/03/2013 showed no consolidation or effusion. Also WBC not elevated, on this admission 11/04/2013- 8.1.  - Chest xray-  No acute Cardio-pulm dx. - ESR -1,  WNL,  - Quantiferon assay- Pending.  - HIV test- negative.   # Abdominal Pain-  Acute, diffuse. No identifiable etiology.  - Lipase- Elevated 190. This was WNL on admission, will also check a lipid profile. Concern for some mild pancreatitis, as CT done yesterday, showed no abnormalities, except for bladder was thickening. Also not on meds that are known to cause pancreatitis, Last TG- 03/28/2013- 150. Also DKA can cause elevated Lipase. In the absence of other etiology to explain elevated lipase, DKA might be the cause. Will repeat prior to discharge.  - UA- Positive for ketones and glucose only. - LFTs-  11/06/2013- Alb- 2.8, protein- 5.5. Enzs- WNL.  - Lipase and lipid check tomorrow.  # DVT ppx-  Lovenox.  Dispo: Disposition is deferred at this time, awaiting improvement of current medical problems.  Anticipated discharge in approximately  day(s).   The patient does have a current PCP Christen Bame, MD) and does need an [facility] hospital follow-up appointment after discharge.  The patient does not know have transportation limitations that hinder transportation to clinic appointments.  .Services Needed at time of discharge: Y = Yes, Blank = No PT:   OT:   RN:   Equipment:   Other:     LOS: 3 days   Kennis Carina, MD 11/07/2013, 12:32 PM

## 2013-11-07 NOTE — Progress Notes (Signed)
Visit to patient while in hospital. Will call after patient is discharged to assist with transition of care. 

## 2013-11-07 NOTE — Progress Notes (Signed)
Internal Medicine Attending  Date: 11/07/2013  Patient name: Jason [name] Medical record number: [id number] Date of birth: [birth year] Age: 37 y.o. Gender: male  I saw and evaluated the patient a.m. rounds with house. I reviewed the resident's note by Dr. Mariea Clonts and I agree with the resident's findings and plans as documented in her note.

## 2013-11-08 LAB — LIPID PANEL
Cholesterol: 117 mg/dL (ref 0–200)
LDL Cholesterol: 66 mg/dL (ref 0–99)
Triglycerides: 86 mg/dL (ref <150)
VLDL: 17 mg/dL (ref 0–40)

## 2013-11-08 LAB — GLUCOSE, CAPILLARY
Glucose-Capillary: 156 mg/dL — ABNORMAL HIGH (ref 70–99)
Glucose-Capillary: 170 mg/dL — ABNORMAL HIGH (ref 70–99)

## 2013-11-08 LAB — LIPASE, BLOOD: Lipase: 244 U/L — ABNORMAL HIGH (ref 11–59)

## 2013-11-08 MED ORDER — INSULIN NPH ISOPHANE & REGULAR (70-30) 100 UNIT/ML ~~LOC~~ SUSP: 10.0000 [IU] | Freq: Two times a day (BID) | SUBCUTANEOUS | Status: DC

## 2013-11-08 NOTE — Discharge Summary (Signed)
Name: Jason [name] MRN: [id number] DOB: 1976-09-28 37 y.o. PCP: Christen Bame, MD  Date of Admission: 11/04/2013  4:09 PM Date of Discharge: 11/08/2013 Attending Physician: Farley Ly, MD  Discharge Diagnosis:  Principal Problem:   DKA (diabetic ketoacidoses) Active Problems:   DIABETES MELLITUS, TYPE II   ESSENTIAL HYPERTENSION   Diffuse abdominal pain  Discharge Medications:   Medication List         ACCU-CHEK SOFTCLIX LANCETS lancets  Use as instructed     albuterol 108 (90 BASE) MCG/ACT inhaler  Commonly known as:  PROVENTIL HFA  Inhale 2 puffs into the lungs every 6 (six) hours as needed for wheezing.     FLUoxetine 20 MG capsule  Commonly known as:  PROZAC  Take 1 capsule (20 mg total) by mouth daily.     glucose blood test strip  Commonly known as:  ACCU-CHEK AVIVA  Check blood glucose before breakfast and before bedtime for 7 days, then check once a day before breakfast     insulin NPH-regular (70-30) 100 UNIT/ML injection  Commonly known as:  NOVOLIN 70/30  Inject 10 Units into the skin 2 (two) times daily.       Insulin Syringes (Disposable) U-100 1 ML Misc  1 Device by Does not apply route 2 (two) times daily before a meal.        Disposition and follow-up:   Jason [name] was discharged from [facility] in Good condition.  At the hospital follow up visit please address:  1.  Patient should have gotten his glucometer by the time of his follow up visit, to aid in better Home insulin regimen adjustment, pt was discharged on a low dose of NPH as he wasn't finishing 100% of his meals and his requirement during admission was markedly reduced compared to previous home dose.   BMP  Check as potassium was supplimented during admission as pt was in DKA.   2. Lipase was elevated on discharge, likely due to DKA. Pls repeat to ensure levels are not persistently elevated or increasing.   Follow-up Appointments:       Follow-up Information   Follow up with Rudell Cobb On 11/25/2013. (10 am to fill out paperwork for orange card in internal medicine clinic)    Contact information:   Internal Medicine Clinic [phone]      Discharge Instructions: Discharge Orders   Future Appointments Provider Department Dept Phone   11/25/2013 10:00 AM Imp-Imcr Financial Counselor [facility] [phone]   Future Orders Complete By Expires   Diet - low sodium heart healthy  As directed    Discharge instructions  As directed    Comments:     Please pay attention to these instructions as we have made some changes to your insulin medication.   You will now be taking 10u of your NPH-Regular 70-30 insulin twice a day. We will like you to come see [place] in clinic as soon as possible and meet with Lupita Leash in our clinic downstairs, she is the slim lady who saw you yesterday. You need a glucometer and she will help you get one.   Hopefully we should be able to arrange for you to see her on Monday or Tuesday. We will contact you to let you know exactly when. Please if you have any questions about the dose of the medication you are going to be taking, ask [place] questions.  Again do not forget you will now be on  10u twice a day of Insulin.   Increase activity slowly  As directed      Procedures Performed:  Dg Chest 2 View  11/07/2013   CLINICAL DATA:  Cough.  EXAM: CHEST  2 VIEW  COMPARISON:  01/04/2012.  FINDINGS: The heart size and mediastinal contours are within normal limits. Both lungs are clear. The visualized skeletal structures are unremarkable.  IMPRESSION: No active cardiopulmonary disease.   Electronically Signed   By: Maisie Fus  Register   On: 11/07/2013 07:10   Ct Abdomen Pelvis W Contrast  11/07/2013   CLINICAL DATA:  Diffuse abdominal pain. Elevated lipase. Weight loss.  EXAM: CT ABDOMEN AND PELVIS WITH CONTRAST  TECHNIQUE: Multidetector CT imaging of the abdomen and pelvis was performed using the  standard protocol following bolus administration of intravenous contrast.  CONTRAST:  OMNIPAQUE IOHEXOL 300 MG/ML  SOLN  COMPARISON:  None.  FINDINGS: BODY WALL: Right inguinal hernia containing fat.  LOWER CHEST: Unremarkable.  ABDOMEN/PELVIS:  Liver: No focal abnormality.  Biliary: No evidence of biliary obstruction or stone.  Pancreas: Unremarkable.  Spleen: Unremarkable.  Adrenals: Unremarkable.  Kidneys and ureters: No hydronephrosis or stone.  Bladder: Circumferential bladder wall thickening, unexpected for age.  Reproductive: Unremarkable.  Bowel: No bowel obstruction. Few colonic diverticula, clustered at the cecum and ascending colon. Normal appendix.  Retroperitoneum: No mass or adenopathy.  Peritoneum: No free fluid or gas.  Vascular: No acute abnormality.  OSSEOUS: Bilateral L5 pars defects without anterolisthesis or significant degenerative disc change. There are some mildly prominent vessels within the epidural space at L4 and L5 ventrally, of indeterminate significance. No evidence of abnormal intrathecal enhancement.  IMPRESSION: 1. No evidence of acute intra-abdominal disease. 2. Fat containing right inguinal hernia. 3. Mild, nonspecific bladder wall thickening. Correlate with urinalysis to exclude cystitis. 4. Few proximal colonic diverticula.   Electronically Signed   By: Tiburcio Pea M.D.   On: 11/07/2013 07:20   Admission HPI:  History of Present Illness: 63 y o male with PMh of DM2, HTN, Rheumatoid artghritis, HLD.   Presnted today with c/o Vomiting fo rth epast 3 days, say he has been vomiting every 10-56mins, multiple times a day. Non bloody. Also complaints of diarrhea over the past 3 days, has about 2 episodes a day, non bloody. Pt reports that the severity has been the same since vomiting and diarrhea started 3 days ago. There is assoc abdominal pain, generalized also of 3 days duration. Pt also endorses fever and chills and poor appetite present for the past 2 days. There is  also a hx of polydypsia but no polyuria.  Patient eats home cooked meals, lives with his brother who eats the same meals with no similar symptoms, no hx of sick contacts, no recent use of antibiotics. Pt says he has not taken any insulin in the past 3 days because he has not been eating anything. Pt doesn't smoke cigarettes, takes alcohol occasionally, and denies use of illicit drugs.   Hospital Course by problem list: Principal Problem:   DKA (diabetic ketoacidoses) Active Problems:   DIABETES MELLITUS, TYPE II   ESSENTIAL HYPERTENSION   Diffuse abdominal pain   #  DKA- Patient presented to the clinic with nausea, vomiting, diarrhea and Abdominal pain. Viral gastroenteritis- was thought to be the most likely underlying cause, also Vomiting and abdominal could have being related to the DKA. Pt had not taken his insulin medication in 2 days  due to poor appetite. Home Insulin regimen- NPH-Regular- 70/30,  40u- every morning, 35 units at night. Blood sugars drawn in clinic >600, BMP revealed a bicarbonate of 14, and an anion gap of 34. Likley due to metabolic acidosis. Considering Anion gap there likley was an ongoing metabolic alkalosis due to vomiting and therefore loss of H ions. Pt ws therefore admitted to the hospital from the clinic.  Pt has no hx of CKD, but BUN on admission was- elevated- 35, Cr- WNl- 1.09, but Cr was twice pts baseline of 0.5-0.6. Calculated BUN-Cr ratio was >20, so likely due to Pre-renal Failure, from hypovolemia- from hyperglycemia, vomiting and diarrhea. CBC- WBC- 8.7- No concern for infection, Hb- elev at 18.2, likely due to hemoconcentration. Lipase- Normal- 13. Pt was admitted to step down, was made NPO, Blood ketones- beta hydroxybutyric acid was elevated at 10.14. DKA protocol was instituted with insulin- 1 unit/ml in N/s infusion, and ppt was aggressive hydrated with IVFs. Odansetron 4mg  Q4H was also given. BMP was checked every 2 hours. Potassium was also monitored and  supplimented. When blood sugars dropped below 250, IVF was changed form N/s to D5 1/2 Ns, . With aggressive management BMP the next day revealed  an Anion gap- 7. With initiation of oral intake, lantus was started, 10u at night with SSI, and later switched to NPH insulin- 10u in the morning and 5u at night.  Also with hydration, BMP showed improved renal function- 11/06/2013- Cr- 0.36, BUN- 5. Pt continued to report abdominal pain with poor appetite, repeat lipase- elevated at 190, compared to 13 on admission, which could be attributed to DKA. But abdominal Ct was done to rule out Pancreatitis, which was negative for any explanatory intra-abdominal process. Repeat lipase on discharge-11/08/2013- was more elevated at 244. Lipid profile- TG- 86. Patient also complained of chronic productive cough and chest pain, and has a hx of Tb infection- as per chat review, Quantiferon was checked 11/06/2013  was negative and ESR was WNL- 1, and a chest xray 11/06/2013 was negative for active cardiopulmonary process. Patient gradually showed improvement in appetite and reported significant reduction in abdominal pain, and was therefore discharged to continue with his NPH- regular insulin but at a markedly reduced dose of 10u BID as his insulin requirement during admission was reduced compared to previous home dose. Appointment was made to see our DM educator as soon as possible, and close clinic follow up visit was subsequently arranged.    # HTN- Blood pressure stable and WNL throughout admission- 115/71 on discharge. Not on any antiHtns meds at home.  # HLD- Lipid profile- 03/28/2013 showed LDL- 121. Total- 204. As per last clinic visit in may, patient was supposed to be on Pravachol. But this was not on pt med list. More recent lipid profile during this admission- 11/08/2013- LDL- 66, HDL- 34, total chol- 117, TG- 86.  Discharge Vitals:   BP 115/71  Pulse 64  Temp(Src) 99 F (37.2 C) (Oral)  Resp 20  Ht 5\' 2"   (1.575 m)  Wt 131 lb 9.8 oz (59.7 kg)  BMI 24.07 kg/m2  SpO2 99%  Discharge Labs:  Results for orders placed during the hospital encounter of 11/04/13 (from the past 24 hour(s))  GLUCOSE, CAPILLARY     Status: Abnormal   Collection Time    11/07/13  4:14 PM      Result Value Range   Glucose-Capillary 254 (*) 70 - 99 mg/dL  GLUCOSE, CAPILLARY     Status: Abnormal   Collection Time    11/07/13  8:58  PM      Result Value Range   Glucose-Capillary 240 (*) 70 - 99 mg/dL  GLUCOSE, CAPILLARY     Status: Abnormal   Collection Time    11/08/13 12:01 AM      Result Value Range   Glucose-Capillary 156 (*) 70 - 99 mg/dL  GLUCOSE, CAPILLARY     Status: Abnormal   Collection Time    11/08/13  4:23 AM      Result Value Range   Glucose-Capillary 170 (*) 70 - 99 mg/dL  LIPID PANEL     Status: Abnormal   Collection Time    11/08/13  6:55 AM      Result Value Range   Cholesterol 117  0 - 200 mg/dL   Triglycerides 86  <409 mg/dL   HDL 34 (*) >81 mg/dL   Total CHOL/HDL Ratio 3.4     VLDL 17  0 - 40 mg/dL   LDL Cholesterol 66  0 - 99 mg/dL  LIPASE, BLOOD     Status: Abnormal   Collection Time    11/08/13  6:55 AM      Result Value Range   Lipase 244 (*) 11 - 59 U/L  GLUCOSE, CAPILLARY     Status: Abnormal   Collection Time    11/08/13  7:46 AM      Result Value Range   Glucose-Capillary 130 (*) 70 - 99 mg/dL   Comment 1 Documented in Chart     Comment 2 Notify RN      Signed: Kennis Carina, MD 11/08/2013, 12:30 PM   Time Spent on Discharge: 35 minutes Services Ordered on Discharge: None Equipment Ordered on Discharge: None

## 2013-11-08 NOTE — Progress Notes (Signed)
Jason [name] to be D/C'd Home per MD order.  Discussed with the patient and all questions fully answered.    Medication List         ACCU-CHEK SOFTCLIX LANCETS lancets  Use as instructed     albuterol 108 (90 BASE) MCG/ACT inhaler  Commonly known as:  PROVENTIL HFA  Inhale 2 puffs into the lungs every 6 (six) hours as needed for wheezing.     FLUoxetine 20 MG capsule  Commonly known as:  PROZAC  Take 1 capsule (20 mg total) by mouth daily.     glucose blood test strip  Commonly known as:  ACCU-CHEK AVIVA  Check blood glucose before breakfast and before bedtime for 7 days, then check once a day before breakfast     insulin NPH-regular (70-30) 100 UNIT/ML injection  Commonly known as:  NOVOLIN 70/30  Inject 10 Units into the skin 2 (two) times daily.     Insulin Syringes (Disposable) U-100 1 ML Misc  1 Device by Does not apply route 2 (two) times daily before a meal.        VVS, Skin clean, dry and intact without evidence of skin break down, no evidence of skin tears noted. IV catheter discontinued intact. Site without signs and symptoms of complications. Dressing and pressure applied.  An After Visit Summary was printed and given to the patient.  D/c education completed with patient/family including follow up instructions, medication list, d/c activities limitations if indicated, with other d/c instructions as indicated by MD - patient able to verbalize understanding, all questions fully answered.   Patient instructed to return to ED, call 911, or call MD for any changes in condition.   Patient escorted via WC, and D/C home via private auto.  Aldean Ast 11/08/2013 2:51 PM

## 2013-11-08 NOTE — Progress Notes (Signed)
Subjective: Ready to go home. No complaints today. Say appetite is better. Ate more of his food today. Abdominal pain resolving.   Objective: Vital signs in last 24 hours: Filed Vitals:   11/07/13 1400 11/07/13 2059 11/08/13 0015 11/08/13 0425  BP: 110/72 114/75 112/75 115/71  Pulse: 74 84 74 64  Temp: 98 F (36.7 C) 98.6 F (37 C) 99 F (37.2 C) 99 F (37.2 C)  TempSrc: Oral Oral Oral Oral  Resp: 18 17 19 20   Height:      Weight:      SpO2: 98% 100% 98% 99%   Weight change:   Intake/Output Summary (Last 24 hours) at 11/08/13 1102 Last data filed at 11/08/13 0933  Gross per 24 hour  Intake    660 ml  Output      0 ml  Net    660 ml   Physical exam-   General appearance: alert, cooperative, appears as stated age and in no distress Lungs: No wheezes or crackles.  Heart: regular rate and rhythm, S1, S2 normal, no murmur, click, rub or gallop Abdomen: soft, says he has some tenderness on palpation of his abdomen, bowel sounds normal; no masses, no organomegaly. Extremities: extremities normal, atraumatic, no cyanosis or edema.  Lab Results: Basic Metabolic Panel:  Recent Labs Lab 11/05/13 2020 11/06/13 0415  NA 136 137  K 3.5 3.8  CL 104 107  CO2 25 23  GLUCOSE 101* 136*  BUN 7 5*  CREATININE 0.40* 0.36*  CALCIUM 8.0* 7.7*   Liver Function Tests:  Recent Labs Lab 11/04/13 1407 11/06/13 0430  AST 24 22  ALT 43 19  ALKPHOS 145* 72  BILITOT 0.3 0.5  PROT 9.1* 5.5*  ALBUMIN 4.3 2.8*    Recent Labs Lab 11/06/13 0430 11/08/13 0655  LIPASE 190* 244*   CBC:  Recent Labs Lab 11/04/13 1407 11/04/13 1810  WBC 8.7 8.1  NEUTROABS 7.0  --   HGB 18.2* 16.8  HCT 51.5 47.3  MCV 88.0 88.7  PLT 343 298   CBG:  Recent Labs Lab 11/07/13 1147 11/07/13 1614 11/07/13 2058 11/08/13 0001 11/08/13 0423 11/08/13 0746  GLUCAP 204* 254* 240* 156* 170* 130*   Hemoglobin A1C:  Recent Labs Lab 11/04/13 1407  HGBA1C 11.6*   Alcohol Level: No  results found for this basename: ETH,  in the last 168 hours Urinalysis:  Recent Labs Lab 11/06/13 1744  COLORURINE YELLOW  LABSPEC 1.018  PHURINE 7.5  GLUCOSEU >1000*  HGBUR NEGATIVE  BILIRUBINUR NEGATIVE  KETONESUR 15*  PROTEINUR NEGATIVE  UROBILINOGEN 1.0  NITRITE NEGATIVE  LEUKOCYTESUR NEGATIVE    Micro Results: Recent Results (from the past 240 hour(s))  MRSA PCR SCREENING     Status: None   Collection Time    11/04/13  4:30 PM      Result Value Range Status   MRSA by PCR NEGATIVE  NEGATIVE Final   Comment:            The GeneXpert MRSA Assay (FDA     approved for NASAL specimens     only), is one component of a     comprehensive MRSA colonization     surveillance program. It is not     intended to diagnose MRSA     infection nor to guide or     monitor treatment for     MRSA infections.   Studies/Results: Dg Chest 2 View  11/07/2013   CLINICAL DATA:  Cough.  EXAM: CHEST  2 VIEW  COMPARISON:  01/04/2012.  FINDINGS: The heart size and mediastinal contours are within normal limits. Both lungs are clear. The visualized skeletal structures are unremarkable.  IMPRESSION: No active cardiopulmonary disease.   Electronically Signed   By: Maisie Fus  Register   On: 11/07/2013 07:10   Ct Abdomen Pelvis W Contrast  11/07/2013   CLINICAL DATA:  Diffuse abdominal pain. Elevated lipase. Weight loss.  EXAM: CT ABDOMEN AND PELVIS WITH CONTRAST  TECHNIQUE: Multidetector CT imaging of the abdomen and pelvis was performed using the standard protocol following bolus administration of intravenous contrast.  CONTRAST:  OMNIPAQUE IOHEXOL 300 MG/ML  SOLN  COMPARISON:  None.  FINDINGS: BODY WALL: Right inguinal hernia containing fat.  LOWER CHEST: Unremarkable.  ABDOMEN/PELVIS:  Liver: No focal abnormality.  Biliary: No evidence of biliary obstruction or stone.  Pancreas: Unremarkable.  Spleen: Unremarkable.  Adrenals: Unremarkable.  Kidneys and ureters: No hydronephrosis or stone.   Bladder: Circumferential bladder wall thickening, unexpected for age.  Reproductive: Unremarkable.  Bowel: No bowel obstruction. Few colonic diverticula, clustered at the cecum and ascending colon. Normal appendix.  Retroperitoneum: No mass or adenopathy.  Peritoneum: No free fluid or gas.  Vascular: No acute abnormality.  OSSEOUS: Bilateral L5 pars defects without anterolisthesis or significant degenerative disc change. There are some mildly prominent vessels within the epidural space at L4 and L5 ventrally, of indeterminate significance. No evidence of abnormal intrathecal enhancement.  IMPRESSION: 1. No evidence of acute intra-abdominal disease. 2. Fat containing right inguinal hernia. 3. Mild, nonspecific bladder wall thickening. Correlate with urinalysis to exclude cystitis. 4. Few proximal colonic diverticula.   Electronically Signed   By: Tiburcio Pea M.D.   On: 11/07/2013 07:20   Medications: I have reviewed the patient's current medications. Scheduled Meds: . aspirin  81 mg Oral Daily  . enoxaparin (LOVENOX) injection  40 mg Subcutaneous Q24H  . FLUoxetine  20 mg Oral Daily  . insulin aspart  0-9 Units Subcutaneous Q4H  . insulin NPH  10 Units Subcutaneous QAC breakfast  . insulin NPH  5 Units Subcutaneous QHS  . living well with diabetes book- in spanish   Does not apply Once  . potassium chloride  40 mEq Oral Daily  . simvastatin  5 mg Oral q1800   Continuous Infusions:  PRN Meds:.acetaminophen, dextrose, ondansetron Assessment/Plan: Principal Problem:   DKA (diabetic ketoacidoses) Active Problems:   DIABETES MELLITUS, TYPE II   ESSENTIAL HYPERTENSION   Diffuse abdominal pain  # DKA- Resolved. Anion Gap now within normal limits. Currently tolerating orally. Better appetite.  Home meds on Insulin- NPH- 70/30, 40u- every morning, 35 units at night. But started pt back on home NPH at 5u at night and 10u during the day. As pt has not been requiring a lot of insulin.  Over night pt  req 4u total of SSI.  Pt reports sometimes eating heavier meals at home. - CBGs- Q4H.  - K supplimentation at daily.  - Transfer to regular floor- tele, nurse reported pt had 4 beats of V. Tach.  - For discharge today., on 10u BID of NPH/regular.  # Diabetes- Previous Hom meds- NPH- -Regular 70/30, 40u- every morning, 35 units at night. As per last clinic notes pt was supposed to be on metformin, but this is not on pt med list. Will cont when pt returns to baseline. Last HbA1c- 11.6. 11/04/2013. Reflects chronic poor control, but pt claims compliance with med. Will req adjustment on discharge or as an out-pt.  Pt has no glucometer, previously had one, knows how to use it. Will discharge home on 10u at night and 10u during the day.   # HTN- Blood pressure stable- 115/71. Not on any anti-Htns meds at home.   # HLD- Last lipid profile- 03/28/2013 showed LDL- 121. Total- 204. As per last clinic visit in may, pt is supposed to be on Pravachol. But this was not on pt med list. Lipid profile check today- LDL- 66, HDL- 34, Total- 117. Will not continue on discharge.  # Chronic Cough and chest pain with weightloss- Aetiolgy unclear at this time. 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 Pt also presented 11/15/2010 with c/o of fever, night sweats, chills and worsening dry cough. Had a chest xray which was normal. Last chest xray done-01/03/2013 showed no consolidation or effusion. Also WBC not elevated, on this admission 11/04/2013- 8.1.  - Chest xray-  No acute Cardio-pulm dx. - ESR -1,  WNL,  - Quantiferon assay- Pending, follow up as out-pt.  - HIV test- negative.  - Follow up as out-pt.  # Abdominal Pain-  Acute, diffuse. No identifiable etiology.  - Lipase- Elevated 190. This was WNL on admission, will also check a lipid profile. Concern for some mild pancreatitis, but CT done , showed no abnormalities,  except for bladder wall thickening. Also not on meds that are known to cause pancreatitis, Last TG- 03/28/2013- 150. Also DKA can cause elevated Lipase. In the absence of other etiology to explain elevated lipase, DKA might be the cause. Will repeat prior to discharge.  - UA- Positive for ketones and glucose only. - LFTs-  11/06/2013- Alb- 2.8, protein- 5.5. Enzs- WNL.  - Lipase- elevated -244,  and lipid check- Triglyc 86. Pt has clinically improved, abdominal pain appears to be resolving and tolerating orally without diarrhea or vomiting.   # DVT ppx- Lovenox.  Dispo: Disposition is deferred at this time, awaiting improvement of current medical problems.  Anticipated discharge in approximately  day(s).   The patient does have a current PCP Christen Bame, MD) and does need an [facility] hospital follow-up appointment after discharge.  The patient does not know have transportation limitations that hinder transportation to clinic appointments.  .Services Needed at time of discharge: Y = Yes, Blank = No PT:   OT:   RN:   Equipment:   Other:     LOS: 4 days   Kennis Carina, MD 11/08/2013, 11:02 AM

## 2013-11-10 ENCOUNTER — Telehealth: Payer: Self-pay | Admitting: Dietician

## 2013-11-10 LAB — QUANTIFERON TB GOLD ASSAY (BLOOD)
Interferon Gamma Release Assay: NEGATIVE
Mitogen value: >10 IU/mL
Quantiferon Nil Value: 0.04 IU/mL
TB Ag value: 0.04 IU/mL
TB Antigen Minus Nil Value: 0 IU/mL

## 2013-11-10 NOTE — Telephone Encounter (Signed)
Discharge date:11-08-13 Call date: 11-10-13 Hospital follow up appointment date: 11-25-12 at 10 AM with Financial Counselor   Calling to assist with transition of care from hospital to home.  Discharge medications reviewed: has not picked up his medicine at [facility] yet. He plans to go today.  Able to fill all prescriptions? Has insulin, has a meter, the numbers he is getting on his meter are 140-200-225, he feels so-so.  Patient aware of hospital follow up appointments. Yes- with the financial counselor, but not a doctor Doe he have problems with transportation to his appintment? He denies Other problems/concerns: He denied   Told patient I will look into a hospital follow up appointment. Someone should be calling him about an appointment

## 2013-11-11 NOTE — Telephone Encounter (Signed)
Spoke with front office staff. They will schedule a hospital follow up appointment.

## 2013-11-25 ENCOUNTER — Ambulatory Visit: Payer: Self-pay | Admitting: Internal Medicine

## 2013-11-25 ENCOUNTER — Encounter: Payer: Self-pay | Admitting: Internal Medicine

## 2013-11-25 ENCOUNTER — Ambulatory Visit: Payer: Self-pay | Admitting: Dietician

## 2013-11-25 ENCOUNTER — Ambulatory Visit: Payer: Self-pay

## 2014-05-07 IMAGING — CT CT ABD-PELV W/ CM
2 of 4 series · 15 of 46 positions shown, 17 images · IV contrast (APPLIED)
Comparison: None.

CLINICAL DATA: Diffuse abdominal pain. Elevated lipase. Weight
loss.

EXAM:
CT ABDOMEN AND PELVIS WITH CONTRAST
TECHNIQUE: Multidetector CT imaging of the abdomen and pelvis was performed
using the standard protocol following bolus administration of
intravenous contrast.
CONTRAST:  100mL OMNIPAQUE IOHEXOL 300 MG/ML  SOLN

[Series 2: abd/ pelvis 5.0 i30f 1 · axial · 0.59mm/px · z∈[-432,+2]mm · 12 of 99 slices shown, 14 images]
[im 8/99  soft-tissue]
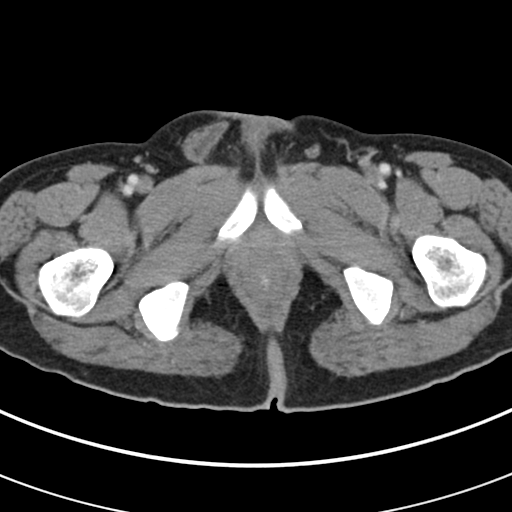
[im 8/99  bone]
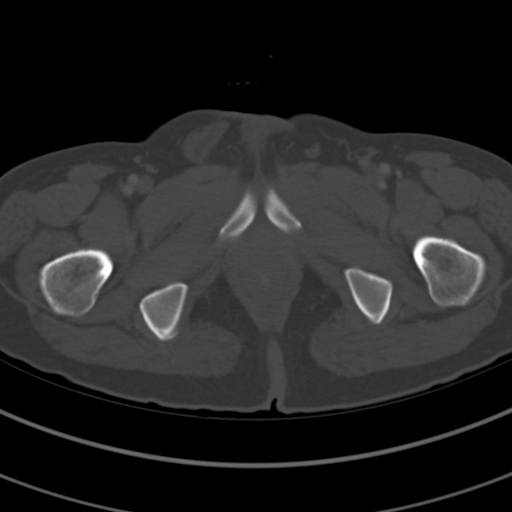
[im 16/99  soft-tissue]
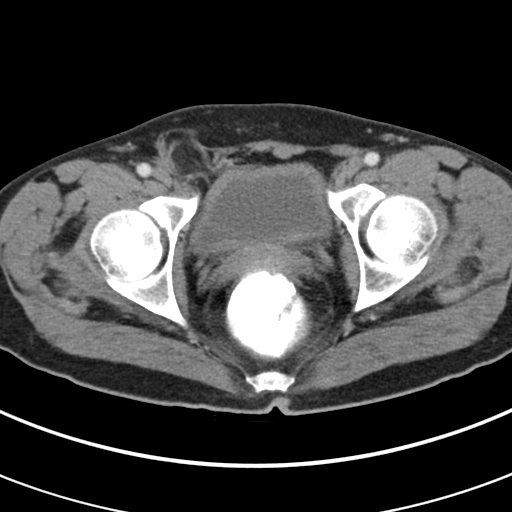
[im 24/99  soft-tissue]
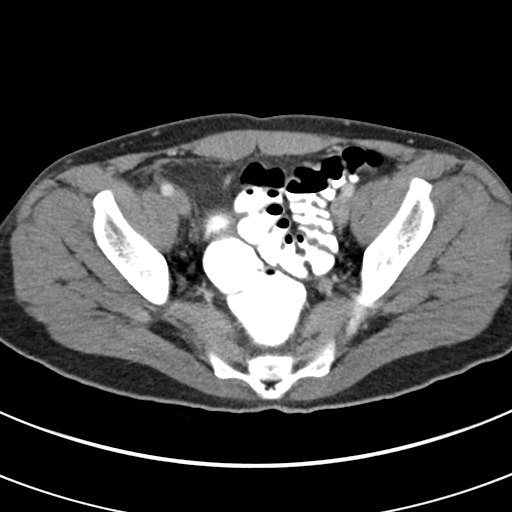
[im 32/99  soft-tissue]
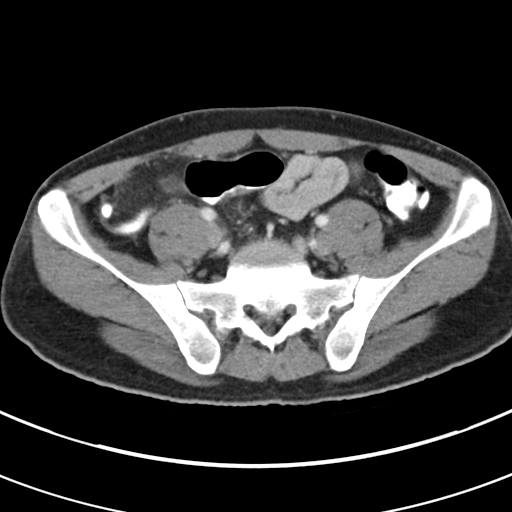
[im 40/99  soft-tissue]
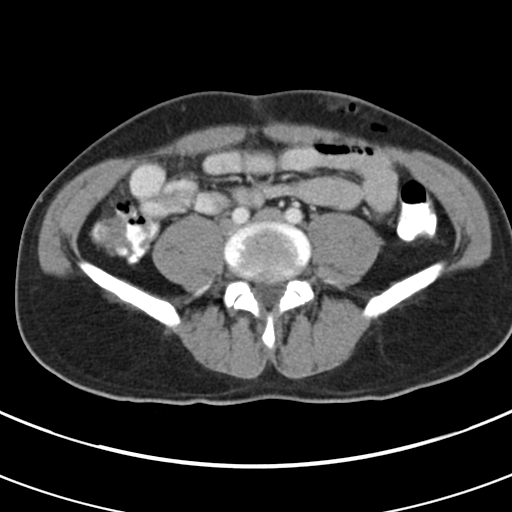
[im 48/99  soft-tissue]
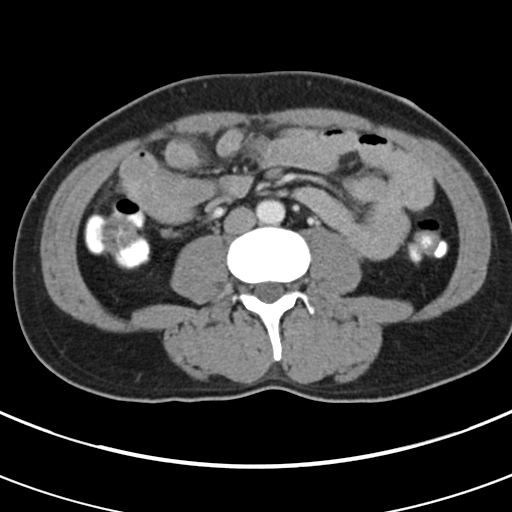
[im 55/99  soft-tissue]
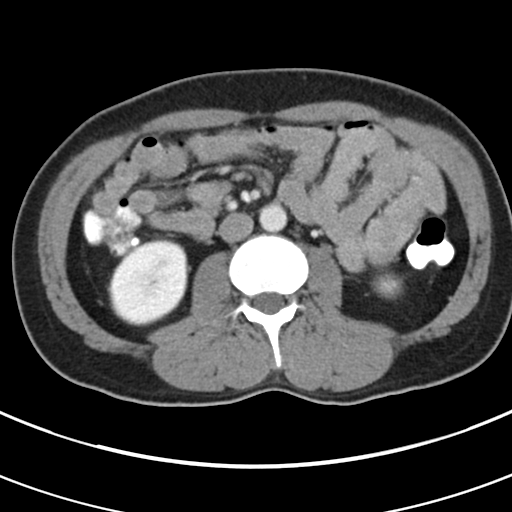
[im 63/99  soft-tissue]
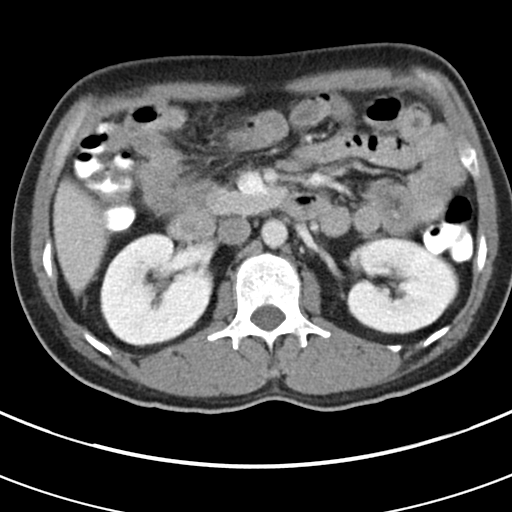
[im 71/99  soft-tissue]
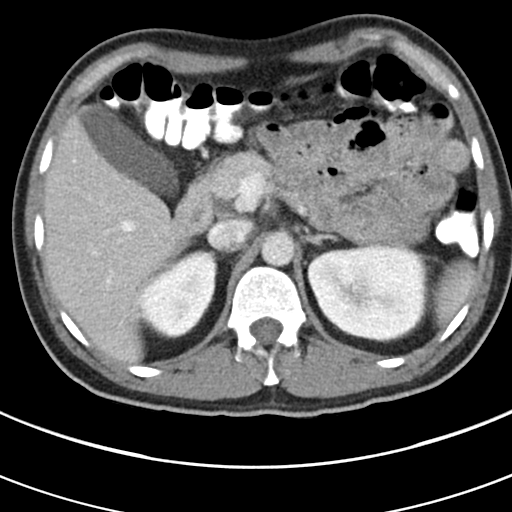
[im 71/99  bone]
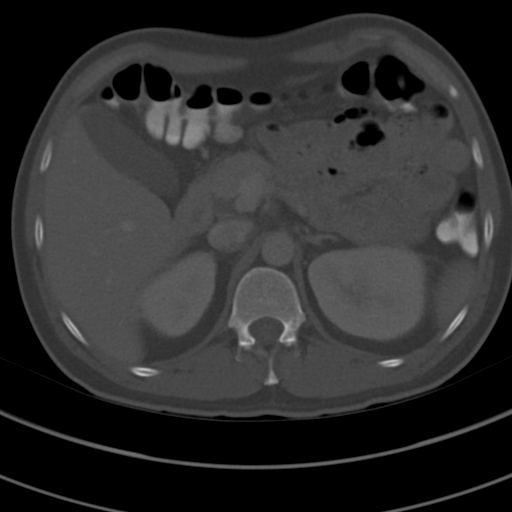
[im 79/99  soft-tissue]
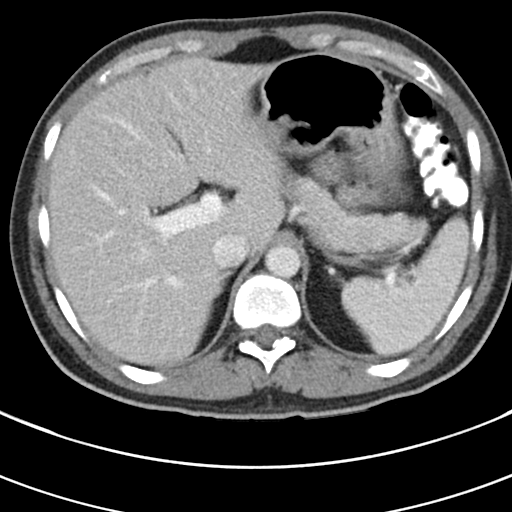
[im 87/99  soft-tissue]
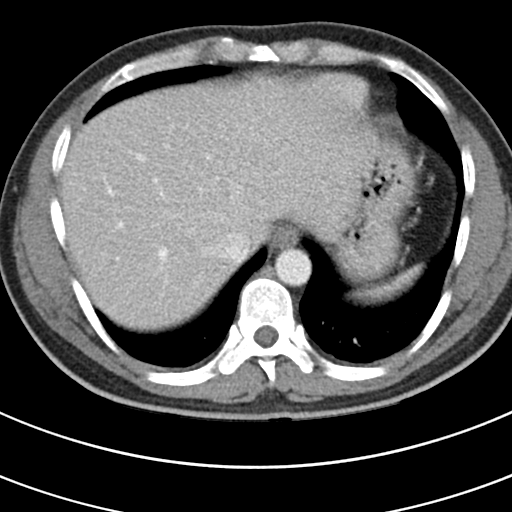
[im 95/99  soft-tissue]
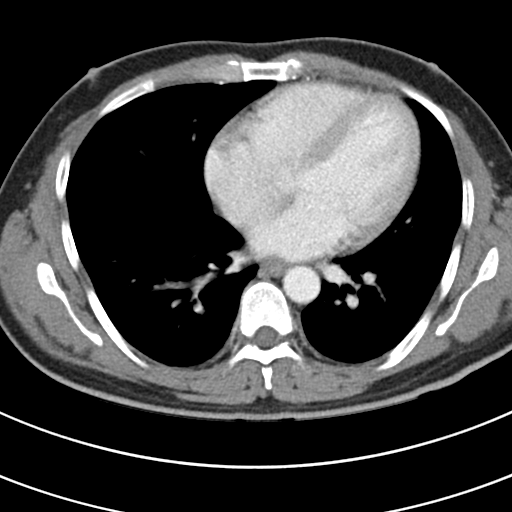

[Series 5: cor · coronal · 0.65mm/px · 3 of 111 slices shown]
[im 37/111  soft-tissue]
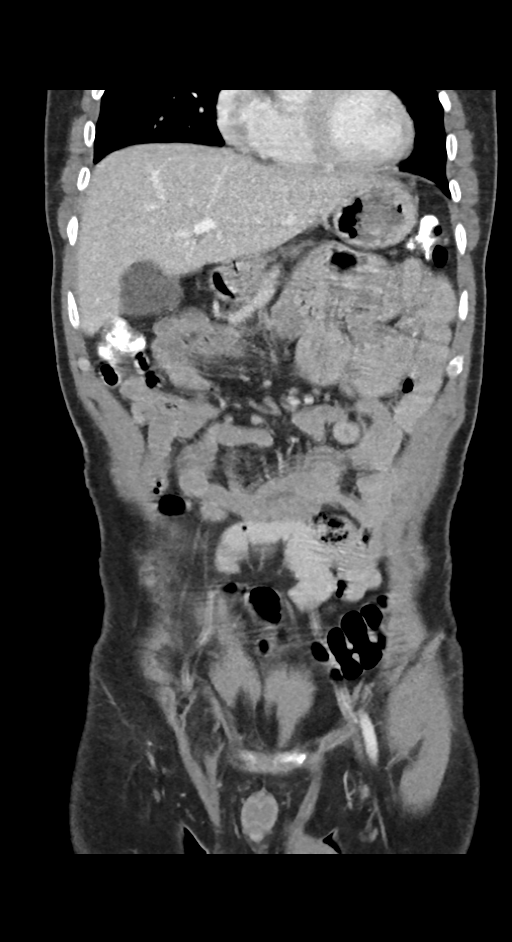
[im 49/111  soft-tissue]
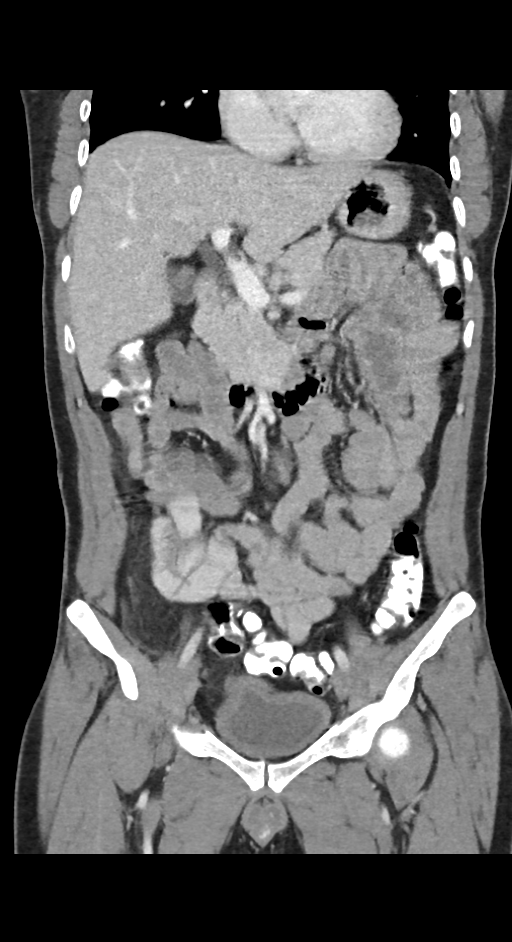
[im 62/111  soft-tissue]
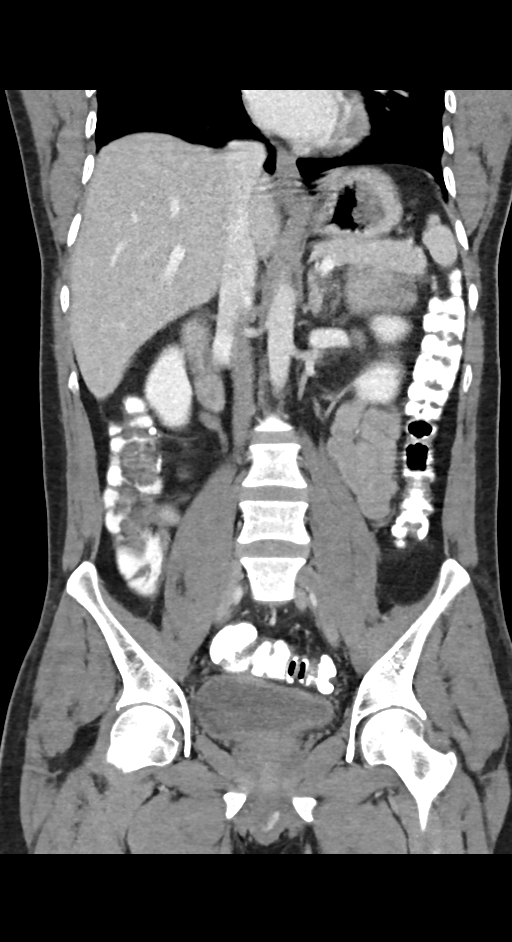

[15 of 46 positions shown; findings below may reference images not displayed]

FINDINGS: BODY WALL: Right inguinal hernia containing fat.

LOWER CHEST: Unremarkable.

ABDOMEN/PELVIS:

Liver: No focal abnormality.

Biliary: No evidence of biliary obstruction or stone.

Pancreas: Unremarkable.

Spleen: Unremarkable.

Adrenals: Unremarkable.

Kidneys and ureters: No hydronephrosis or stone.

Bladder: Circumferential bladder wall thickening, unexpected for
age.

Reproductive: Unremarkable.

Bowel: No bowel obstruction. Few colonic diverticula, clustered at
the cecum and ascending colon. Normal appendix.

Retroperitoneum: No mass or adenopathy.

Peritoneum: No free fluid or gas.

Vascular: No acute abnormality.

OSSEOUS: Bilateral L5 pars defects without anterolisthesis or
significant degenerative disc change. There are some mildly
prominent vessels within the epidural space at L4 and L5 ventrally,
of indeterminate significance. No evidence of abnormal intrathecal
enhancement.
IMPRESSION: 1. No evidence of acute intra-abdominal disease.
2. Fat containing right inguinal hernia.
3. Mild, nonspecific bladder wall thickening. Correlate with
urinalysis to exclude cystitis.
4. Few proximal colonic diverticula.

## 2014-05-07 IMAGING — CR DG CHEST 2V
2 series · 2 of 2 positions shown · non-contrast
Comparison: 01/04/2012.

CLINICAL DATA: Cough.

EXAM:
CHEST  2 VIEW

[w chest pa]
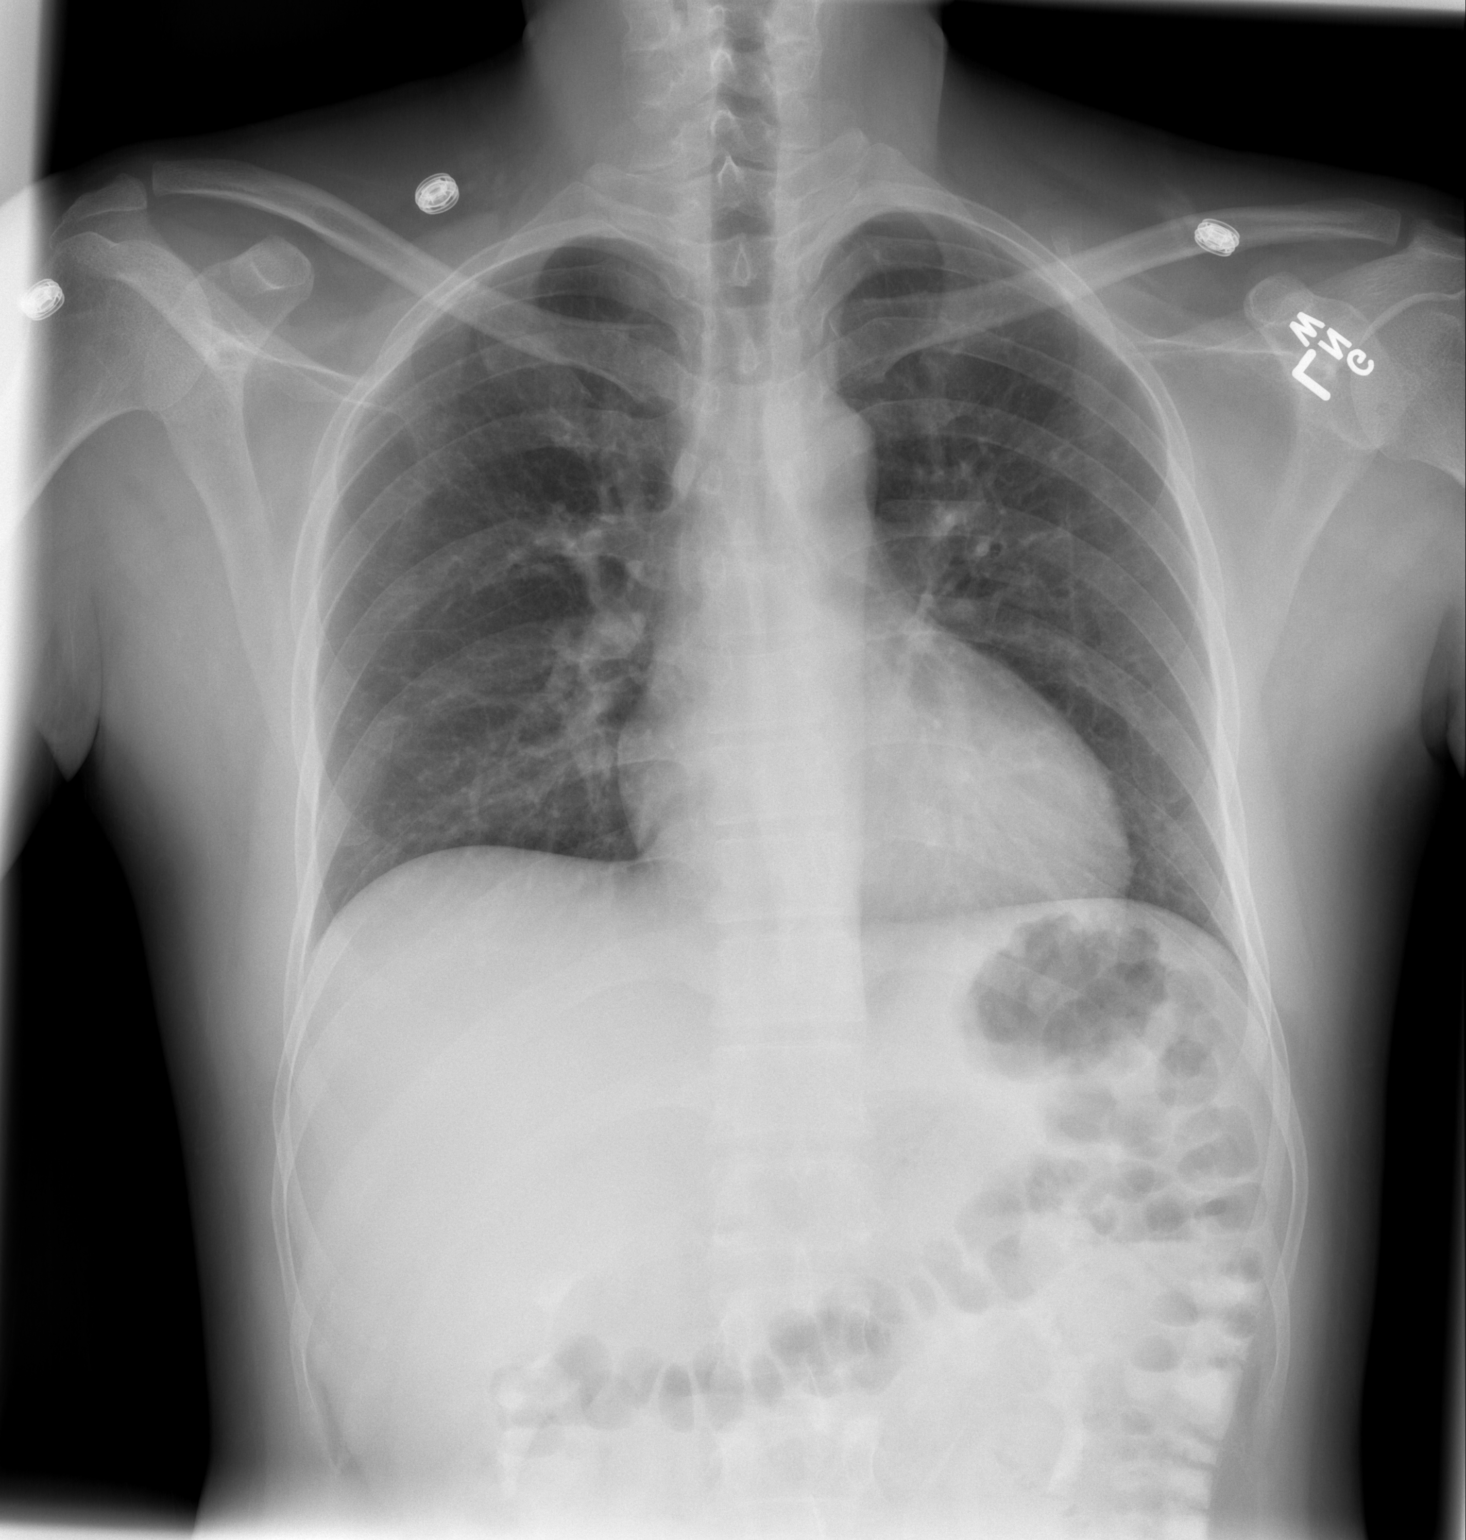

[w chest lat]
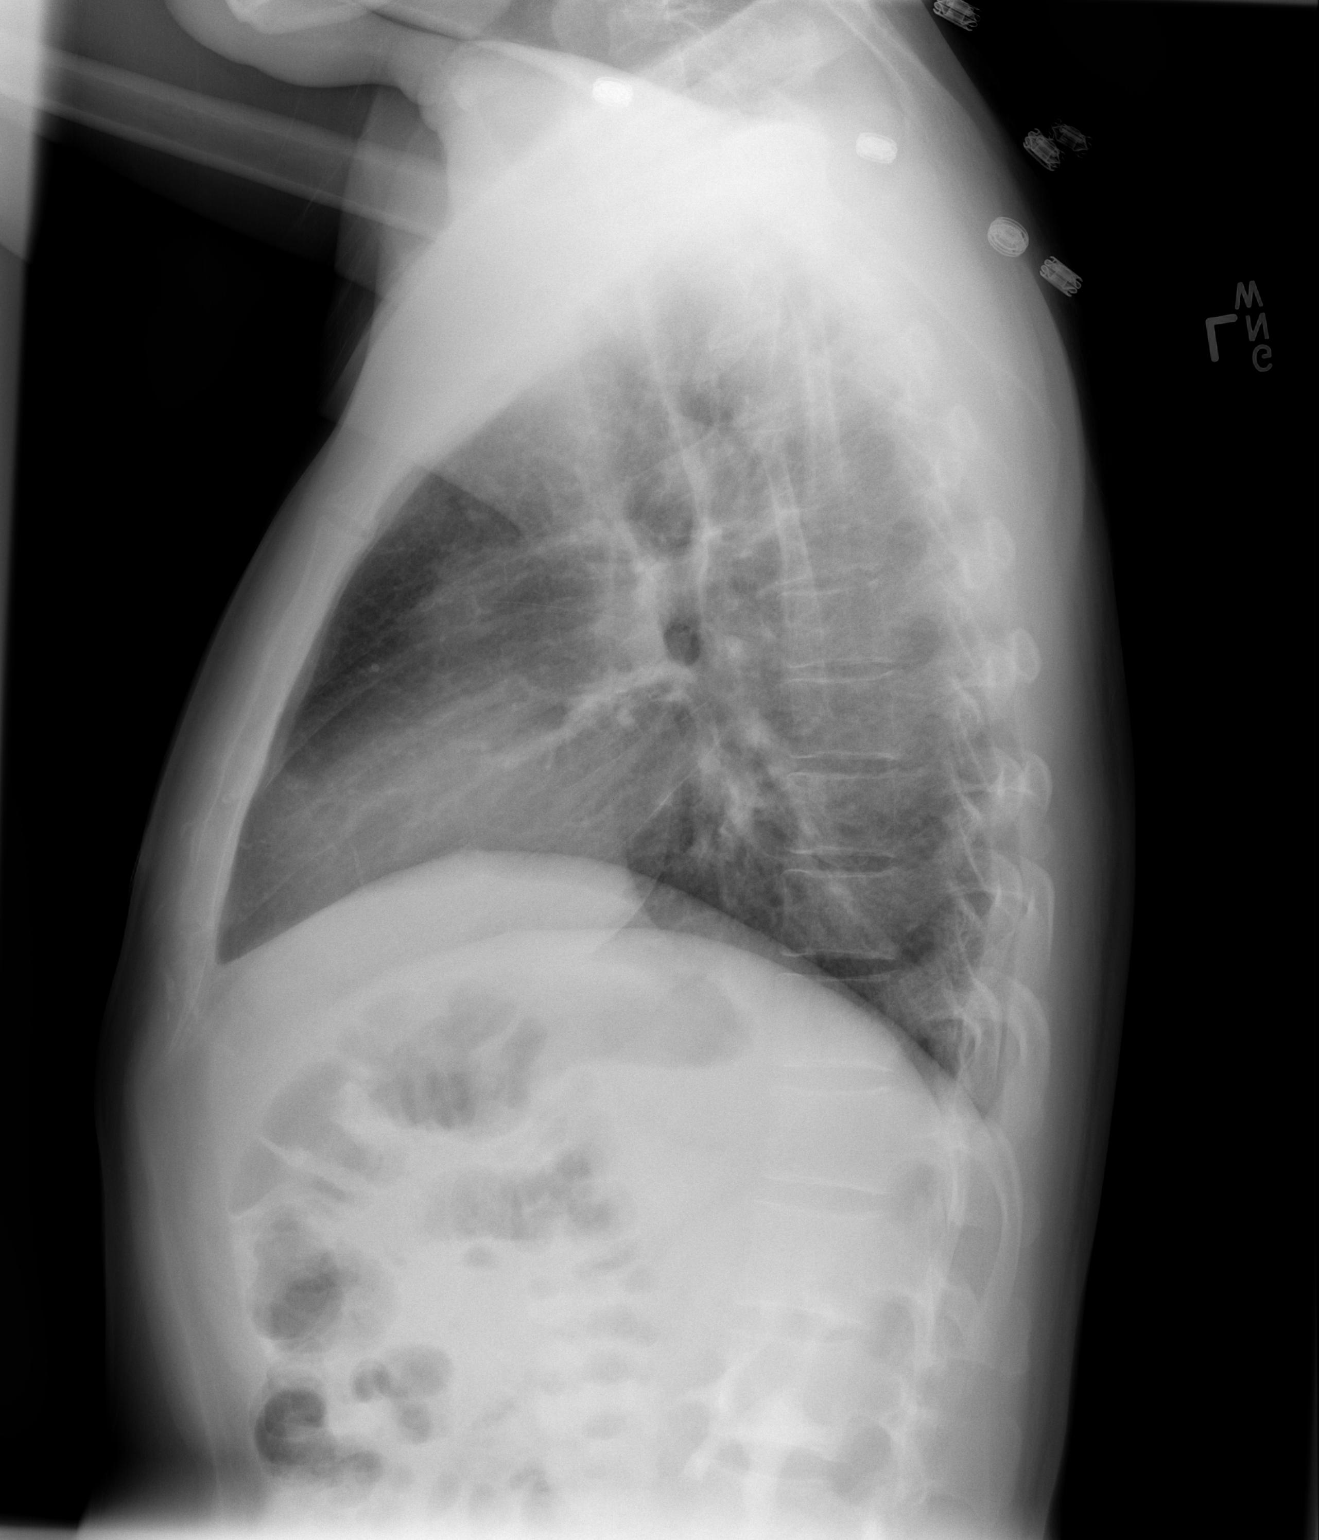

[2 of 2 positions shown; findings below may reference images not displayed]

FINDINGS: The heart size and mediastinal contours are within normal limits.
Both lungs are clear. The visualized skeletal structures are
unremarkable.
IMPRESSION: No active cardiopulmonary disease.

## 2014-07-15 ENCOUNTER — Ambulatory Visit: Payer: Self-pay | Admitting: Internal Medicine

## 2014-07-15 ENCOUNTER — Encounter: Payer: Self-pay | Admitting: Internal Medicine

## 2014-08-28 ENCOUNTER — Encounter: Payer: Self-pay | Admitting: *Deleted

## 2015-02-25 ENCOUNTER — Telehealth: Payer: Self-pay | Admitting: Internal Medicine

## 2015-02-25 NOTE — Telephone Encounter (Signed)
Call to patient to confirm appointment for 02/26/15 at 2:15 lmtcb

## 2015-02-26 ENCOUNTER — Ambulatory Visit (INDEPENDENT_AMBULATORY_CARE_PROVIDER_SITE_OTHER): Payer: Self-pay | Admitting: Internal Medicine

## 2015-02-26 VITALS — BP 141/81 | HR 80 | Temp 98.4°F | Ht 63.0 in | Wt 135.1 lb

## 2015-02-26 DIAGNOSIS — K4091 Unilateral inguinal hernia, without obstruction or gangrene, recurrent: Secondary | ICD-10-CM

## 2015-02-26 DIAGNOSIS — I10 Essential (primary) hypertension: Secondary | ICD-10-CM

## 2015-02-26 DIAGNOSIS — IMO0002 Reserved for concepts with insufficient information to code with codable children: Secondary | ICD-10-CM

## 2015-02-26 DIAGNOSIS — M0600 Rheumatoid arthritis without rheumatoid factor, unspecified site: Secondary | ICD-10-CM

## 2015-02-26 DIAGNOSIS — Z794 Long term (current) use of insulin: Secondary | ICD-10-CM

## 2015-02-26 DIAGNOSIS — E10339 Type 1 diabetes mellitus with moderate nonproliferative diabetic retinopathy without macular edema: Secondary | ICD-10-CM

## 2015-02-26 DIAGNOSIS — E1165 Type 2 diabetes mellitus with hyperglycemia: Secondary | ICD-10-CM

## 2015-02-26 DIAGNOSIS — E1140 Type 2 diabetes mellitus with diabetic neuropathy, unspecified: Secondary | ICD-10-CM

## 2015-02-26 LAB — COMPLETE METABOLIC PANEL WITH GFR
ALBUMIN: 4.4 g/dL (ref 3.5–5.2)
ALT: 28 U/L (ref 0–53)
AST: 28 U/L (ref 0–37)
Alkaline Phosphatase: 101 U/L (ref 39–117)
BUN: 16 mg/dL (ref 6–23)
CALCIUM: 9.3 mg/dL (ref 8.4–10.5)
CHLORIDE: 95 meq/L — AB (ref 96–112)
CO2: 26 mEq/L (ref 19–32)
Creat: 0.54 mg/dL (ref 0.50–1.35)
GFR, Est African American: >89 mL/min
GFR, Est Non African American: >89 mL/min
Glucose, Bld: 330 mg/dL — ABNORMAL HIGH (ref 70–99)
POTASSIUM: 4.1 meq/L (ref 3.5–5.3)
Sodium: 132 mEq/L — ABNORMAL LOW (ref 135–145)
Total Bilirubin: 0.4 mg/dL (ref 0.2–1.2)
Total Protein: 7.3 g/dL (ref 6.0–8.3)

## 2015-02-26 LAB — HM DIABETES EYE EXAM

## 2015-02-26 LAB — POCT GLYCOSYLATED HEMOGLOBIN (HGB A1C): Hemoglobin A1C: 11.6

## 2015-02-26 LAB — GLUCOSE, CAPILLARY: GLUCOSE-CAPILLARY: 328 mg/dL — AB (ref 70–99)

## 2015-02-26 MED ORDER — METFORMIN HCL ER 500 MG PO TB24: 500.0000 mg | ORAL_TABLET | Freq: Two times a day (BID) | ORAL | Status: DC

## 2015-02-26 MED ORDER — "INSULIN SYRINGE-NEEDLE U-100 31G X 3/8"" 0.5 ML MISC": 1.0000 [IU] | Freq: Three times a day (TID) | Status: DC

## 2015-02-26 MED ORDER — GLUCOSE BLOOD VI STRP: ORAL_STRIP | Status: DC

## 2015-02-26 MED ORDER — INSULIN NPH ISOPHANE & REGULAR (70-30) 100 UNIT/ML ~~LOC~~ SUSP: 25.0000 [IU] | Freq: Two times a day (BID) | SUBCUTANEOUS | Status: DC

## 2015-02-26 MED ORDER — AGAMATRIX PRESTO PRO METER DEVI: 1.0000 [IU] | Freq: Three times a day (TID) | Status: DC

## 2015-02-26 MED ORDER — GABAPENTIN 300 MG PO CAPS: 300.0000 mg | ORAL_CAPSULE | Freq: Three times a day (TID) | ORAL | Status: DC

## 2015-02-26 MED ORDER — LANCETS MISC: 1.0000 [IU] | Freq: Three times a day (TID) | Status: DC

## 2015-02-26 NOTE — Progress Notes (Signed)
[facility] Subjective:   Patient ID: Jason [name] male   DOB: Nov 24, 1975 39 y.o.   MRN: [id number]  HPI: [name] is a 39 y.o. male with a PMH detailed below who presents for to reestablish care.  He has a history of IDDM, he was previously seen in our clinic but has not been seen since 2014.  He was hospitalized at that time for DKA and never followed up.  DM He reports he has been taking about 25 u of Novolin BID, however he frequently skips and trys to save insulin because it is expensive for him.  He does not check his sugars because of the cost.  He feels he may have been hypoglycemic sometimes and when he fells odd he ususally drinks a half a can of Coke.  Groin pain He reports he has been having swelling in his gentile region. He feels his hernia is back,  He had it surgically repaired a few years ago at [facility].  He reports that over the past few months the swelling has gotten larger.  It sometimes is painful but not severely.  On his Medical history he has seronegative RA listed and is supposed to be managed by Dr Corliss Skains.  Pain in legs and feet: Patient reports numbness and burning in his legs, this has been happening for at least the past few months.  Past Medical History  Diagnosis Date  . DKA (diabetic ketoacidoses)     hospitalized in 03/10/08  . Diabetes mellitus     diagnosed in april when admitted to [facility] with blood glucose of 581. HbA1C was 9.9  . Rheumatoid arthritis(714.0)     managed by Dr. Pollyann Savoy. sronegative. Dr. 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  . Latent tuberculosis infection     PPD test measured 12 mm on 05/25/08. CXR on 06/03/08 showed no active disease or adenopathy. Started to take isoniazid 300 mg daily. on 06/11/08 and will  take medication  along with Vit  B6 for 9 months.   Current Outpatient Prescriptions  Medication Sig Dispense Refill  .  insulin NPH-regular Human (NOVOLIN 70/30) (70-30) 100 UNIT/ML injection Inject 25 Units into the skin 2 (two) times daily. 10 mL 11  . albuterol (PROVENTIL HFA) 108 (90 BASE) MCG/ACT inhaler Inhale 2 puffs into the lungs every 6 (six) hours as needed for wheezing. 8.5 g 0  . Blood Glucose Monitoring Suppl (AGAMATRIX PRESTO PRO METER) DEVI 1 Units by Does not apply route 3 (three) times daily before meals. 1 Device 0  . FLUoxetine (PROZAC) 20 MG capsule Take 1 capsule (20 mg total) by mouth daily. 30 capsule 2  . gabapentin (NEURONTIN) 300 MG capsule Take 1 capsule (300 mg total) by mouth 3 (three) times daily. 90 capsule 2  . glucose blood test strip Use as instructed 100 each 11  . Insulin Syringe-Needle U-100 31G X 3/8" 0.5 ML MISC 1 Units by Does not apply route 3 (three) times daily before meals. 100 each 11  . Insulin Syringes, Disposable, U-100 1 ML MISC 1 Device by Does not apply route 2 (two) times daily before a meal. (Patient not taking: Reported on 02/26/2015) 120 each 11  . Lancets MISC 1 Units by Does not apply route 3 (three) times daily before meals. 100 each 11  . metFORMIN (GLUCOPHAGE XR) 500 MG 24 hr tablet Take 1 tablet (500 mg total) by mouth 2 (  two) times daily. 60 tablet 11   No current facility-administered medications for this visit.   Family History  Problem Relation Age of Onset  . Diabetes Cousin   . Hypertension Cousin    History   Social History  . Marital Status: Single    Spouse Name: N/A  . Number of Children: N/A  . Years of Education: 9   Occupational History  .     Social History Main Topics  . Smoking status: Never Smoker   . Smokeless tobacco: Never Used  . Alcohol Use: No  . Drug Use: No  . Sexual Activity: Not on file   Other Topics Concern  . 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   Review of Systems: Review of Systems  Constitutional: Negative for fever, chills,  weight loss and malaise/fatigue.  Eyes: Negative for blurred vision.  Respiratory: Negative for cough and shortness of breath.   Cardiovascular: Negative for chest pain and leg swelling.  Gastrointestinal: Negative for heartburn and abdominal pain.  Genitourinary: Positive for frequency. Negative for dysuria.  Musculoskeletal: Positive for joint pain (occasionally). Negative for myalgias.  Neurological: Negative for dizziness and headaches.  Endo/Heme/Allergies: Positive for polydipsia.  Psychiatric/Behavioral: Negative for substance abuse.     Objective:  Physical Exam: Filed Vitals:   02/26/15 1425  BP: 141/81  Pulse: 80  Temp: 98.4 F (36.9 C)  TempSrc: Oral  Height: 5\' 3"  (1.6 m)  Weight: 135 lb 1.6 oz (61.281 kg)  SpO2: 100%  Physical Exam  Constitutional: He is well-developed, well-nourished, and in no distress. No distress.  HENT:  Head: Normocephalic and atraumatic.  Eyes: Conjunctivae are normal.  Cardiovascular: Normal rate, regular rhythm, normal heart sounds and intact distal pulses.   No murmur heard. 2+ dp and pt pulses bilaterally  Pulmonary/Chest: Effort normal and breath sounds normal. No respiratory distress. He has no wheezes. He has no rales.  Abdominal: Soft. Bowel sounds are normal. He exhibits no distension. There is no tenderness.  Genitourinary: He exhibits abnormal scrotal mass. He exhibits no abnormal testicular mass, no testicular tenderness and no scrotal tenderness.  Right inguinal hernia present.  Unable to reduce.  Musculoskeletal: He exhibits no edema.  I examined his hands, wrists, elbows, shoulders, knees and ankles, he had no erythema warmth or tenderness.  No disfigurement of hands.  Neurological:  Monofilament foot exam wnl bilaterally  Skin: Skin is warm and dry. He is not diaphoretic.  Psychiatric: Affect and judgment normal.  Nursing note and vitals reviewed.   Assessment & Plan:  Case discussed with Dr.  Essential  hypertension - Patient has reported history of HTN but BP has been well controlled in our system.  He has a borderline BP today. - Will continue to monitor, if needed would favor started ACEi given his uncontrolled DM.   Insulin dependent type 2 diabetes mellitus, uncontrolled Lab Results  Component Value Date   HGBA1C 11.6 02/26/2015   HGBA1C 11.6* 11/04/2013   HGBA1C 10.1 03/21/2013     Assessment: Diabetes control:  uncontrolled Progress toward A1C goal:   unchanged Comments: He has a reported history of T2DM, he has had DKA at least 2x in the past suggesting IDDM.  His BM is in the overweight category.  He may be a type 1 DM, but I would expect him to have more complications and less insulin resistance.  Plan: Medications:  Will continue Novolin 25u BID,  Add Metformin 500mg  XR BID Home glucose monitoring: Frequency:  twice a day Timing:  before meals Instruction/counseling given: reminded to get eye exam, reminded to bring blood glucose meter & log to each visit, reminded to bring medications to each visit and discussed diet Educational resources provided:   Self management tools provided:   Other plans: Will do trial of Metformin as this is cheap, he has good renal function, and is not clearly a type 1.  I did obtain a retinal scan on him.  As well as Microalbumin and Cr. The most important thing will be to make sure he can afford and continue hs medications,  I have stressed getting the orange card.  I tried to explain to him that he can get Novolin from [facility] without a Rx but he seems unsure, so I have given him a written Rx for this as well as [facility] [place] [facility] so he can get his insulin there for cheaper when he gets his orange card.  I have also ordered him testing supplies.      Recurrent right inguinal hernia - He does have an inguinal hernia on exam. I was unable to reduce it.  This will likely need to be fixed surgically. He has no insurance and  will have difficulty affording the operation.  I have discussed him getting the orange card and then we will be able to refer him for surgical evaluation.   I did discuss warning signs for incarceration and warn him to go to the ED if those developed.   Seronegative rheumatoid arthritis - He has apparently been managed by Dr Corliss Skains in the past but never started on MTX.  He does report occasional joint pain but does not note anything significant.  Today on exam he has no acutely inflamed joints. -Will we try to get him back plugged in and possibly started on treatment, although it is difficult to confirm the presumed diagnosis at this time.  Overall he has bigger issues at this time.   Diabetic neuropathy, painful - Will start trial of gabapentin, I emphasized that we need better glucose control so this does not get worse. - Gabapentin 300mg  QHS, can slowly titrate up to TID.     Medications Ordered Meds ordered this encounter  Medications  . insulin NPH-regular Human (NOVOLIN 70/30) (70-30) 100 UNIT/ML injection    Sig: Inject 25 Units into the skin 2 (two) times daily.    Dispense:  10 mL    Refill:  11  . Insulin Syringe-Needle U-100 31G X 3/8" 0.5 ML MISC    Sig: 1 Units by Does not apply route 3 (three) times daily before meals.    Dispense:  100 each    Refill:  11  . glucose blood test strip    Sig: Use as instructed    Dispense:  100 each    Refill:  11    The patient is insulin requiring, ICD 10 code E11.65. The patient tests 3 times per day.  . Blood Glucose Monitoring Suppl (AGAMATRIX PRESTO PRO METER) DEVI    Sig: 1 Units by Does not apply route 3 (three) times daily before meals.    Dispense:  1 Device    Refill:  0  . Lancets MISC    Sig: 1 Units by Does not apply route 3 (three) times daily before meals.    Dispense:  100 each    Refill:  11    The  patient is insulin requiring, ICD 10 code E11.65. The patient tests 3 times per day.  . gabapentin (NEURONTIN)  300 MG capsule    Sig: Take 1 capsule (300 mg total) by mouth 3 (three) times daily.    Dispense:  90 capsule    Refill:  2  . metFORMIN (GLUCOPHAGE XR) 500 MG 24 hr tablet    Sig: Take 1 tablet (500 mg total) by mouth 2 (two) times daily.    Dispense:  60 tablet    Refill:  11   Other Orders Orders Placed This Encounter  Procedures  . Microalbumin / Creatinine Urine Ratio  . CMP with Estimated GFR (CPT-80053)  . Glucose, capillary  . POC Hbg A1C  . Retinal/fundus photography    Standing Status: Future     Number of Occurrences: 1     Standing Expiration Date: 02/26/2016

## 2015-02-26 NOTE — Patient Instructions (Signed)
General Instructions:   Please bring your medicines with you each time you come to clinic.  Medicines may include prescription medications, over-the-counter medications, herbal remedies, eye drops, vitamins, or other pills.   Progress Toward Treatment Goals:  No flowsheet data found.  Self Care Goals & Plans:  Self Care Goal 03/21/2013  Manage my medications take my medicines as prescribed; bring my medications to every visit; refill my medications on time  Monitor my health keep track of my blood glucose; bring my glucose meter and log to each visit  Eat healthy foods drink diet soda or water instead of juice or soda; eat more vegetables; eat foods that are low in salt    No flowsheet data found.   Care Management & Community Referrals:  No flowsheet data found.

## 2015-02-26 NOTE — Progress Notes (Signed)
Internal Medicine Clinic Attending  Case discussed with Dr. Hoffman at the time of the visit.  We reviewed the resident's history and exam and pertinent patient test results.  I agree with the assessment, diagnosis, and plan of care documented in the resident's note.  

## 2015-02-27 LAB — MICROALBUMIN / CREATININE URINE RATIO
Creatinine, Urine: 17.4 mg/dL
MICROALB UR: 4.8 mg/dL — AB (ref <2.0)
MICROALB/CREAT RATIO: 275.9 mg/g — AB (ref 0.0–30.0)

## 2015-02-28 ENCOUNTER — Encounter: Payer: Self-pay | Admitting: Internal Medicine

## 2015-02-28 DIAGNOSIS — E1140 Type 2 diabetes mellitus with diabetic neuropathy, unspecified: Secondary | ICD-10-CM | POA: Insufficient documentation

## 2015-02-28 NOTE — Assessment & Plan Note (Signed)
Lab Results  Component Value Date   HGBA1C 11.6 02/26/2015   HGBA1C 11.6* 11/04/2013   HGBA1C 10.1 03/21/2013     Assessment: Diabetes control:  uncontrolled Progress toward A1C goal:   unchanged Comments: He has a reported history of T2DM, he has had DKA at least 2x in the past suggesting IDDM.  His BM is in the overweight category.  He may be a type 1 DM, but I would expect him to have more complications and less insulin resistance.  Plan: Medications:  Will continue Novolin 25u BID, Add Metformin 500mg  XR BID Home glucose monitoring: Frequency:  twice a day Timing:  before meals Instruction/counseling given: reminded to get eye exam, reminded to bring blood glucose meter & log to each visit, reminded to bring medications to each visit and discussed diet Educational resources provided:   Self management tools provided:   Other plans: Will do trial of Metformin as this is cheap, he has good renal function, and is not clearly a type 1.  I did obtain a retinal scan on him.  As well as Microalbumin and Cr. The most important thing will be to make sure he can afford and continue hs medications,  I have stressed getting the orange card.  I tried to explain to him that he can get Novolin from [facility] without a Rx but he seems unsure, so I have given him a written Rx for this as well as [facility]ed [facility] so he can get his insulin there for cheaper when he gets his orange card.  I have also ordered him testing supplies.

## 2015-02-28 NOTE — Assessment & Plan Note (Signed)
-   He has apparently been managed by Dr Corliss Skains in the past but never started on MTX.  He does report occasional joint pain but does not note anything significant.  Today on exam he has no acutely inflamed joints. -Will we try to get him back plugged in and possibly started on treatment, although it is difficult to confirm the presumed diagnosis at this time.  Overall he has bigger issues at this time.

## 2015-02-28 NOTE — Assessment & Plan Note (Signed)
-   He does have an inguinal hernia on exam. I was unable to reduce it.  This will likely need to be fixed surgically. He has no insurance and will have difficulty affording the operation.  I have discussed him getting the orange card and then we will be able to refer him for surgical evaluation.   I did discuss warning signs for incarceration and warn him to go to the ED if those developed.

## 2015-02-28 NOTE — Assessment & Plan Note (Signed)
-   Will start trial of gabapentin, I emphasized that we need better glucose control so this does not get worse. - Gabapentin 300mg  QHS, can slowly titrate up to TID.

## 2015-02-28 NOTE — Assessment & Plan Note (Signed)
-   Patient has reported history of HTN but BP has been well controlled in our system.  He has a borderline BP today. - Will continue to monitor, if needed would favor started ACEi given his uncontrolled DM.

## 2015-03-01 ENCOUNTER — Ambulatory Visit: Payer: Self-pay

## 2015-03-02 ENCOUNTER — Encounter: Payer: Self-pay | Admitting: *Deleted

## 2015-03-05 ENCOUNTER — Encounter: Payer: Self-pay | Admitting: Internal Medicine

## 2015-03-05 DIAGNOSIS — E113399 Type 2 diabetes mellitus with moderate nonproliferative diabetic retinopathy without macular edema, unspecified eye: Secondary | ICD-10-CM | POA: Insufficient documentation

## 2015-03-25 ENCOUNTER — Telehealth: Payer: Self-pay | Admitting: Pulmonary Disease

## 2015-03-25 NOTE — Telephone Encounter (Signed)
Call to patient to confirm appointment for 03/29/15 at 10:15 lmtcb ° °

## 2015-03-29 ENCOUNTER — Ambulatory Visit: Payer: Self-pay | Admitting: Pulmonary Disease

## 2015-04-01 ENCOUNTER — Encounter: Payer: Self-pay | Admitting: Pulmonary Disease

## 2015-04-08 ENCOUNTER — Ambulatory Visit: Payer: Self-pay | Admitting: Pulmonary Disease

## 2015-06-03 ENCOUNTER — Encounter: Payer: Self-pay | Admitting: Pulmonary Disease

## 2015-07-21 ENCOUNTER — Telehealth: Payer: Self-pay | Admitting: Pulmonary Disease

## 2015-07-21 NOTE — Telephone Encounter (Signed)
Call to patient to confirm appointment for 07/22/15 at 2:15 lmtcb

## 2015-07-22 ENCOUNTER — Encounter: Payer: Self-pay | Admitting: Pulmonary Disease

## 2015-12-09 ENCOUNTER — Encounter: Payer: Self-pay | Admitting: Pulmonary Disease

## 2015-12-09 ENCOUNTER — Ambulatory Visit (INDEPENDENT_AMBULATORY_CARE_PROVIDER_SITE_OTHER): Payer: Self-pay | Admitting: Pulmonary Disease

## 2015-12-09 VITALS — BP 141/93 | HR 84 | Temp 98.0°F | Resp 18 | Ht 60.0 in | Wt 131.5 lb

## 2015-12-09 DIAGNOSIS — Z9119 Patient's noncompliance with other medical treatment and regimen: Secondary | ICD-10-CM

## 2015-12-09 DIAGNOSIS — IMO0002 Reserved for concepts with insufficient information to code with codable children: Secondary | ICD-10-CM

## 2015-12-09 DIAGNOSIS — E113312 Type 2 diabetes mellitus with moderate nonproliferative diabetic retinopathy with macular edema, left eye: Secondary | ICD-10-CM

## 2015-12-09 DIAGNOSIS — E1165 Type 2 diabetes mellitus with hyperglycemia: Secondary | ICD-10-CM

## 2015-12-09 DIAGNOSIS — E113399 Type 2 diabetes mellitus with moderate nonproliferative diabetic retinopathy without macular edema, unspecified eye: Secondary | ICD-10-CM

## 2015-12-09 DIAGNOSIS — K4091 Unilateral inguinal hernia, without obstruction or gangrene, recurrent: Secondary | ICD-10-CM

## 2015-12-09 DIAGNOSIS — E785 Hyperlipidemia, unspecified: Secondary | ICD-10-CM

## 2015-12-09 DIAGNOSIS — Z794 Long term (current) use of insulin: Secondary | ICD-10-CM

## 2015-12-09 DIAGNOSIS — I10 Essential (primary) hypertension: Secondary | ICD-10-CM

## 2015-12-09 DIAGNOSIS — E113391 Type 2 diabetes mellitus with moderate nonproliferative diabetic retinopathy without macular edema, right eye: Secondary | ICD-10-CM

## 2015-12-09 DIAGNOSIS — Z23 Encounter for immunization: Secondary | ICD-10-CM

## 2015-12-09 LAB — GLUCOSE, CAPILLARY: GLUCOSE-CAPILLARY: 259 mg/dL — AB (ref 65–99)

## 2015-12-09 LAB — HM DIABETES EYE EXAM

## 2015-12-09 LAB — POCT GLYCOSYLATED HEMOGLOBIN (HGB A1C): Hemoglobin A1C: 11.4

## 2015-12-09 MED ORDER — METFORMIN HCL ER 500 MG PO TB24: 500.0000 mg | ORAL_TABLET | Freq: Two times a day (BID) | ORAL | Status: DC

## 2015-12-09 MED ORDER — PRAVASTATIN SODIUM 40 MG PO TABS: 40.0000 mg | ORAL_TABLET | Freq: Every day | ORAL | Status: DC

## 2015-12-09 MED ORDER — INSULIN NPH ISOPHANE & REGULAR (70-30) 100 UNIT/ML ~~LOC~~ SUSP: 25.0000 [IU] | Freq: Two times a day (BID) | SUBCUTANEOUS | Status: DC

## 2015-12-09 MED ORDER — LISINOPRIL 2.5 MG PO TABS: 2.5000 mg | ORAL_TABLET | Freq: Every day | ORAL | Status: DC

## 2015-12-09 NOTE — Progress Notes (Signed)
Subjective:    Patient ID: Jason [name], male    DOB: [birth year], 40 y.o.   MRN: [id number]  HPI [name] is a 40 year old man with history of DM2, HTN, RA presenting for follow up of DM2.  He denies episodes of symptomatic hypoglycemia. He has been trying to watch his diet by eating more salads and avoiding fast foods and fatty foods. He has been taking 25u of insulin in the morning and 15u in the evening.  He has a right sided inguinal hernia. He previously had a hernia repair there about 5 years ago. He bulge worsens with standing and walking. He is able to lie down and reduce the hernia on his own. Denies nausea, vomiting, change in bowel habits, abdominal pain. No episodes of incarceration of the hernia. No overlying skin changes.  Review of Systems Constitutional: no fevers/chills Respiratory: no shortness of breath Cardiovascular: no chest pain  Past Medical History  Diagnosis Date  . DKA (diabetic ketoacidoses)     hospitalized in 03/10/08  . Diabetes mellitus     diagnosed in april when admitted to [facility] with blood glucose of 581. HbA1C was 9.9  . Rheumatoid arthritis(714.0)     managed by Dr. Pollyann Savoy. sronegative. Dr. 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  . Latent tuberculosis infection     PPD test measured 12 mm on 05/25/08. CXR on 06/03/08 showed no active disease or adenopathy. Started to take isoniazid 300 mg daily. on 06/11/08 and will  take medication  along with Vit  B6 for 9 months.    Current Outpatient Prescriptions on File Prior to Visit  Medication Sig Dispense Refill  . albuterol (PROVENTIL HFA) 108 (90 BASE) MCG/ACT inhaler Inhale 2 puffs into the lungs every 6 (six) hours as needed for wheezing. 8.5 g 0  . Blood Glucose Monitoring Suppl (AGAMATRIX PRESTO PRO METER) DEVI 1 Units by Does not apply route 3 (three) times daily before meals. 1 Device 0  . FLUoxetine (PROZAC) 20 MG  capsule Take 1 capsule (20 mg total) by mouth daily. 30 capsule 2  . gabapentin (NEURONTIN) 300 MG capsule Take 1 capsule (300 mg total) by mouth 3 (three) times daily. 90 capsule 2  . glucose blood test strip Use as instructed 100 each 11  . insulin NPH-regular Human (NOVOLIN 70/30) (70-30) 100 UNIT/ML injection Inject 25 Units into the skin 2 (two) times daily. 10 mL 11  . Insulin Syringe-Needle U-100 31G X 3/8" 0.5 ML MISC 1 Units by Does not apply route 3 (three) times daily before meals. 100 each 11  . Insulin Syringes, Disposable, U-100 1 ML MISC 1 Device by Does not apply route 2 (two) times daily before a meal. (Patient not taking: Reported on 02/26/2015) 120 each 11  . Lancets MISC 1 Units by Does not apply route 3 (three) times daily before meals. 100 each 11  . metFORMIN (GLUCOPHAGE XR) 500 MG 24 hr tablet Take 1 tablet (500 mg total) by mouth 2 (two) times daily. 60 tablet 11   No current facility-administered medications on file prior to visit.    Today's Vitals   12/09/15 0856  BP: 141/93  Pulse: 84  Temp: 98 F (36.7 C)  TempSrc: Oral  Resp: 18  Height: 5' (1.524 m)  Weight: 131 lb 8 oz (59.648 kg)  SpO2: 98%   Objective:   Physical Exam  Constitutional: He is oriented to person,  place, and time. He appears well-developed and well-nourished. No distress.  Abdominal: Soft. He exhibits mass (R groin, reducible). He exhibits no distension. There is no tenderness.  Musculoskeletal: Normal range of motion. He exhibits no edema.  Neurological: He is alert and oriented to person, place, and time.  Skin: Skin is warm and dry. No rash noted. No erythema.   Assessment & Plan:  Please refer to problem based charting.

## 2015-12-09 NOTE — Patient Instructions (Addendum)
Tome insulina: 25 en la maana con desayuno y 25 en la tarde con la cena.  Tome metformin una pldora en la maana con desayuno y una pldora en la tarde con la cena  Diabetes mellitus tipo2, adultos (Type 2 Diabetes Mellitus, Adult)   Generalmente, el objetivo del tratamiento es [employer] siguientes niveles sanguneos de glucosa:  Antes de las comidas (preprandial): de 80 a 130 mg/dl.  A1c: menos del 6,5 % al 7 %. CUIDADOS EN EL HOGAR   Mdase el nivel de azcar en la sangre todos los [address]  [address] antes de las comidas.  [employer] medicamentos para la diabetes o para la insulina tal como se los prescribi el mdico.  [place] se quede sin insulina.  Note los sntomas de niveles bajos de Banker (hipoglucemia), como:  Sacudidas (temblores).  Dificultad para pensar claramente.  Sudoracin.  Frecuencia cardaca acelerada.  Dolor de [place].  Sequedad en la boca.  Hambre.  Malhumor (irritabilidad).  Preocupacin o tensin (ansiedad).  Sueo agitado.  Cambios en el habla o en la coordinacin.  Confusin.  Controle la hipoglucemia inmediatamente.   Tome entre 15 y 20 gramos de glucosa o hidratos de carbono de accin rpida. Incluye un gel de glucosa, pastillas de glucosa o 4 onzas (120 mililitros) de jugo de frutas, gaseosa comn, o [employer].  Verifique su nivel de azcar en la sangre despus de tomar la glucosa.  Tome entre 15 y 20gramos ms de la glucosa si al repetir el anlisis del nivel de azcar en la sangre todava es de 70mg /dl (miligramos/decilitro) o inferior.  Ingiera una comida o un aperitivo 1 hora despus de los niveles de Psychologist, occupational.  SOLICITE AYUDA SI:  No puede comer o beber durante ms de 6horas.  Tiene nuseas o vomita durante ms de 6horas.  Su nivel de azcar en la sangre es mayor de 240 mg/dl.  Presenta algn cambio en el estado mental.  Tiene otra enfermedad grave.  Tiene heces lquidas  (diarrea) durante ms de 6horas.  Ha estado enfermo o ha tenido fiebre por dos o Banker y no se [address].  Tiene dolor cuando est activo. SOLICITE AYUDA DE INMEDIATO SI:  Tiene dificultad para respirar.  Los niveles de cetona estn ms altos de lo que el mdico dice que Aeronautical engineer. ASEGRESE DE QUE:  Comprende estas instrucciones.  Controlar su afeccin.  Recibir ayuda de inmediato si no mejora o si empeora.   Esta informacin no tiene Fish farm manager el consejo del mdico. Asegrese de hacerle al mdico cualquier pregunta que tenga.   Document Released: 02/02/2009 Document Revised: 03/23/2015 Elsevier Interactive Patient Education 05/23/2015.

## 2015-12-10 LAB — LIPID PANEL
CHOL/HDL RATIO: 5.7 ratio — AB (ref 0.0–5.0)
Cholesterol, Total: 283 mg/dL — ABNORMAL HIGH (ref 100–199)
HDL: 50 mg/dL (ref >39)
LDL Calculated: 208 mg/dL — ABNORMAL HIGH (ref 0–99)
Triglycerides: 125 mg/dL (ref 0–149)
VLDL Cholesterol Cal: 25 mg/dL (ref 5–40)

## 2015-12-10 LAB — CMP14 + ANION GAP
ALT: 37 IU/L (ref 0–44)
ANION GAP: 20 mmol/L — AB (ref 10.0–18.0)
AST: 30 IU/L (ref 0–40)
Albumin/Globulin Ratio: 1.7 (ref 1.1–2.5)
Albumin: 4.7 g/dL (ref 3.5–5.5)
Alkaline Phosphatase: 101 IU/L (ref 39–117)
BUN/Creatinine Ratio: 20 — ABNORMAL HIGH (ref 8–19)
BUN: 11 mg/dL (ref 6–20)
Bilirubin Total: 0.5 mg/dL (ref 0.0–1.2)
CALCIUM: 9.7 mg/dL (ref 8.7–10.2)
CO2: 22 mmol/L (ref 18–29)
CREATININE: 0.55 mg/dL — AB (ref 0.76–1.27)
Chloride: 93 mmol/L — ABNORMAL LOW (ref 96–106)
GFR calc Af Amer: 154 mL/min/{1.73_m2} (ref >59)
GFR, EST NON AFRICAN AMERICAN: 133 mL/min/{1.73_m2} (ref >59)
GLUCOSE: 262 mg/dL — AB (ref 65–99)
Globulin, Total: 2.8 g/dL (ref 1.5–4.5)
Potassium: 4.3 mmol/L (ref 3.5–5.2)
Sodium: 135 mmol/L (ref 134–144)
Total Protein: 7.5 g/dL (ref 6.0–8.5)

## 2015-12-10 NOTE — Assessment & Plan Note (Signed)
BP Readings from Last 3 Encounters:  12/09/15 141/93  02/26/15 141/81  11/08/13 121/77    Lab Results  Component Value Date   NA 135 12/09/2015   K 4.3 12/09/2015   CREATININE 0.55* 12/09/2015    Assessment: Blood pressure control: Mildly elevated Progress toward BP goal:  Stable  Plan: Medications:  Start on lisinopril 2.5mg  daily. Other plans:  -Follow up in 2 weeks.

## 2015-12-10 NOTE — Assessment & Plan Note (Signed)
Assessment: reducible right inguinal hernia  Plan: -He will work on getting orange card so we can refer him to gen surg -Instructed him on return precautions

## 2015-12-10 NOTE — Assessment & Plan Note (Signed)
Lab Results  Component Value Date   HGBA1C 11.4 12/09/2015   HGBA1C 11.6 02/26/2015   HGBA1C 11.6* 11/04/2013     Assessment: Diabetes control: Uncontrolled Progress toward A1C goal:  Unchanged Comments: Poor follow up and noncompliance  Plan: Medications:  Increase insulin NPH-regular 70/30 to 25 units BID. Restart metformin 500mg  24 hr tablet BID. Other plans:  -Follow up in 2 weeks with glucose meter for further titration. He may need to just be on insulin and discontinue metformin.

## 2015-12-10 NOTE — Assessment & Plan Note (Signed)
Assessment: He has been on pravastatin for primary prevention in setting of diabetes  Plan: -Check lipid panel and CMP today

## 2015-12-13 NOTE — Progress Notes (Signed)
Case discussed with Dr. Krall soon after the resident saw the patient.  We reviewed the resident's history and exam and pertinent patient test results.  I agree with the assessment, diagnosis, and plan of care documented in the resident's note. 

## 2015-12-21 ENCOUNTER — Encounter: Payer: Self-pay | Admitting: Dietician

## 2015-12-23 ENCOUNTER — Encounter: Payer: Self-pay | Admitting: Pulmonary Disease

## 2015-12-23 ENCOUNTER — Ambulatory Visit (INDEPENDENT_AMBULATORY_CARE_PROVIDER_SITE_OTHER): Payer: Self-pay | Admitting: Pulmonary Disease

## 2015-12-23 VITALS — BP 129/82 | HR 84 | Temp 98.5°F | Ht 60.0 in | Wt 132.2 lb

## 2015-12-23 DIAGNOSIS — E1165 Type 2 diabetes mellitus with hyperglycemia: Secondary | ICD-10-CM

## 2015-12-23 DIAGNOSIS — E113399 Type 2 diabetes mellitus with moderate nonproliferative diabetic retinopathy without macular edema, unspecified eye: Secondary | ICD-10-CM

## 2015-12-23 DIAGNOSIS — IMO0002 Reserved for concepts with insufficient information to code with codable children: Secondary | ICD-10-CM

## 2015-12-23 DIAGNOSIS — Z79899 Other long term (current) drug therapy: Secondary | ICD-10-CM

## 2015-12-23 DIAGNOSIS — E1140 Type 2 diabetes mellitus with diabetic neuropathy, unspecified: Secondary | ICD-10-CM

## 2015-12-23 DIAGNOSIS — I10 Essential (primary) hypertension: Secondary | ICD-10-CM

## 2015-12-23 DIAGNOSIS — E113393 Type 2 diabetes mellitus with moderate nonproliferative diabetic retinopathy without macular edema, bilateral: Secondary | ICD-10-CM

## 2015-12-23 DIAGNOSIS — Z794 Long term (current) use of insulin: Secondary | ICD-10-CM

## 2015-12-23 DIAGNOSIS — Z7984 Long term (current) use of oral hypoglycemic drugs: Secondary | ICD-10-CM

## 2015-12-23 MED ORDER — GABAPENTIN 300 MG PO CAPS: 300.0000 mg | ORAL_CAPSULE | Freq: Three times a day (TID) | ORAL | Status: DC

## 2015-12-23 MED ORDER — METFORMIN HCL ER 500 MG PO TB24: 1000.0000 mg | ORAL_TABLET | Freq: Two times a day (BID) | ORAL | Status: DC

## 2015-12-23 NOTE — Patient Instructions (Signed)
GABAPENTIN: Este medicamento se [place] para tratar dolores crnicos relacionados con los nervios. -Tomar una tableta tres veces al da, por la maana, por la tarde, ya la hora de [place].

## 2015-12-23 NOTE — Assessment & Plan Note (Signed)
Assessment: Worsening diabetic neuropathy for the past 4 months.  Plan: -Gabapentin 300mg  TID.

## 2015-12-23 NOTE — Assessment & Plan Note (Signed)
Assessment: Recent retinal camera with QQ:IWLN Moderate w/CSME, LG:XQJJ Moderate  Plan: Referral to retinal specialist.

## 2015-12-23 NOTE — Assessment & Plan Note (Addendum)
Assessment: CBG on meter 446 on 12/17/2015-> 282, 281 on 12/20/2015 -> 120, 283 on 12/22/2015. 106 this morning. He is taking Glucophage XR 500mg  2 tablets BID and Novolin 70/30 25u BID.  Plan: -Continue Glucophage XR 500mg  2 tablets BID and Novolin 70/30 25u BID. -Follow up in 2 months for recheck of A1c -Consider endocrine referral

## 2015-12-23 NOTE — Progress Notes (Signed)
Subjective:    Patient ID: Jason [name], male    DOB: [birth year], 40 y.o.   MRN: [id number]  HPI Jason [name] is a 40 year old man with history of DM2, HTN, RA presenting for follow up of DM2.  He reports he has had a few episodes of symptomatic hypoglycemia. Last episode last week. He did not check his CBG because he did not have his meter with him. He drank some soda with improvement in his symptoms.  He reports bilateral foot pain. The pain is a burning sensation with tingling and numbness. He has had it for the past 4 months.   Review of Systems Constitutional: no fevers/chills Respiratory: no shortness of breath Cardiovascular: no chest pain  Past Medical History  Diagnosis Date  . DKA (diabetic ketoacidoses) (HCC)     hospitalized in 03/10/08  . Diabetes mellitus     diagnosed in april when admitted to [facility] with blood glucose of 581. HbA1C was 9.9  . Rheumatoid arthritis(714.0)     managed by Dr. Pollyann Savoy. sronegative. Dr. 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  . Latent tuberculosis infection     PPD test measured 12 mm on 05/25/08. CXR on 06/03/08 showed no active disease or adenopathy. Started to take isoniazid 300 mg daily. on 06/11/08 and will  take medication  along with Vit  B6 for 9 months.    Current Outpatient Prescriptions on File Prior to Visit  Medication Sig Dispense Refill  . albuterol (PROVENTIL HFA) 108 (90 BASE) MCG/ACT inhaler Inhale 2 puffs into the lungs every 6 (six) hours as needed for wheezing. 8.5 g 0  . Blood Glucose Monitoring Suppl (AGAMATRIX PRESTO PRO METER) DEVI 1 Units by Does not apply route 3 (three) times daily before meals. 1 Device 0  . glucose blood test strip Use as instructed 100 each 11  . insulin NPH-regular Human (NOVOLIN 70/30) (70-30) 100 UNIT/ML injection Inject 25 Units into the skin 2 (two) times daily. 10 mL 6  . Insulin Syringe-Needle U-100 31G X 3/8"  0.5 ML MISC 1 Units by Does not apply route 3 (three) times daily before meals. 100 each 11  . Insulin Syringes, Disposable, U-100 1 ML MISC 1 Device by Does not apply route 2 (two) times daily before a meal. (Patient not taking: Reported on 02/26/2015) 120 each 11  . Lancets MISC 1 Units by Does not apply route 3 (three) times daily before meals. 100 each 11  . lisinopril (PRINIVIL,ZESTRIL) 2.5 MG tablet Take 1 tablet (2.5 mg total) by mouth daily. 30 tablet 6  . metFORMIN (GLUCOPHAGE XR) 500 MG 24 hr tablet Take 1 tablet (500 mg total) by mouth 2 (two) times daily. 60 tablet 6  . pravastatin (PRAVACHOL) 40 MG tablet Take 1 tablet (40 mg total) by mouth daily. 30 tablet 6   No current facility-administered medications on file prior to visit.    Today's Vitals   12/23/15 1324  BP: 129/82  Pulse: 84  Temp: 98.5 F (36.9 C)  TempSrc: Oral  Height: 5' (1.524 m)  Weight: 132 lb 3.2 oz (59.966 kg)  SpO2: 99%  PainSc: 0-No pain    Objective:   Physical Exam  Assessment & Plan:  40 year old man with history of DM2, HTN, RA presenting for follow up of DM2.  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 106 this morning. He is taking Glucophage XR 500mg  2 tablets BID and Novolin 70/30 25u BID.  Plan: -Continue Glucophage XR 500mg  2 tablets BID and Novolin 70/30 25u BID. -Follow up in 2 months for recheck of A1c -Consider endocrine referral   Essential hypertension Assessment: BP today 129/82. Well controlled.  Plan: Continue lisinopril 2.5mg  daily.  Diabetic retinopathy, nonproliferative, moderate Assessment: Recent retinal camera with Moderate w/CSME, 01-08-1970 Moderate  Plan: Referral to retinal specialist.  Diabetic neuropathy, painful Assessment: Worsening diabetic neuropathy for the past 4  months.  Plan: -Gabapentin 300mg  TID.

## 2015-12-23 NOTE — Assessment & Plan Note (Signed)
Assessment: BP today 129/82. Well controlled.  Plan: Continue lisinopril 2.5mg  daily.

## 2015-12-24 NOTE — Progress Notes (Signed)
Medicine attending: Medical history, presenting problems, physical findings, and medications, reviewed with resident physician Dr Griffin Basil on the day of the patient visit and I concur with her evaluation and management plan which we discussed together.

## 2016-01-10 ENCOUNTER — Ambulatory Visit: Payer: Self-pay

## 2016-01-14 ENCOUNTER — Telehealth: Payer: Self-pay | Admitting: Pulmonary Disease

## 2016-01-14 NOTE — Telephone Encounter (Signed)
APPT. REMINDER CALL, LMTCB IF HE NEEDS TO CANCEL °

## 2016-01-17 ENCOUNTER — Ambulatory Visit: Payer: Self-pay

## 2016-01-27 ENCOUNTER — Ambulatory Visit: Payer: Self-pay

## 2016-02-02 ENCOUNTER — Telehealth: Payer: Self-pay | Admitting: Pulmonary Disease

## 2016-02-02 NOTE — Telephone Encounter (Signed)
APPT. REMINDER CALL, LMTCB °

## 2016-02-03 ENCOUNTER — Ambulatory Visit: Payer: Self-pay

## 2016-02-18 ENCOUNTER — Telehealth: Payer: Self-pay

## 2016-02-18 NOTE — Telephone Encounter (Signed)
Last seen 12/2015

## 2016-02-18 NOTE — Telephone Encounter (Signed)
Can yal help him get in with Dr. Isabella Bowens specifically pleasE?

## 2016-03-06 ENCOUNTER — Encounter: Payer: Self-pay | Admitting: Pulmonary Disease

## 2016-03-06 ENCOUNTER — Ambulatory Visit (INDEPENDENT_AMBULATORY_CARE_PROVIDER_SITE_OTHER): Payer: Self-pay | Admitting: Pulmonary Disease

## 2016-03-06 VITALS — BP 141/85 | HR 81 | Temp 97.7°F | Resp 18 | Ht 62.5 in | Wt 134.3 lb

## 2016-03-06 DIAGNOSIS — E113399 Type 2 diabetes mellitus with moderate nonproliferative diabetic retinopathy without macular edema, unspecified eye: Secondary | ICD-10-CM

## 2016-03-06 DIAGNOSIS — E1165 Type 2 diabetes mellitus with hyperglycemia: Secondary | ICD-10-CM

## 2016-03-06 DIAGNOSIS — IMO0002 Reserved for concepts with insufficient information to code with codable children: Secondary | ICD-10-CM

## 2016-03-06 DIAGNOSIS — Z794 Long term (current) use of insulin: Secondary | ICD-10-CM

## 2016-03-06 DIAGNOSIS — I10 Essential (primary) hypertension: Secondary | ICD-10-CM

## 2016-03-06 DIAGNOSIS — E1140 Type 2 diabetes mellitus with diabetic neuropathy, unspecified: Secondary | ICD-10-CM

## 2016-03-06 LAB — POCT GLYCOSYLATED HEMOGLOBIN (HGB A1C): HEMOGLOBIN A1C: 9.1

## 2016-03-06 LAB — GLUCOSE, CAPILLARY: GLUCOSE-CAPILLARY: 202 mg/dL — AB (ref 65–99)

## 2016-03-06 MED ORDER — INSULIN SYRINGES (DISPOSABLE) U-100 1 ML MISC: 1.0000 | Freq: Two times a day (BID) | Status: DC

## 2016-03-06 MED ORDER — "INSULIN SYRINGE-NEEDLE U-100 31G X 3/8"" 0.5 ML MISC": 1.0000 [IU] | Freq: Two times a day (BID) | Status: DC

## 2016-03-06 MED ORDER — LISINOPRIL 2.5 MG PO TABS: 2.5000 mg | ORAL_TABLET | Freq: Every day | ORAL | Status: DC

## 2016-03-06 MED ORDER — METFORMIN HCL ER 500 MG PO TB24: 1500.0000 mg | ORAL_TABLET | Freq: Every day | ORAL | Status: DC

## 2016-03-06 MED ORDER — AMITRIPTYLINE HCL 25 MG PO TABS: 25.0000 mg | ORAL_TABLET | Freq: Every day | ORAL | Status: DC

## 2016-03-06 MED ORDER — LANCETS MISC: Status: DC

## 2016-03-06 NOTE — Progress Notes (Signed)
Case discussed with Dr. Krall soon after the resident saw the patient.  We reviewed the resident's history and exam and pertinent patient test results.  I agree with the assessment, diagnosis, and plan of care documented in the resident's note. 

## 2016-03-06 NOTE — Assessment & Plan Note (Signed)
Assessment: No improvement of diabetic neuropathy with gabapentin  Plan: Continue to work on diabetes control Start amitriptyline 25 mg daily at bedtime Recommended capsaicin cream OTC

## 2016-03-06 NOTE — Assessment & Plan Note (Signed)
Lab Results  Component Value Date   HGBA1C 9.1 03/06/2016   HGBA1C 11.4 12/09/2015   HGBA1C 11.6 02/26/2015     Assessment: Diabetes control: Uncontrolled Progress toward A1C goal:  Improved Comments: He has only been taking 500 mg of metformin XR twice a day.  Plan: Increase metformin to 750 mg daily Continue Novolin 70/30 25 units twice a day He may have type 1 diabetes, but unable to refer to endocrinology at this time due to insurance

## 2016-03-06 NOTE — Progress Notes (Signed)
Subjective:    Patient ID: Jason [name], male    DOB: 1976/10/05, 40 y.o.   MRN: [id number]  HPI Jason [name] is a 40 year old man with history of diabetes, HTN, RA presenting for follow up of diabetes.  Denies episodes of hypoglycemia. He did not bring his meter.  He is still having bilateral foot pain. The pain is a burning sensation with tingling and numbness. The gabapentin did not help relief the pain.  Review of Systems Constitutional: no fevers/chills Respiratory: no shortness of breath Cardiovascular: no chest pain GI: no nausea/vomiting  Past Medical History  Diagnosis Date  . DKA (diabetic ketoacidoses) (HCC)     hospitalized in 03/10/08  . Diabetes mellitus     diagnosed in april when admitted to [facility] with blood glucose of 581. HbA1C was 9.9  . Rheumatoid arthritis(714.0)     managed by Dr. Pollyann Savoy. sronegative. Dr. 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  . Latent tuberculosis infection     PPD test measured 12 mm on 05/25/08. CXR on 06/03/08 showed no active disease or adenopathy. Started to take isoniazid 300 mg daily. on 06/11/08 and will  take medication  along with Vit  B6 for 9 months.    Current Outpatient Prescriptions on File Prior to Visit  Medication Sig Dispense Refill  . albuterol (PROVENTIL HFA) 108 (90 BASE) MCG/ACT inhaler Inhale 2 puffs into the lungs every 6 (six) hours as needed for wheezing. 8.5 g 0  . Blood Glucose Monitoring Suppl (AGAMATRIX PRESTO PRO METER) DEVI 1 Units by Does not apply route 3 (three) times daily before meals. 1 Device 0  . glucose blood test strip Use as instructed 100 each 11  . insulin NPH-regular Human (NOVOLIN 70/30) (70-30) 100 UNIT/ML injection Inject 25 Units into the skin 2 (two) times daily. 10 mL 6  . pravastatin (PRAVACHOL) 40 MG tablet Take 1 tablet (40 mg total) by mouth daily. 30 tablet 6   No current facility-administered medications  on file prior to visit.   Objective:   Physical Exam Blood pressure 141/85, pulse 81, temperature 97.7 F (36.5 C), temperature source Oral, resp. rate 18, height 5' 2.5" (1.588 m), weight 134 lb 4.8 oz (60.918 kg), SpO2 100 %. General Apperance: NAD HEENT: Normocephalic, atraumatic, anicteric sclera Neck: Supple, trachea midline Lungs: Clear to auscultation bilaterally. No wheezes, rhonchi or rales. Breathing comfortably Heart: Regular rate and rhythm, no murmur/rub/gallop Abdomen: Soft, nontender, nondistended, no rebound/guarding Extremities: Warm and well perfused, no edema Skin: No rashes or lesions Neurologic: Alert and interactive. No gross deficits.  Assessment & Plan:  40 year old man with history of DM2, HTN, RA presenting for follow up of DM2.  Essential hypertension Assessment: BP today 141/85. Previously well controlled on lisinopril. He has not been taking lisinopril because he did not know he had refills.  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  Plan: Increase metformin to 750 mg daily Continue Novolin 70/30 25 units twice a day He may have type 1 diabetes, but unable to refer to endocrinology at this time due to  insurance     Diabetic neuropathy, painful Assessment: No improvement of diabetic neuropathy with gabapentin  Plan: Continue to work on diabetes control Start amitriptyline 25 mg daily at bedtime Recommended capsaicin cream OTC

## 2016-03-06 NOTE — Assessment & Plan Note (Signed)
Assessment: BP today 141/85. Previously well controlled on lisinopril. He has not been taking lisinopril because he did not know he had refills.  Plan: Restart lisinopril 2.5 mg daily

## 2016-03-06 NOTE — Patient Instructions (Signed)
METFORMIN: Toma TRES pastillas en total cada da. Usted puede probar crema de CAPSAICIN (capsaicina) por el dolor en tus pies. Puedue comprar esta en la farmacia.

## 2016-06-14 ENCOUNTER — Encounter: Payer: Self-pay | Admitting: Licensed Clinical Social Worker

## 2016-06-14 NOTE — Progress Notes (Signed)
Jason [name] is current with his [facility] card until 07/2016.  Per request from nursing, CSW mailed current bus pass [facility] application to patient with return envelope.

## 2016-08-17 ENCOUNTER — Telehealth: Payer: Self-pay | Admitting: Pulmonary Disease

## 2016-08-17 ENCOUNTER — Ambulatory Visit: Payer: Self-pay

## 2016-08-17 NOTE — Telephone Encounter (Signed)
PT NO SHOW ON 08/17/16, CALLED TO RESCHEDULE, LMTCB

## 2016-08-23 ENCOUNTER — Telehealth: Payer: Self-pay | Admitting: Pulmonary Disease

## 2016-08-23 NOTE — Telephone Encounter (Signed)
APT. REMINDER CALL, LMTCB °

## 2016-08-24 ENCOUNTER — Encounter: Payer: Self-pay | Admitting: Pulmonary Disease

## 2016-08-25 ENCOUNTER — Encounter: Payer: Self-pay | Admitting: Pulmonary Disease

## 2017-06-27 ENCOUNTER — Ambulatory Visit: Payer: Self-pay

## 2017-07-02 ENCOUNTER — Ambulatory Visit (INDEPENDENT_AMBULATORY_CARE_PROVIDER_SITE_OTHER): Payer: Self-pay | Admitting: Internal Medicine

## 2017-07-02 VITALS — BP 123/78 | HR 93 | Temp 98.2°F | Ht 62.4 in | Wt 130.2 lb

## 2017-07-02 DIAGNOSIS — E113399 Type 2 diabetes mellitus with moderate nonproliferative diabetic retinopathy without macular edema, unspecified eye: Secondary | ICD-10-CM

## 2017-07-02 DIAGNOSIS — Z794 Long term (current) use of insulin: Secondary | ICD-10-CM

## 2017-07-02 DIAGNOSIS — I10 Essential (primary) hypertension: Secondary | ICD-10-CM

## 2017-07-02 DIAGNOSIS — E1165 Type 2 diabetes mellitus with hyperglycemia: Secondary | ICD-10-CM

## 2017-07-02 DIAGNOSIS — E785 Hyperlipidemia, unspecified: Secondary | ICD-10-CM

## 2017-07-02 DIAGNOSIS — E1140 Type 2 diabetes mellitus with diabetic neuropathy, unspecified: Secondary | ICD-10-CM

## 2017-07-02 DIAGNOSIS — E1142 Type 2 diabetes mellitus with diabetic polyneuropathy: Secondary | ICD-10-CM

## 2017-07-02 LAB — POCT GLYCOSYLATED HEMOGLOBIN (HGB A1C): Hemoglobin A1C: 12.2

## 2017-07-02 LAB — GLUCOSE, CAPILLARY: Glucose-Capillary: 366 mg/dL — ABNORMAL HIGH (ref 65–99)

## 2017-07-02 MED ORDER — GABAPENTIN 400 MG PO CAPS: 400.0000 mg | ORAL_CAPSULE | Freq: Three times a day (TID) | ORAL | 2 refills | Status: DC

## 2017-07-02 MED ORDER — LISINOPRIL 2.5 MG PO TABS: 2.5000 mg | ORAL_TABLET | Freq: Every day | ORAL | 2 refills | Status: DC

## 2017-07-02 MED ORDER — GLUCOSE BLOOD VI STRP: ORAL_STRIP | 6 refills | Status: DC

## 2017-07-02 MED ORDER — INSULIN NPH ISOPHANE & REGULAR (70-30) 100 UNIT/ML ~~LOC~~ SUSP: 15.0000 [IU] | Freq: Two times a day (BID) | SUBCUTANEOUS | 2 refills | Status: DC

## 2017-07-02 MED ORDER — METFORMIN HCL 1000 MG PO TABS: 1000.0000 mg | ORAL_TABLET | Freq: Two times a day (BID) | ORAL | 2 refills | Status: DC

## 2017-07-02 MED ORDER — LANCETS MISC: 6 refills | Status: DC

## 2017-07-02 MED ORDER — INSULIN SYRINGES (DISPOSABLE) U-100 1 ML MISC: 6 refills | Status: DC

## 2017-07-02 MED FILL — GABAPENTIN 400 MG CAPSULE: 400 | 30 days supply | Qty: 90 | Fill #0

## 2017-07-02 MED FILL — metFORMIN HCL 1000 MG TABS: 1000 | 30 days supply | Qty: 60 | Fill #0

## 2017-07-02 MED FILL — TRUE METRIX GLUCOSE TEST ST: 30 days supply | Qty: 100 | Fill #0

## 2017-07-02 MED FILL — TRUEplus LANCETS 30G MISC: 90 days supply | Qty: 100 | Fill #0

## 2017-07-02 MED FILL — HUMULIN 70/30 VIAL: (70-30) 100 | 30 days supply | Qty: 10 | Fill #0

## 2017-07-02 MED FILL — ULTICARE SYR 0.3 ML 30GX5/1: 30G X 5/16" | 30 days supply | Qty: 60 | Fill #0

## 2017-07-02 MED FILL — LISINOPRIL 2.5 MG TABLET: 2.5 | 30 days supply | Qty: 30 | Fill #0

## 2017-07-02 NOTE — Assessment & Plan Note (Addendum)
Assessment Patient has not taken any medications for the past 8 months. He was previously on pravastatin 40 mg daily.  Plan -The focus of today's visit was to re-start patient's diabetes medications and work toward achieving better glycemic control. -Please check his lipid panel at the next visit and prescribe a statin based on the results.

## 2017-07-02 NOTE — Assessment & Plan Note (Signed)
Assessment A1c 12.2 and CBG 366 at this visit. A1c was 9.1 in April 2017. Patient has not taken any medications for the past 8 months. Previous medication regimen included metformin XR 1500 mg daily and NovoLog 70/30 25 units twice daily. Patient reports taking metformin as prescribed and NovoLog 70/30 25 units in the morning and 15 units at night previously. He is currently uninsured and is planning to speak to [facility] about getting an orange card.  Plan -Metformin 1000 mg twice daily -NovoLog 70/30 15 units twice daily -Testing supplies have been refilled. All medications have been sent to the [facility] outpatient pharmacy.  -Return to the clinic in 4 weeks with meter -Patient has diabetic retinopathy and will need a referral to ophthalmology once he has an orange card.

## 2017-07-02 NOTE — Progress Notes (Signed)
Internal Medicine Clinic Attending  Case discussed with Dr. Rathoreat the time of the visit. We reviewed the resident's history and exam and pertinent patient test results. I agree with the assessment, diagnosis, and plan of care documented in the resident's note.  

## 2017-07-02 NOTE — Assessment & Plan Note (Addendum)
Assessment Blood pressure is currently well controlled despite not taking any medications for the past 8 months. It is 123/78 at this visit. Patient does have a history of proteinuria and was previously on lisinopril 2.5 mg daily.  Plan -Resume lisinopril -Check BMP to assess renal function -Work towards achieving better glycemic control. Patient will need to have his urine microalbumin to creatinine ratio checked in the near future.  Addendum 07/04/2017: Cr 0.6 and GFR 123. Called the patient using interpretor services on 07/03/2017. Stressed the importance of taking his medications regularly. His blood pressure is currently well controlled. Explained to him that we have to achieve better glycemic control to prevent decline in renal function.

## 2017-07-02 NOTE — Patient Instructions (Addendum)
[name] it was nice meeting you today.  -For diabetes: NovoLog 70/30: Inject 15 units into skin twice daily Take metformin 1000 mg 1 tablet twice daily Check your blood sugar 3 times a day before meals.  -Take lisinopril 2.5 mg once daily  -Take gabapentin 400 mg 1 capsule 3 times a day  -Please speak to [employer] about getting an orange card.  -Return to the clinic in 4 weeks with your meter and medications.

## 2017-07-02 NOTE — Progress Notes (Signed)
   CC: Patient is here to get medication refills. Diabetes, diabetic peripheral neuropathy, hypertension, and hyperlipidemia were discussed during this visit.  HPI:  [name] is a 41 y.o. male with a past medical history of conditions listed below presenting to the clinic requesting medication refills. Diabetes, diabetic peripheral neuropathy, hypertension, and hyperlipidemia were discussed during this visit. Please see problem based charting for the status of the patient's current and chronic medical conditions.   Past Medical History:  Diagnosis Date  . Diabetes mellitus    diagnosed in april when admitted to [facility] with blood glucose of 581. HbA1C was 9.9  . DKA (diabetic ketoacidoses) (HCC)    hospitalized in 03/10/08  . Latent tuberculosis infection    PPD test measured 12 mm on 05/25/08. CXR on 06/03/08 showed no active disease or adenopathy. Started to take isoniazid 300 mg daily. on 06/11/08 and will  take medication  along with Vit  B6 for 9 months.  . Rheumatoid arthritis(714.0)    managed by Jason [name]. sronegative. Jason [name] wanted to start him on Methotrxate but patient was found to be  PPD positive.   Review of Systems: Pertinent positives mentioned in HPI. Remainder of all ROS negative.   Physical Exam:  Vitals:   07/02/17 1104  BP: 123/78  Pulse: 93  Temp: 98.2 F (36.8 C)  TempSrc: Oral  SpO2: 97%  Weight: 130 lb 3.2 oz (59.1 kg)  Height: 5' 2.4" (1.585 m)   Physical Exam  Constitutional: He is oriented to person, place, and time. He appears well-developed and well-nourished. No distress.  HENT:  Head: Normocephalic and atraumatic.  Eyes: Right eye exhibits no discharge. Left eye exhibits no discharge.  Neck: Normal range of motion.  Pulmonary/Chest: No respiratory distress.  Abdominal: He exhibits no distension.  Musculoskeletal: He exhibits no edema or deformity.  Right knee: Decreased passive range of motion. No erythema or  increased warmth noted.  Neurological: He is alert and oriented to person, place, and time.  Skin: Skin is warm and dry.  Psychiatric: He has a normal mood and affect. His behavior is normal.    Assessment & Plan:   See Encounters Tab for problem based charting.  Patient discussed with Jason [name]

## 2017-07-02 NOTE — Assessment & Plan Note (Signed)
Assessment He was previously on gabapentin 300 mg 3 times a day which was ineffective and patient was switched to amitriptyline 25 units at bedtime. States amitriptyline did not help him at all despite taking it consistently in the past. He has not taken any medications for the past 8 months now. Complaining of increased needle-like pain in his feet which wakes him up at night. His neuropathy has worsened in the setting of uncontrolled diabetes and medication nonadherence.  Plan -Discontinue amitriptyline -Start gabapentin 400 mg 3 times a day. Dose may be uptitrated at future visits based on patient response. -Work toward achieving better glycemic control.

## 2017-07-03 LAB — BMP8+ANION GAP
Anion Gap: 19 mmol/L — ABNORMAL HIGH (ref 10.0–18.0)
BUN/Creatinine Ratio: 25 — ABNORMAL HIGH (ref 9–20)
BUN: 16 mg/dL (ref 6–20)
CO2: 20 mmol/L (ref 20–29)
Calcium: 9.4 mg/dL (ref 8.7–10.2)
Chloride: 96 mmol/L (ref 96–106)
Creatinine, Ser: 0.65 mg/dL — ABNORMAL LOW (ref 0.76–1.27)
GFR calc Af Amer: 143 mL/min/1.73
GFR calc non Af Amer: 123 mL/min/1.73
Glucose: 345 mg/dL — ABNORMAL HIGH (ref 65–99)
Potassium: 4.6 mmol/L (ref 3.5–5.2)
Sodium: 135 mmol/L (ref 134–144)

## 2017-12-20 ENCOUNTER — Ambulatory Visit: Payer: Self-pay | Admitting: Internal Medicine

## 2017-12-20 ENCOUNTER — Other Ambulatory Visit: Payer: Self-pay

## 2017-12-20 VITALS — BP 145/92 | HR 94 | Temp 98.5°F | Ht 62.4 in | Wt 131.5 lb

## 2017-12-20 DIAGNOSIS — Z23 Encounter for immunization: Secondary | ICD-10-CM

## 2017-12-20 DIAGNOSIS — M069 Rheumatoid arthritis, unspecified: Secondary | ICD-10-CM

## 2017-12-20 DIAGNOSIS — E1165 Type 2 diabetes mellitus with hyperglycemia: Secondary | ICD-10-CM

## 2017-12-20 DIAGNOSIS — I10 Essential (primary) hypertension: Secondary | ICD-10-CM

## 2017-12-20 DIAGNOSIS — IMO0002 Reserved for concepts with insufficient information to code with codable children: Secondary | ICD-10-CM

## 2017-12-20 DIAGNOSIS — E113399 Type 2 diabetes mellitus with moderate nonproliferative diabetic retinopathy without macular edema, unspecified eye: Secondary | ICD-10-CM

## 2017-12-20 DIAGNOSIS — E118 Type 2 diabetes mellitus with unspecified complications: Secondary | ICD-10-CM

## 2017-12-20 DIAGNOSIS — R21 Rash and other nonspecific skin eruption: Secondary | ICD-10-CM

## 2017-12-20 DIAGNOSIS — B354 Tinea corporis: Secondary | ICD-10-CM | POA: Insufficient documentation

## 2017-12-20 DIAGNOSIS — Z794 Long term (current) use of insulin: Secondary | ICD-10-CM

## 2017-12-20 LAB — GLUCOSE, CAPILLARY: GLUCOSE-CAPILLARY: 251 mg/dL — AB (ref 65–99)

## 2017-12-20 MED ORDER — METFORMIN HCL 1000 MG PO TABS: 1000.0000 mg | ORAL_TABLET | Freq: Two times a day (BID) | ORAL | 2 refills | Status: DC

## 2017-12-20 MED ORDER — LANCETS MISC: 6 refills | Status: DC

## 2017-12-20 MED ORDER — BETAMETHASONE DIPROPIONATE 0.05 % EX CREA: TOPICAL_CREAM | Freq: Two times a day (BID) | CUTANEOUS | 2 refills | Status: DC

## 2017-12-20 MED ORDER — GLUCOSE BLOOD VI STRP: ORAL_STRIP | 6 refills | Status: DC

## 2017-12-20 MED ORDER — INSULIN NPH ISOPHANE & REGULAR (70-30) 100 UNIT/ML ~~LOC~~ SUSP: 15.0000 [IU] | Freq: Two times a day (BID) | SUBCUTANEOUS | 2 refills | Status: DC

## 2017-12-20 MED ORDER — INSULIN SYRINGES (DISPOSABLE) U-100 1 ML MISC: 6 refills | Status: DC

## 2017-12-20 MED ORDER — LISINOPRIL 2.5 MG PO TABS: 2.5000 mg | ORAL_TABLET | Freq: Every day | ORAL | 2 refills | Status: DC

## 2017-12-20 MED FILL — BETAMETHASONE DP 0.05% CRM: 0.05 | 20 days supply | Qty: 45 | Fill #0

## 2017-12-20 MED FILL — metFORMIN HCL 1000 MG TABS: 1000 | 30 days supply | Qty: 60 | Fill #0

## 2017-12-20 MED FILL — NOVOLIN 70/30 100 UNITS/ML: (70-30) 100 | 30 days supply | Qty: 10 | Fill #0

## 2017-12-20 MED FILL — LISINOPRIL 2.5 MG TABLET: 2.5 | 30 days supply | Qty: 30 | Fill #0

## 2017-12-20 MED FILL — TRUEplus LANCETS 30G MISC: 30 days supply | Qty: 100 | Fill #0

## 2017-12-20 MED FILL — ULTICARE SYR 0.3 ML 30GX5/1: 30G X 5/16" | 30 days supply | Qty: 60 | Fill #0

## 2017-12-20 NOTE — Patient Instructions (Signed)
For your itching, start using lotion daily to twice daily all over your body.  For the rash on your legs, place betamethasone cream twice daily.  Please come back in 1 month to check on your diabetes and blood pressure.

## 2017-12-20 NOTE — Assessment & Plan Note (Signed)
Patient states he has not been taking antihypertensive medication.  He denies chest pain, shortness of breath, headaches, vision or hearing changes.  Plan Restart lisinopril 2.5 mg daily Follow-up in 2 weeks

## 2017-12-20 NOTE — Assessment & Plan Note (Signed)
Patient endorses 52-month history of pruritic rash on left lower extremity with eventual development of symptoms on the right lower extremity as well.  He states that the rash initially began as a small lesion of vesicles that eventually enlarged into a coin shaped lesion.  He endorses diffuse pruritus on the rest of his body.  He endorses feeling feverish when pruritus is exacerbated however he denies checking his temperature.  He endorses diffuse joint pain.  Denies appetite changes, weight changes, GI symptoms, headaches, family or personal history of psoriasis, eczema, or autoimmune diseases.  He denies history of STI's or possible current STI, recent travel.  On exam patient has 1 irregularly bordered coin shaped, plaque with mild skin flaking on the left lower extremity, and 2 similar but smaller lesions on the right lower extremity.  No other skin lesions on back, abdomen, scalp, arms, face.  He does presumably have a diagnosis of seronegative rheumatoid arthritis, which what might make it more likely that rashes psoriatic.  He also has uncontrolled type 2 diabetes which has been associated with high risk of lichen planus.  Plan --We will treat with betamethasone 0.05 mg twice daily --Advised patient to fill out paperwork for orange card; this will make it easier and more affordable to refer to dermatology if needed, or back to rheumatology (has not followed up in over 5 years per patient)

## 2017-12-20 NOTE — Progress Notes (Signed)
   CC: Rash  HPI:  [name] is a 42 y.o. with a PMH of uncontrolled type 2 diabetes, hypertension, rheumatoid arthritis presenting to clinic for evaluation of a rash  Lower extremity rash Patient endorses 73-month history of pruritic rash on left lower extremity with eventual development of symptoms on the right lower extremity as well.  He states that the rash initially began as a small lesion of vesicles that eventually enlarged into a coin shaped lesion.  He endorses diffuse pruritus on the rest of his body.  He endorses feeling feverish when pruritus is exacerbated however he denies checking his temperature.  He endorses diffuse joint pain.  Denies appetite changes, weight changes, GI symptoms, headaches, family or personal history of psoriasis, eczema, or autoimmune diseases.  He denies history of STI's or possible current STI, recent travel.  Please see problem based Assessment and Plan for status of patients chronic conditions.  Past Medical History:  Diagnosis Date  . Diabetes mellitus    diagnosed in april when admitted to [facility] with blood glucose of 581. HbA1C was 9.9  . DKA (diabetic ketoacidoses) (HCC)    hospitalized in 03/10/08  . Latent tuberculosis infection    PPD test measured 12 mm on 05/25/08. CXR on 06/03/08 showed no active disease or adenopathy. Started to take isoniazid 300 mg daily. on 06/11/08 and will  take medication  along with Vit  B6 for 9 months.  . Rheumatoid arthritis(714.0)    managed by Dr. Pollyann Savoy. sronegative. Dr. 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    Review of Systems:   ROS per HPI  Physical Exam:  Vitals:   12/20/17 1110  BP: (!) 145/92  Pulse: 94  Temp: 98.5 F (36.9 C)  TempSrc: Oral  SpO2: 99%  Weight: 131 lb 8 oz (59.6 kg)  Height: 5' 2.4" (1.585 m)   GENERAL- alert, co-operative, appears as stated age, not in any distress. CARDIAC- RRR, no murmurs, rubs or  gallops. RESP- Moving equal volumes of air, and clear to auscultation bilaterally, no wheezes or crackles. ABDOMEN- Soft, nontender, bowel sounds present. NEURO- No obvious Cr N abnormality. EXTREMITIES- pulse 2+, symmetric.  Left lower extremity with about 2.5 cm in diameter circular plaque with violaceous discoloration, and skin flaking.  Right lower extremity with 2 smaller similar lesions. PSYCH- Normal mood and affect, appropriate thought content and speech.  Media Information     Assessment & Plan:   See Encounters Tab for problem based charting.   Patient discussed with Dr. Argentina Ponder, MD Internal Medicine PGY2

## 2017-12-20 NOTE — Assessment & Plan Note (Signed)
Patient states that he has been out of metformin and insulin for about 2-3 weeks now.  He has not reached out to anybody for refills.  He denies nausea, abdominal pain, vomiting, diarrhea, chest pain, blurry vision, dizziness.  CBG today 251.  Plan --Refilled metformin thousand milligrams twice daily --Refilled Novolin 70/30 15 units twice daily --Refilled testing supplies --Follow-up in 2 weeks with glucometer

## 2017-12-21 NOTE — Progress Notes (Signed)
Medicine attending: Medical history, presenting problems, physical findings, and medications, reviewed with resident physician Dr Nyra Market on the day of the patient visit and I concur with her evaluation and management plan. Scaly, circular, dermatitis on both legs. Generalized pruritis. Likely plaque form of psoriasis. We will prescribe topical steroids; atarax for itching. Derm referral if no improvement.

## 2018-01-04 ENCOUNTER — Encounter: Payer: Self-pay | Admitting: Internal Medicine

## 2018-01-04 ENCOUNTER — Ambulatory Visit: Payer: Self-pay

## 2018-06-07 ENCOUNTER — Ambulatory Visit: Payer: Self-pay | Admitting: Internal Medicine

## 2018-06-07 ENCOUNTER — Encounter: Payer: Self-pay | Admitting: Internal Medicine

## 2018-06-07 ENCOUNTER — Encounter (INDEPENDENT_AMBULATORY_CARE_PROVIDER_SITE_OTHER): Payer: Self-pay

## 2018-06-07 ENCOUNTER — Other Ambulatory Visit: Payer: Self-pay

## 2018-06-07 VITALS — BP 126/78 | HR 103 | Temp 98.8°F | Ht 62.4 in | Wt 130.8 lb

## 2018-06-07 DIAGNOSIS — M0600 Rheumatoid arthritis without rheumatoid factor, unspecified site: Secondary | ICD-10-CM

## 2018-06-07 DIAGNOSIS — E1142 Type 2 diabetes mellitus with diabetic polyneuropathy: Secondary | ICD-10-CM

## 2018-06-07 DIAGNOSIS — E113399 Type 2 diabetes mellitus with moderate nonproliferative diabetic retinopathy without macular edema, unspecified eye: Secondary | ICD-10-CM

## 2018-06-07 DIAGNOSIS — I10 Essential (primary) hypertension: Secondary | ICD-10-CM

## 2018-06-07 DIAGNOSIS — R809 Proteinuria, unspecified: Secondary | ICD-10-CM

## 2018-06-07 DIAGNOSIS — M0609 Rheumatoid arthritis without rheumatoid factor, multiple sites: Secondary | ICD-10-CM

## 2018-06-07 DIAGNOSIS — R21 Rash and other nonspecific skin eruption: Secondary | ICD-10-CM

## 2018-06-07 DIAGNOSIS — E1169 Type 2 diabetes mellitus with other specified complication: Secondary | ICD-10-CM

## 2018-06-07 DIAGNOSIS — Z79899 Other long term (current) drug therapy: Secondary | ICD-10-CM

## 2018-06-07 DIAGNOSIS — E1140 Type 2 diabetes mellitus with diabetic neuropathy, unspecified: Secondary | ICD-10-CM

## 2018-06-07 DIAGNOSIS — IMO0002 Reserved for concepts with insufficient information to code with codable children: Secondary | ICD-10-CM

## 2018-06-07 DIAGNOSIS — Z794 Long term (current) use of insulin: Secondary | ICD-10-CM

## 2018-06-07 DIAGNOSIS — E1165 Type 2 diabetes mellitus with hyperglycemia: Secondary | ICD-10-CM

## 2018-06-07 LAB — POCT GLYCOSYLATED HEMOGLOBIN (HGB A1C): Hemoglobin A1C: 12.9 % — AB (ref 4.0–5.6)

## 2018-06-07 LAB — GLUCOSE, CAPILLARY: GLUCOSE-CAPILLARY: 285 mg/dL — AB (ref 70–99)

## 2018-06-07 MED ORDER — INSULIN NPH ISOPHANE & REGULAR (70-30) 100 UNIT/ML ~~LOC~~ SUSP: 25.0000 [IU] | Freq: Two times a day (BID) | SUBCUTANEOUS | 6 refills | Status: DC

## 2018-06-07 MED ORDER — GABAPENTIN 300 MG PO CAPS: 300.0000 mg | ORAL_CAPSULE | Freq: Two times a day (BID) | ORAL | 2 refills | Status: DC

## 2018-06-07 MED ORDER — MICONAZOLE NITRATE 2 % EX CREA: TOPICAL_CREAM | CUTANEOUS | 0 refills | Status: DC

## 2018-06-07 MED ORDER — METFORMIN HCL 1000 MG PO TABS: 1000.0000 mg | ORAL_TABLET | Freq: Two times a day (BID) | ORAL | 2 refills | Status: DC

## 2018-06-07 MED ORDER — LISINOPRIL 2.5 MG PO TABS: 2.5000 mg | ORAL_TABLET | Freq: Every day | ORAL | 2 refills | Status: DC

## 2018-06-07 MED ORDER — GLUCOSE BLOOD VI STRP: ORAL_STRIP | 6 refills | Status: DC

## 2018-06-07 MED FILL — MICROLET LANCETS MISC: 30 days supply | Qty: 100 | Fill #0

## 2018-06-07 MED FILL — metFORMIN HCL 1000 MG TABS: 1000 | 30 days supply | Qty: 60 | Fill #0

## 2018-06-07 MED FILL — GABAPENTIN 300 MG CAPSULE: 300 | 30 days supply | Qty: 60 | Fill #0

## 2018-06-07 MED FILL — CONTOUR NEXT METER: W/DEVICE | 30 days supply | Qty: 1 | Fill #0

## 2018-06-07 MED FILL — CONTOUR NEXT STRIPS: 30 days supply | Qty: 100 | Fill #0

## 2018-06-07 MED FILL — NOVOLIN 70/30 100 UNITS/ML: (70-30) 100 | 20 days supply | Qty: 10 | Fill #0

## 2018-06-07 MED FILL — LISINOPRIL 2.5 MG TABLET: 2.5 | 30 days supply | Qty: 30 | Fill #0

## 2018-06-07 NOTE — Assessment & Plan Note (Signed)
T2DM, uncontrolled: This is complicated by retinopathy and peripheral neuropathy. He takes Novolin 70/30 25 units BID and metformin 1000 mg BID. He was last seen in [facility] in 11/2017 at which time he had been out of his insulin and metformin for several weeks. Last A1c 12.2 on 06/2017. He presents today for follow up and reports running out of insulin 2 months ago. He reports urinary urgency and frequency as well as increased thirst.  He usually monitors his blood glucose at home but ran out of strips for the meter as well. He did not bring his meter today. His A1c today 12.9, slightly worsened. He is also experiencing peripheral neuropathy as he ran out of his gabapentin several months ago. Refilled insulin and metformin at stated doses today as well as gabapentin.  Discussed with patient he can call clinic for refills without appointment needed. Also discussed long-term complications of uncontrolled diabetes.  He verbalized understanding.  Foot exam performed today.  Will refer to eye exam after he received his orange card (has appt with Chauncey Reading 8/23). BMP and urine microalbumin ordered. Lisinopril 2.5 mg refilled for proteinuria.

## 2018-06-07 NOTE — Patient Instructions (Signed)
Seor Ardyth Harps,   Le envie rellenado para su insulin, metformina y gabapentina a la farmacia de [place]. Por favor recuerde llamar a la clinica cuando se le este acabando la insulina. No tiene que sacar cita para un rellenado.   Tambien le envie un rellenado para lisinopril. Continue tomandose una tableta [employer].   Para su salpullido le recete una crema que se llama miconazole. Se la va untar en el salpullido 2 veces al dis.   Saque una cita de seguimiento conmigo en 3 meses.   Cualquier duda o pregunta que tenga por favor llame a la clinica.   - Dra. Evelene Croon

## 2018-06-07 NOTE — Assessment & Plan Note (Signed)
Rash: Patient was seen in [facility] with LLE rash (refer to 1/31 note for picture) that was thought to be a psoriatic lesion. He was prescribed betamethasone 0.05% cream but reports very little improvement. He bought an OTC cream at a supermarket that has been helping with the itching, but he is unable to recall what it is. On exam, his primary lesion on the LLE has resolved, there is only a hyperpigmented area at the site of the lesion. He now has well-demarcated, scaly lesions with central clearing at R ankle and R shin that appears fungal in nature. Will try miconazole 2% cream BID. Patient advised to call if no improvement in rash.

## 2018-06-07 NOTE — Assessment & Plan Note (Signed)
RA: Used to follow up with Dr. Corliss Skains with plans to start MTX. He was however lost to follow up. He reports morning stiffness and bilateral knee pain and swelling. He would like to be referred to rheumatology again. He is in the process of applying for the [facility], will refer to rheumatology once he gets his card.

## 2018-06-07 NOTE — Assessment & Plan Note (Addendum)
HTN: Of note, patient does not have a history of essential HTN. He does take lisinopril 2.5 mg QD for microalbuminuria, not for HTN. He ran out of this medication 3-4 months ago. BP today is at goal. Refilled for proteinuria.

## 2018-06-07 NOTE — Progress Notes (Signed)
CC: T2DM, HTN, RA, and rash  follow up   HPI:  [name] is a 42 y.o. male with PMH listed below who presents to clinic for T2DM, HTN, RA, and rash  follow up.   T2DM, uncontrolled: This is complicated by retinopathy and peripheral neuropathy. He takes Novolin 70/30 25 units BID and metformin 1000 mg BID. He was last seen in [facility] in 11/2017 at which time he had been out of his insulin and metformin for several weeks. Last A1c 12.2 on 06/2017. He presents today for follow up and reports running out of insulin 2 months ago. He reports urinary urgency and frequency as well as increased thirst.  He usually monitors his blood glucose at home but ran out of strips for the meter as well. He did not bring his meter today. His A1c today 12.9, slightly worsened. He is also experiencing peripheral neuropathy as he ran out of his gabapentin several months ago. Refilled insulin and metformin at stated doses today as well as gabapentin.  Discussed with patient he can call clinic for refills without appointment needed. Also discussed long-term complications of uncontrolled diabetes.  He verbalized understanding.  Foot exam performed today.  Will refer to eye exam after he received his orange card (has appt with Chauncey Reading 8/23). BMP and urine microalbumin ordered. Lisinopril 2.5 mg refilled for proteinuria. Follow up in 3 months. Asked patient to bring glucose meter.    HTN: Of note, patient does not have a history of essential HTN. He does take lisinopril 2.5 mg QD for microalbuminuria, not for HTN. He ran out of this medication 3-4 months ago. BP today is at goal. Refilled for proteinuria.   RA: Used to follow up with Dr. Corliss Skains with plans to start MTX. He was however lost to follow up. He reports morning stiffness and bilateral knee pain and swelling. He would like to be referred to rheumatology again. He is in the process of applying for the [facility], will refer to rheumatology once he gets his card.    Rash: Patient was seen in [facility] with LLE rash (refer to 1/31 note for picture) that was thought to be a psoriatic lesion. He was prescribed betamethasone 0.05% cream but reports very little improvement. He bought an OTC cream at a supermarket that has been helping with the itching, but he is unable to recall what it is. On exam, his primary lesion on the LLE has resolved, there is only a hyperpigmented area at the site of the lesion. He now has well-demarcated, scaly lesions with central clearing at R ankle and R shin that appears fungal in nature. Will try miconazole 2% cream BID. Patient advised to call if no improvement in rash.   Past Medical History:  Diagnosis Date  . Diabetes mellitus    diagnosed in april when admitted to [facility] with blood glucose of 581. HbA1C was 9.9  . DKA (diabetic ketoacidoses) (HCC)    hospitalized in 03/10/08  . Latent tuberculosis infection    PPD test measured 12 mm on 05/25/08. CXR on 06/03/08 showed no active disease or adenopathy. Started to take isoniazid 300 mg daily. on 06/11/08 and will  take medication  along with Vit  B6 for 9 months.  . Rheumatoid arthritis(714.0)    managed by Dr. Pollyann Savoy. sronegative. Dr. 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   Review of Systems:   Review of Systems  Constitutional:  Negative for chills and fever.  Respiratory: Negative for shortness of breath.   Gastrointestinal: Negative for abdominal pain, nausea and vomiting.  Genitourinary: Positive for frequency and urgency.  Musculoskeletal: Positive for joint pain.  Skin: Positive for itching and rash.  Neurological: Negative for dizziness.    Physical Exam:  Vitals:   06/07/18 1508  BP: 126/78  Pulse: (!) 103  Temp: 98.8 F (37.1 C)  TempSrc: Oral  SpO2: 98%  Weight: 130 lb 12.8 oz (59.3 kg)  Height: 5' 2.4" (1.585 m)    General: Young male, well-nourished, well-developed, in no acute distress CV:   RRR, no mrg  Pulm: CTAB, no increased work of breathing Skin: primary lesion on the LLE has resolved, there is only a hyperpigmented area at the site of the lesion. He now has well-demarcated, scaly lesions with central clearing at R ankle and R shin   Assessment & Plan:   See Encounters Tab for problem based charting.  Patient discussed with Dr. Criselda Peaches

## 2018-06-08 LAB — BMP8+ANION GAP
ANION GAP: 16 mmol/L (ref 10.0–18.0)
BUN / CREAT RATIO: 21 — AB (ref 9–20)
BUN: 12 mg/dL (ref 6–20)
CHLORIDE: 98 mmol/L (ref 96–106)
CO2: 24 mmol/L (ref 20–29)
Calcium: 9.7 mg/dL (ref 8.7–10.2)
Creatinine, Ser: 0.57 mg/dL — ABNORMAL LOW (ref 0.76–1.27)
GFR calc Af Amer: 150 mL/min/{1.73_m2} (ref >59)
GFR calc non Af Amer: 129 mL/min/{1.73_m2} (ref >59)
GLUCOSE: 309 mg/dL — AB (ref 65–99)
Potassium: 4.3 mmol/L (ref 3.5–5.2)
SODIUM: 138 mmol/L (ref 134–144)

## 2018-06-08 LAB — MICROALBUMIN / CREATININE URINE RATIO
CREATININE, UR: 55.7 mg/dL
MICROALBUM., U, RANDOM: 155.2 ug/mL
Microalb/Creat Ratio: 278.6 mg/g creat — ABNORMAL HIGH (ref 0.0–30.0)

## 2018-06-10 NOTE — Progress Notes (Signed)
Internal Medicine Clinic Attending  Case discussed with Dr. Santos-Sanchez at the time of the visit.  We reviewed the resident's history and exam and pertinent patient test results.  I agree with the assessment, diagnosis, and plan of care documented in the resident's note.    

## 2018-06-20 ENCOUNTER — Telehealth: Payer: Self-pay | Admitting: Internal Medicine

## 2018-07-12 ENCOUNTER — Ambulatory Visit: Payer: Self-pay

## 2018-07-12 ENCOUNTER — Encounter: Payer: Self-pay | Admitting: Internal Medicine

## 2018-09-06 ENCOUNTER — Ambulatory Visit: Payer: Self-pay | Admitting: Internal Medicine

## 2018-09-06 ENCOUNTER — Encounter: Payer: Self-pay | Admitting: Internal Medicine

## 2018-09-06 ENCOUNTER — Other Ambulatory Visit: Payer: Self-pay

## 2018-09-06 VITALS — BP 132/84 | HR 93 | Temp 98.7°F | Ht 62.4 in | Wt 136.3 lb

## 2018-09-06 DIAGNOSIS — IMO0002 Reserved for concepts with insufficient information to code with codable children: Secondary | ICD-10-CM

## 2018-09-06 DIAGNOSIS — R809 Proteinuria, unspecified: Secondary | ICD-10-CM

## 2018-09-06 DIAGNOSIS — R21 Rash and other nonspecific skin eruption: Secondary | ICD-10-CM

## 2018-09-06 DIAGNOSIS — Z79899 Other long term (current) drug therapy: Secondary | ICD-10-CM

## 2018-09-06 DIAGNOSIS — Z794 Long term (current) use of insulin: Secondary | ICD-10-CM

## 2018-09-06 DIAGNOSIS — Z23 Encounter for immunization: Secondary | ICD-10-CM

## 2018-09-06 DIAGNOSIS — E1165 Type 2 diabetes mellitus with hyperglycemia: Secondary | ICD-10-CM

## 2018-09-06 DIAGNOSIS — E113399 Type 2 diabetes mellitus with moderate nonproliferative diabetic retinopathy without macular edema, unspecified eye: Secondary | ICD-10-CM

## 2018-09-06 DIAGNOSIS — E1140 Type 2 diabetes mellitus with diabetic neuropathy, unspecified: Secondary | ICD-10-CM

## 2018-09-06 DIAGNOSIS — E1129 Type 2 diabetes mellitus with other diabetic kidney complication: Secondary | ICD-10-CM

## 2018-09-06 LAB — GLUCOSE, CAPILLARY: Glucose-Capillary: 290 mg/dL — ABNORMAL HIGH (ref 70–99)

## 2018-09-06 LAB — POCT GLYCOSYLATED HEMOGLOBIN (HGB A1C): HEMOGLOBIN A1C: 9.1 % — AB (ref 4.0–5.6)

## 2018-09-06 MED ORDER — LISINOPRIL 2.5 MG PO TABS: 2.5000 mg | ORAL_TABLET | Freq: Every day | ORAL | 5 refills | Status: DC

## 2018-09-06 MED ORDER — INSULIN NPH ISOPHANE & REGULAR (70-30) 100 UNIT/ML ~~LOC~~ SUSP: 25.0000 [IU] | Freq: Two times a day (BID) | SUBCUTANEOUS | 8 refills | Status: DC

## 2018-09-06 MED ORDER — MICONAZOLE NITRATE 2 % EX CREA: TOPICAL_CREAM | CUTANEOUS | 0 refills | Status: AC

## 2018-09-06 MED ORDER — GLUCOSE BLOOD VI STRP: ORAL_STRIP | 6 refills | Status: DC

## 2018-09-06 MED ORDER — METFORMIN HCL 1000 MG PO TABS: 1000.0000 mg | ORAL_TABLET | Freq: Two times a day (BID) | ORAL | 5 refills | Status: DC

## 2018-09-06 MED ORDER — ATORVASTATIN CALCIUM 40 MG PO TABS: 40.0000 mg | ORAL_TABLET | Freq: Every day | ORAL | 3 refills | Status: DC

## 2018-09-06 MED ORDER — GABAPENTIN 300 MG PO CAPS: 600.0000 mg | ORAL_CAPSULE | Freq: Two times a day (BID) | ORAL | 6 refills | Status: DC

## 2018-09-06 MED FILL — LISINOPRIL 2.5 MG TABLET: 2.5 | 30 days supply | Qty: 30 | Fill #0

## 2018-09-06 MED FILL — CONTOUR NEXT STRIPS: 30 days supply | Qty: 100 | Fill #0

## 2018-09-06 MED FILL — metFORMIN HCL 1000 MG TABS: 1000 | 30 days supply | Qty: 60 | Fill #0

## 2018-09-06 MED FILL — GABAPENTIN 300 MG CAPSULE: 300 | 30 days supply | Qty: 120 | Fill #0

## 2018-09-06 MED FILL — NOVOLIN 70/30 100 UNITS/ML: (70-30) 100 | 20 days supply | Qty: 10 | Fill #0

## 2018-09-06 MED FILL — ATORVASTATIN 40 MG TABLET: 40 | 30 days supply | Qty: 30 | Fill #0

## 2018-09-06 NOTE — Patient Instructions (Addendum)
Seor Ardyth Harps,   Siga inyectandose la insulina como lo esta [place], 25 unidades por la Diannia Ruder y 12 unidades por la noche. Por favor llame a la clinica si se le acaba la insulina. No espere a verme para pedir mas insulina.   Continue tomando metformin y lisinopril como siempre.   Para su colesterol, comienze a tomar Lipitor 1 t ableta todos los dias.   Para el dolor en sus pies, tome gabapentina 600 mg, 1 tableta dos veces al dia.   Para su salpullido, use la crema miconazole 2 veces al dia.   Hoy recibio su vacuna de la influenza.   Saque una cita de seguimiento conmigo en 3 meses para checar su diabetes.

## 2018-09-09 ENCOUNTER — Encounter: Payer: Self-pay | Admitting: Internal Medicine

## 2018-09-09 NOTE — Progress Notes (Signed)
Internal Medicine Clinic Attending  Case discussed with Dr. Santos-Sanchez at the time of the visit.  We reviewed the resident's history and exam and pertinent patient test results.  I agree with the assessment, diagnosis, and plan of care documented in the resident's note.  Alexander Raines, M.D., Ph.D.  

## 2018-09-09 NOTE — Assessment & Plan Note (Signed)
Mr. Kohen continues to complain of numbness and tingling in bilateral feet and hands. States gabapentin helped initially, but is not providing relief now. Will increase dose and reassess at next visit.   - Increase to gabapentin to 600 mg BID

## 2018-09-09 NOTE — Assessment & Plan Note (Signed)
Jason [name] presents for T2DM follow up. He is insulin-dependent and is on Novolin 70/30 25U in AM and 15U in PM as well as maximum dose of metformin. He is compliant with medications but ran out out insulin 2 weeks ago. A1c 12.9-> 9.1. Denies episodes of hypoglycemia.   - Refilled insulin at current dose, metformin, and lisinopril (for proteinuria) - Advise patient to call clinic of pharmacy when in need of refill  - Foot exam performed today - Eye exam at next visit  - Follow up in 3 months

## 2018-09-09 NOTE — Progress Notes (Signed)
   CC: T2DM follow up   HPI:  Mr.Danner Nadal is a 42 y.o. year-old male with PMH listed below who presents to clinic for follow up of T2DM. Please see problem based assessment and plan for further details.    Past Medical History:  Diagnosis Date  . Diabetes mellitus    diagnosed in april when admitted to [facility] with blood glucose of 581. HbA1C was 9.9  . DKA (diabetic ketoacidoses) (HCC)    hospitalized in 03/10/08  . Latent tuberculosis infection    PPD test measured 12 mm on 05/25/08. CXR on 06/03/08 showed no active disease or adenopathy. Started to take isoniazid 300 mg daily. on 06/11/08 and will  take medication  along with Vit  B6 for 9 months.  . Rheumatoid arthritis(714.0)    managed by Dr. Pollyann Savoy. sronegative. Dr. 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   Review of Systems:   Review of Systems  Constitutional: Negative for chills, fever and malaise/fatigue.  Respiratory: Negative for shortness of breath.   Cardiovascular: Negative for chest pain, palpitations and leg swelling.  Gastrointestinal: Negative for abdominal pain, constipation, diarrhea, nausea and vomiting.  Genitourinary: Positive for frequency. Negative for dysuria and urgency.  Neurological: Negative for dizziness and headaches.    Physical Exam: Vitals:   09/06/18 1347  BP: 132/84  Pulse: 93  Temp: 98.7 F (37.1 C)  TempSrc: Oral  SpO2: 99%  Weight: 136 lb 4.8 oz (61.8 kg)  Height: 5' 2.4" (1.585 m)    General: well-appearing young male in no acute distress  Cardiac: regular rate and rhythm, nl S1/S2, no murmurs, rubs or gallops  Pulm: CTAB, no wheezes or crackles, no increased work of breathing on room air  Ext: warm and well perfused, no peripheral edema, 2+ DP pulses bilaterally  Derm: scaly annular plaque with mild central clearing on L medial ankle (simlar to prior, see picture from 11/2017)   Assessment & Plan:   See  Encounters Tab for problem based charting.  Patient discussed with Dr. Sandre Kitty

## 2018-09-09 NOTE — Assessment & Plan Note (Signed)
[name] presents with an scaly annular plaque with central clearing on his L medial ankle. This was present when he was last seen in 05/2018 and thought to be fungal rash. He was recommended to use anti-fungal cream for this, but states he did not receive from the pharmacy.   - Refilled Miconazole 2% cream BID, reassess at next visit

## 2018-10-01 ENCOUNTER — Encounter: Payer: Self-pay | Admitting: Internal Medicine

## 2018-10-01 ENCOUNTER — Ambulatory Visit: Payer: Self-pay

## 2018-12-20 ENCOUNTER — Encounter: Payer: Self-pay | Admitting: Internal Medicine

## 2018-12-24 ENCOUNTER — Ambulatory Visit: Payer: Self-pay | Admitting: Internal Medicine

## 2018-12-24 ENCOUNTER — Other Ambulatory Visit: Payer: Self-pay

## 2018-12-24 ENCOUNTER — Encounter: Payer: Self-pay | Admitting: Internal Medicine

## 2018-12-24 VITALS — BP 126/81 | HR 85 | Temp 98.1°F | Wt 128.8 lb

## 2018-12-24 DIAGNOSIS — E1140 Type 2 diabetes mellitus with diabetic neuropathy, unspecified: Secondary | ICD-10-CM

## 2018-12-24 DIAGNOSIS — IMO0002 Reserved for concepts with insufficient information to code with codable children: Secondary | ICD-10-CM

## 2018-12-24 DIAGNOSIS — Z9114 Patient's other noncompliance with medication regimen: Secondary | ICD-10-CM

## 2018-12-24 DIAGNOSIS — E113399 Type 2 diabetes mellitus with moderate nonproliferative diabetic retinopathy without macular edema, unspecified eye: Secondary | ICD-10-CM

## 2018-12-24 DIAGNOSIS — E1165 Type 2 diabetes mellitus with hyperglycemia: Secondary | ICD-10-CM

## 2018-12-24 DIAGNOSIS — B354 Tinea corporis: Secondary | ICD-10-CM

## 2018-12-24 DIAGNOSIS — E785 Hyperlipidemia, unspecified: Secondary | ICD-10-CM

## 2018-12-24 DIAGNOSIS — Z79899 Other long term (current) drug therapy: Secondary | ICD-10-CM

## 2018-12-24 LAB — BASIC METABOLIC PANEL
ANION GAP: 8 (ref 5–15)
BUN: 13 mg/dL (ref 6–20)
CALCIUM: 9.1 mg/dL (ref 8.9–10.3)
CO2: 25 mmol/L (ref 22–32)
Chloride: 102 mmol/L (ref 98–111)
Creatinine, Ser: 0.73 mg/dL (ref 0.61–1.24)
Glucose, Bld: 411 mg/dL — ABNORMAL HIGH (ref 70–99)
POTASSIUM: 4.1 mmol/L (ref 3.5–5.1)
Sodium: 135 mmol/L (ref 135–145)

## 2018-12-24 LAB — POCT GLYCOSYLATED HEMOGLOBIN (HGB A1C): HEMOGLOBIN A1C: 11.2 % — AB (ref 4.0–5.6)

## 2018-12-24 LAB — GLUCOSE, CAPILLARY: Glucose-Capillary: 357 mg/dL — ABNORMAL HIGH (ref 70–99)

## 2018-12-24 LAB — POCT URINALYSIS DIPSTICK
Bilirubin, UA: NEGATIVE
GLUCOSE UA: POSITIVE — AB
Ketones, UA: NEGATIVE
LEUKOCYTES UA: NEGATIVE
NITRITE UA: NEGATIVE
PH UA: 5 (ref 5.0–8.0)
Protein, UA: NEGATIVE
Spec Grav, UA: 1.01 (ref 1.010–1.025)
UROBILINOGEN UA: 0.2 U/dL

## 2018-12-24 MED ORDER — GABAPENTIN 600 MG PO TABS: 600.0000 mg | ORAL_TABLET | Freq: Three times a day (TID) | ORAL | 3 refills | Status: DC

## 2018-12-24 MED ORDER — CLOTRIMAZOLE 1 % EX CREA: 1.0000 "application " | TOPICAL_CREAM | Freq: Two times a day (BID) | CUTANEOUS | 0 refills | Status: DC

## 2018-12-24 MED ORDER — SODIUM CHLORIDE 0.9 % IV BOLUS
1000.0000 mL | Freq: Once | INTRAVENOUS | Status: AC
  Administered 2018-12-24: 1000 mL via INTRAVENOUS

## 2018-12-24 MED FILL — GABAPENTIN 600 MG TABLET: 600 | 30 days supply | Qty: 90 | Fill #0

## 2018-12-24 NOTE — Progress Notes (Signed)
   CC: Diabetes follow up   HPI:  Mr.Brooke Damery is a 43 y.o. year-old male with PMH listed below who presents to clinic for diabetes follow up. Please see problem based assessment and plan for further details.   Past Medical History:  Diagnosis Date  . Diabetes mellitus    diagnosed in april when admitted to [facility] with blood glucose of 581. HbA1C was 9.9  . DKA (diabetic ketoacidoses) (HCC)    hospitalized in 03/10/08  . Latent tuberculosis infection    PPD test measured 12 mm on 05/25/08. CXR on 06/03/08 showed no active disease or adenopathy. Started to take isoniazid 300 mg daily. on 06/11/08 and will  take medication  along with Vit  B6 for 9 months.  . Rheumatoid arthritis(714.0)    managed by Dr. Pollyann Savoy. sronegative. Dr. 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   Review of Systems:   Review of Systems  Constitutional: Positive for malaise/fatigue and weight loss. Negative for chills and fever.       Loss of appetite  Gastrointestinal: Positive for abdominal pain and nausea.  Genitourinary: Positive for frequency and urgency.  Musculoskeletal: Positive for joint pain and myalgias.  Neurological: Positive for dizziness and headaches.  Endo/Heme/Allergies: Positive for polydipsia.    Physical Exam: Vitals:   12/24/18 1043  BP: 107/70  Pulse: 78  Temp: 98.1 F (36.7 C)  TempSrc: Oral  SpO2: 96%  Weight: 128 lb 12.8 oz (58.4 kg)    General: young male, tired-appearing in NAD  HENT: dry MM, OP clear without exudates or erythema Cardiac: regular rate and rhythm, nl S1/S2, no murmurs, rubs or gallops Pulm: CTAB, no wheezes or crackles, no increased work of breathing on room air  Abd: soft, diffuse mild tenderness over abdomen, hyperactive  bowel sounds present  Ext: warm and well perfused, no peripheral edema, skin is dry  Derm: hyperpigmented (brown), round, well-demarcated patches over bilateral  extremities with mild scaling, no erythema or warmth, no wounds appreciated     Assessment & Plan:   See Encounters Tab for problem based charting.  Patient discussed with Dr. Josem Kaufmann

## 2018-12-24 NOTE — Patient Instructions (Addendum)
Jason [name] ,  Continue usando su insulina como lo esta [place], 25 unidades en la maana y 15 en la tarde. Continue tomando metformina tambien. Es super importante que use su insulina todos los [place]. Mejor control de la diabetes va a ayudar con el dolor de los pies y el salpullido en sus piernas.   Para el dolor de los pies tome gabapentin 600 mg tres veces al dia. Si esta dosis no le funciona, podemos subirla durante su proxima visita.   Para el salpullido en sus piernas, use la crema lotrimin dos veces al dia.   Tiene rellenados de todas las medicinas en la farmacia. Si le have falta algun medicamente por favor llame a la clinica y dejenos saber.   Haga una cita de seguimiento conmigo en 3 meses.   - Dra. Evelene Croon

## 2018-12-25 ENCOUNTER — Encounter: Payer: Self-pay | Admitting: Internal Medicine

## 2018-12-25 LAB — URINALYSIS, ROUTINE W REFLEX MICROSCOPIC
Bilirubin, UA: NEGATIVE
KETONES UA: NEGATIVE
Leukocytes, UA: NEGATIVE
Nitrite, UA: NEGATIVE
PROTEIN UA: NEGATIVE
RBC, UA: NEGATIVE
UUROB: 0.2 mg/dL (ref 0.2–1.0)
pH, UA: 5 (ref 5.0–7.5)

## 2018-12-25 LAB — LIPID PANEL
CHOL/HDL RATIO: 4.8 ratio (ref 0.0–5.0)
CHOLESTEROL TOTAL: 206 mg/dL — AB (ref 100–199)
HDL: 43 mg/dL (ref >39)
LDL CALC: 112 mg/dL — AB (ref 0–99)
Triglycerides: 256 mg/dL — ABNORMAL HIGH (ref 0–149)
VLDL Cholesterol Cal: 51 mg/dL — ABNORMAL HIGH (ref 5–40)

## 2018-12-25 NOTE — Assessment & Plan Note (Signed)
Patient continues to complain of an itchy rash to bilateral lower extremities.  He has been evaluated for this before and prescribed miconazole for tinea corporis.  He did not pick up this medication from the pharmacy, but reports buying a cream over-the-counter that did not help.  On exam his rash is unchanged from last visit.  He continues to have round, well demarcated hyperpigmented lesions of different sizes across his bilateral lower extremities associated with scaling consistent with a fungal etiology.  I sent a prescription for Lotrimin cream to be used twice daily, but ultimately discussed the importance of better diabetic control in order to avoid this of a complication.  Also recommended avoiding humidity and maintaining the area dry.

## 2018-12-25 NOTE — Assessment & Plan Note (Signed)
Patient has a history of hyperlipidemia and is not on a statin. He used to be on a intermediate one, pravastatin 40 mg. Unclear why this was discontinued. Will check lipid panel today and plan to start a statin.

## 2018-12-25 NOTE — Assessment & Plan Note (Signed)
Jason [name] presents for DM follow up. He complains of abdominal pain, N/V, dizziness, HA, weight loss, polyuria, and polydipsia during today's visit. He is on Novolin 70/30 25AM/15PM and maximum dose of metformin. His diabetes management has been difficult due to medication non-adherence from transportation issues and not letting us know when he runs out of medications. He is well controlled when he is compliant with his diabetic regimen. When I last saw him in 08/2018 I sent a 6 month supply of all his prescription to [facility]. However, he reports running out of insulin 3-4 weeks ago. I called [facility] and spoke with Shanda Bumps. She confirmed patient only picked up a 30 day supply back in 08/2018 and has not picked up any refills since then. Because he gets his medications through the IM program, they are not able to provide more than a 30-day supply at a time. Therefore he has not been on insulin since November 2019.   On exam he appears dry and tired.  He is nauseous and complains of mild, diffuse abdominal tenderness.  He is mentating well and is fully alert and oriented.  Patient has had 2 episodes of DKA in the past and his presenting symptoms today are concerning for DKA.  We will therefore order STAT BMP and a UA to check for acidosis and ketones. Will also give 1L NS bolus while waiting for results.   - BMP only remarkable for hyperglycemia with BG in the 400s, no acidosis or lyte abnormalities noted  - No ketones on UA - Discussed benefits of compliance with medications and importance of prioritizing his health. Also discussed he can get Novolin 70/30 OTC at [facility] if unable to get to [facility] but it would be more expensive  - I did not refill his insulin today as he has 6 refills available at the pharmacy

## 2018-12-25 NOTE — Progress Notes (Signed)
Case discussed with Dr. Santos-Sanchez at the time of the visit.  We reviewed the resident's history and exam and pertinent patient test results.  I agree with the assessment, diagnosis and plan of care documented in the resident's note. 

## 2018-12-25 NOTE — Assessment & Plan Note (Signed)
Patient continues to complain of tingling and pain in bilateral feet.  I increased the dose of gabapentin to 600 twice daily last time I saw him in 08/2018.  He only took this for 1 month, reports not experiencing any improvement in pain.  We will increase his gabapentin dose to 600 mg 3 times daily.  I also discussed the importance of medication compliance in order to fully benefit from the effects of his medications.  Can consider amitriptyline in the future if further increase in gabapentin fails to improve symptoms.

## 2018-12-26 ENCOUNTER — Telehealth: Payer: Self-pay

## 2018-12-26 NOTE — Telephone Encounter (Signed)
Patient only received prescription for gabapentin when he went to [facility] outpatient pharmacy to pick up his medications 2 days ago.  States the pharmacist told him there was an issue with my prescription number.  I called the pharmacy.  His medicines including metformin, Novolin 70/30, atorvastatin are all ready for pickup.  Called patient to inform him. He stated he will pick them up on Tuesday as he will not have ride until then.

## 2018-12-26 NOTE — Telephone Encounter (Signed)
Requesting to speak with dr Lovenia Kimsantos-sanchez.

## 2019-01-07 MED FILL — ATORVASTATIN 40 MG TABLET: 40 | 30 days supply | Qty: 30 | Fill #1

## 2019-01-07 MED FILL — CONTOUR NEXT STRIPS: 30 days supply | Qty: 100 | Fill #1

## 2019-01-07 MED FILL — LISINOPRIL 2.5 MG TABLET: 2.5 | 30 days supply | Qty: 30 | Fill #1

## 2019-01-07 MED FILL — NOVOLIN 70/30 100 UNITS/ML: (70-30) 100 | 25 days supply | Qty: 10 | Fill #0

## 2019-01-07 MED FILL — metFORMIN HCL 1000 MG TABS: 1000 | 30 days supply | Qty: 60 | Fill #1

## 2019-02-13 ENCOUNTER — Other Ambulatory Visit: Payer: Self-pay | Admitting: Internal Medicine

## 2019-02-13 DIAGNOSIS — IMO0002 Reserved for concepts with insufficient information to code with codable children: Secondary | ICD-10-CM

## 2019-02-13 DIAGNOSIS — E1165 Type 2 diabetes mellitus with hyperglycemia: Principal | ICD-10-CM

## 2019-02-13 DIAGNOSIS — E113399 Type 2 diabetes mellitus with moderate nonproliferative diabetic retinopathy without macular edema, unspecified eye: Secondary | ICD-10-CM

## 2019-02-13 NOTE — Telephone Encounter (Signed)
Refill Request- Pt states he is completely out needs insulin for today.  insulin NPH-regular Human (NOVOLIN 70/30) (70-30) 100 UNIT/ML injection  OUTPATIENT PHARMACY - [place], [place] - [address]

## 2019-02-14 MED ORDER — INSULIN NPH ISOPHANE & REGULAR (70-30) 100 UNIT/ML ~~LOC~~ SUSP: 25.0000 [IU] | Freq: Two times a day (BID) | SUBCUTANEOUS | 8 refills | Status: DC

## 2019-03-28 ENCOUNTER — Other Ambulatory Visit: Payer: Self-pay

## 2019-03-28 ENCOUNTER — Encounter: Payer: Self-pay | Admitting: Internal Medicine

## 2019-03-30 ENCOUNTER — Telehealth: Payer: Self-pay | Admitting: Internal Medicine

## 2019-03-30 NOTE — Telephone Encounter (Signed)
Called patient several times for telehealth appointment. No answer. Left a voice message to return call.  

## 2019-05-19 ENCOUNTER — Other Ambulatory Visit: Payer: Self-pay

## 2019-05-19 ENCOUNTER — Ambulatory Visit: Payer: Self-pay | Admitting: Internal Medicine

## 2019-05-19 VITALS — BP 124/87 | HR 92 | Temp 98.5°F | Ht 64.4 in | Wt 142.7 lb

## 2019-05-19 DIAGNOSIS — IMO0002 Reserved for concepts with insufficient information to code with codable children: Secondary | ICD-10-CM

## 2019-05-19 DIAGNOSIS — J309 Allergic rhinitis, unspecified: Secondary | ICD-10-CM

## 2019-05-19 DIAGNOSIS — T7840XA Allergy, unspecified, initial encounter: Secondary | ICD-10-CM | POA: Insufficient documentation

## 2019-05-19 DIAGNOSIS — B354 Tinea corporis: Secondary | ICD-10-CM

## 2019-05-19 DIAGNOSIS — E113399 Type 2 diabetes mellitus with moderate nonproliferative diabetic retinopathy without macular edema, unspecified eye: Secondary | ICD-10-CM

## 2019-05-19 DIAGNOSIS — E1140 Type 2 diabetes mellitus with diabetic neuropathy, unspecified: Secondary | ICD-10-CM

## 2019-05-19 DIAGNOSIS — Z794 Long term (current) use of insulin: Secondary | ICD-10-CM

## 2019-05-19 LAB — GLUCOSE, CAPILLARY: Glucose-Capillary: 331 mg/dL — ABNORMAL HIGH (ref 70–99)

## 2019-05-19 MED ORDER — GABAPENTIN 600 MG PO TABS: 600.0000 mg | ORAL_TABLET | Freq: Three times a day (TID) | ORAL | 3 refills | Status: DC

## 2019-05-19 MED ORDER — FLUTICASONE PROPIONATE 50 MCG/ACT NA SUSP: 2.0000 | Freq: Every day | NASAL | 2 refills | Status: DC

## 2019-05-19 MED ORDER — LORATADINE 10 MG PO TABS: 10.0000 mg | ORAL_TABLET | Freq: Every day | ORAL | 0 refills | Status: DC

## 2019-05-19 MED ORDER — METFORMIN HCL 1000 MG PO TABS: 1000.0000 mg | ORAL_TABLET | Freq: Two times a day (BID) | ORAL | 5 refills | Status: DC

## 2019-05-19 MED ORDER — INSULIN NPH ISOPHANE & REGULAR (70-30) 100 UNIT/ML ~~LOC~~ SUSP: 25.0000 [IU] | Freq: Two times a day (BID) | SUBCUTANEOUS | 8 refills | Status: DC

## 2019-05-19 MED FILL — metFORMIN HCL 1000 MG TABS: 1000 | 30 days supply | Qty: 60 | Fill #0

## 2019-05-19 MED FILL — NOVOLIN 70/30 100 UNITS/ML: (70-30) 100 | 20 days supply | Qty: 10 | Fill #0

## 2019-05-19 MED FILL — FLUTICASONE PROP 50 MCG SPR: 50 | 30 days supply | Qty: 16 | Fill #0

## 2019-05-19 NOTE — Assessment & Plan Note (Signed)
Patient presents for DM follow up. He has been out of his insulin for about 1.5 months. He sates the [place] pharmacy was closed and he tried to call out office a couple of times, but did not get a call back. It appears he was scheduled for a tele-health visit in may but he was unable to be reached. Will refill medicines and have patient continue to follow up with PCP. CBG 331 today. He is prescribed 25U BID, but last 2 noted list 25 qAM and 15 qPM. Will refill as written 25U BID. - Metformin 1000mg  BID - Insulin 70/30 25U BID

## 2019-05-19 NOTE — Progress Notes (Signed)
   CC: Diabetes, Allergies, Rash  HPI:  [name] is a 43 y.o. M with PMHx listed below presenting for Diabetes, Allergies, Rash. Please see the A&P for the status of the patient's chronic medical problems.  Past Medical History:  Diagnosis Date  . Diabetes mellitus    diagnosed in april when admitted to [facility] with blood glucose of 581. HbA1C was 9.9  . DKA (diabetic ketoacidoses) ([place])    hospitalized in 03/10/08  . Latent tuberculosis infection    PPD test measured 12 mm on 05/25/08. CXR on 06/03/08 showed no active disease or adenopathy. Started to take isoniazid 300 mg daily. on 06/11/08 and will  take medication  along with Vit  B6 for 9 months.  . Rheumatoid arthritis(714.0)    managed by Dr. Bo Merino. sronegative. Dr. 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   Review of Systems:  Performed and all others negative.  Physical Exam:  Vitals:   05/19/19 1557  BP: 124/87  Pulse: 92  Temp: 98.5 F (36.9 C)  TempSrc: Oral  SpO2: 99%  Weight: 142 lb 11.2 oz (64.7 kg)  Height: 5' 4.4" (1.636 m)   Physical Exam Constitutional:      General: He is not in acute distress.    Appearance: Normal appearance.  Cardiovascular:     Rate and Rhythm: Normal rate and regular rhythm.     Pulses: Normal pulses.     Heart sounds: Normal heart sounds.  Pulmonary:     Effort: Pulmonary effort is normal. No respiratory distress.     Breath sounds: Normal breath sounds.  Abdominal:     General: Bowel sounds are normal. There is no distension.     Palpations: Abdomen is soft.     Tenderness: There is no abdominal tenderness.  Musculoskeletal:        General: No swelling or deformity.  Skin:    General: Skin is warm and dry.     Comments: Bilateral medial distal LE rashes, see images  Neurological:     General: No focal deficit present.     Mental Status: Mental status is at baseline.         Assessment & Plan:    See Encounters Tab for problem based charting.  Patient discussed with Dr. Rebeca Alert

## 2019-05-19 NOTE — Assessment & Plan Note (Signed)
Patient describes months of runny nose, watery eyes, and intermittent sinus congestion. He is concerned for chronic viral infections, but his presentation is suspicious for allergies. Will treat with antihistamine and nasal steroid and monitor for response. - Claritin 10mg  Daily - Flonase, 2 sprays in each nostril Daily

## 2019-05-19 NOTE — Assessment & Plan Note (Signed)
Patient continues to complain of itchy/painful rash. He has been treated with antifungals in the past, but states the creams have not helped much. Will defer to PCP as he apparently has a follow up coming up.  ADDENDUM: No follow up scheduled with have patient scheduled for next available.

## 2019-05-19 NOTE — Patient Instructions (Addendum)
Thank you for allowing [place] to care for you  For your Diabetes - Resume previous medications - Refills sent to [facility]  For your runny nose and watery eyes - This may be due to allergies - Claritin and Flonase prescribed to use daily - You can consider nasal rinse  For your skin changes - We will allow Dr. Frederico Hamman to address this at follow up  Follow up with PCP   Traducido con el traductor de google  Gracias por permitirnos cuidarlo  Para su diabetes - Reanudar medicamentos anteriores - Repuestos enviados a la farmacia ambulatoria del cono de [place]  Para tu nariz que moquea y ojos llorosos - Esto puede deberse a Set designer. - Claritin y Flonase se prescriben para usar diariamente - Puedes considerar el enjuague nasal  Para tus cambios de piel - Permitiremos que el Dr. Frederico Hamman aborde esto en el seguimiento  Seguimiento con PCP

## 2019-05-28 NOTE — Progress Notes (Signed)
Internal Medicine Clinic Attending  Case discussed with Dr. Melvin at the time of the visit.  We reviewed the resident's history and exam and pertinent patient test results.  I agree with the assessment, diagnosis, and plan of care documented in the resident's note.  Alexander Raines, M.D., Ph.D.  

## 2019-06-04 ENCOUNTER — Ambulatory Visit: Payer: Self-pay

## 2019-06-04 ENCOUNTER — Encounter: Payer: Self-pay | Admitting: Internal Medicine

## 2019-06-16 ENCOUNTER — Encounter: Payer: Self-pay | Admitting: Internal Medicine

## 2019-07-18 ENCOUNTER — Encounter: Payer: Self-pay | Admitting: Internal Medicine

## 2019-09-01 ENCOUNTER — Encounter: Payer: Self-pay | Admitting: Internal Medicine

## 2019-10-01 ENCOUNTER — Ambulatory Visit: Payer: Self-pay | Admitting: Internal Medicine

## 2019-10-01 ENCOUNTER — Other Ambulatory Visit: Payer: Self-pay

## 2019-10-01 VITALS — BP 118/75 | HR 91 | Temp 98.2°F | Ht 64.4 in | Wt 137.5 lb

## 2019-10-01 DIAGNOSIS — H547 Unspecified visual loss: Secondary | ICD-10-CM

## 2019-10-01 DIAGNOSIS — E1165 Type 2 diabetes mellitus with hyperglycemia: Secondary | ICD-10-CM

## 2019-10-01 DIAGNOSIS — Z9114 Patient's other noncompliance with medication regimen: Secondary | ICD-10-CM

## 2019-10-01 DIAGNOSIS — IMO0002 Reserved for concepts with insufficient information to code with codable children: Secondary | ICD-10-CM

## 2019-10-01 DIAGNOSIS — Z794 Long term (current) use of insulin: Secondary | ICD-10-CM

## 2019-10-01 DIAGNOSIS — E113399 Type 2 diabetes mellitus with moderate nonproliferative diabetic retinopathy without macular edema, unspecified eye: Secondary | ICD-10-CM

## 2019-10-01 DIAGNOSIS — R0981 Nasal congestion: Secondary | ICD-10-CM

## 2019-10-01 DIAGNOSIS — J3489 Other specified disorders of nose and nasal sinuses: Secondary | ICD-10-CM

## 2019-10-01 DIAGNOSIS — T7840XA Allergy, unspecified, initial encounter: Secondary | ICD-10-CM

## 2019-10-01 DIAGNOSIS — B354 Tinea corporis: Secondary | ICD-10-CM

## 2019-10-01 LAB — POCT GLYCOSYLATED HEMOGLOBIN (HGB A1C): Hemoglobin A1C: 10.7 % — AB (ref 4.0–5.6)

## 2019-10-01 LAB — GLUCOSE, CAPILLARY: Glucose-Capillary: 331 mg/dL — ABNORMAL HIGH (ref 70–99)

## 2019-10-01 MED ORDER — INSULIN NPH ISOPHANE & REGULAR (70-30) 100 UNIT/ML ~~LOC~~ SUSP: 25.0000 [IU] | Freq: Two times a day (BID) | SUBCUTANEOUS | 8 refills | Status: DC

## 2019-10-01 MED ORDER — METFORMIN HCL 1000 MG PO TABS: 1000.0000 mg | ORAL_TABLET | Freq: Two times a day (BID) | ORAL | 5 refills | Status: DC

## 2019-10-01 MED ORDER — FLUCONAZOLE 150 MG PO TABS: 150.0000 mg | ORAL_TABLET | ORAL | 0 refills | Status: DC

## 2019-10-01 MED ORDER — CETIRIZINE HCL 10 MG PO TABS: 10.0000 mg | ORAL_TABLET | Freq: Every day | ORAL | 3 refills | Status: DC

## 2019-10-01 MED FILL — metFORMIN HCL 1000 MG TABS: 1000 | 30 days supply | Qty: 60 | Fill #0

## 2019-10-01 MED FILL — HUMULIN 70/30 VIAL: (70-30) 100 | 20 days supply | Qty: 10 | Fill #0

## 2019-10-01 MED FILL — FLUCONAZOLE 150 MG TABLET: 150 | 21 days supply | Qty: 3 | Fill #0

## 2019-10-01 NOTE — Assessment & Plan Note (Signed)
Patient presents continuing to report an itchy rash over his bilateral legs consistent with tinea corporis that has not responded to topical treatments for the past year. On exam, he continues to have annular, hyperpigmented patches over bilateral LE. Some of the patches are lighter in color, which I suspect is just hyperpigmentation and not active fungal infection. Will try fluconazole 150mg  x 3 weeks given multiple failed topical courses. Advised patient that hyperpigmentation will take some time to resolve even after the fungal infection is treated.

## 2019-10-01 NOTE — Assessment & Plan Note (Signed)
Patient has a history of sinus congestion associated with eye lacrimation, sneezing and rhinorrhea for the past year consistent with allergic rhinitis.  He was seen for this in 04/2019 and prescribed Claritin and Flonase.  Reports Flonase helps a little and Claritin did not help at all.  Continues to report the same symptoms today. No fever, cough, or shortness of breath.  We will continue Flonase and switch to cetirizine 10 mg daily.  Advised to avoid allergens as much as he can.

## 2019-10-01 NOTE — Assessment & Plan Note (Signed)
Patient reports the sclera of his L eye became red acutely 7 days ago. This was associated with transient blurry vision which has since resolved. No other vision changes at this time. He denies pain, recent trauma, constant scratching, and foreign body sensation. On exam, there is no conjunctival injection present, EOM are intact and pupils are equal and reactive. Visual field testing was normal on bilateral eyes. Suspect he had a subconjunctival hemorrhage that has spontaneously resolved.  We will continue to monitor.

## 2019-10-01 NOTE — Patient Instructions (Signed)
Jason [name],   Para su congestion, tome cetirizine 10 mg diarios (1 tableta).   Para su ojo, lo ideal seria referirlo a un especialista del ojo lo cual no podemos hacer en este momento. Como su vision esta mejorando vamos a Diplomatic Services operational officer. Si pierde la vision de momento, vaya a la sala de emergencias.   Para su salpullido, le recete fluconazole. Se lo va a tomar 1 una vez a la semana por 3 semanas.   Haga una cita de seguimiento conmigo en 3 meses para ver como sigue.   - Dra. Frederico Hamman

## 2019-10-01 NOTE — Assessment & Plan Note (Signed)
Patient did not present for diabetes follow-up but this was addressed today as he has missed multiple follow-up visits with PCP.  He is insulin-dependent and is currently on 70/30 25U BID and maximum dose Metformin.  He reports intermittent compliance with both medications, in part because he is unable to pick up prescriptions on a monthly basis. Unfortunately, he is not able to afford meds if not prescribed through IM Program at [facility] which only dispenses meds for 30 days.  A1c 10.7 from 11.2 nine months ago. Continue to educate on importance of compliance. He is aware he can buy his insulin over the counter at [facility] if he ever runs out and is not able to go to [facility] for a refill. Asked him to follow up in 3 months and advised to keep appointment (multiple no shows and may be dismissed from clinic).

## 2019-10-01 NOTE — Progress Notes (Signed)
   CC: allergies, vision problem, tinea corporis   HPI:  [name] is a 43 y.o. year-old male with PMH listed below who presents to clinic for allergies, vision problem, tinea corporis. Please see problem based assessment and plan for further details.   Past Medical History:  Diagnosis Date  . Diabetes mellitus    diagnosed in april when admitted to [facility] with blood glucose of 581. HbA1C was 9.9  . DKA (diabetic ketoacidoses) ([place])    hospitalized in 03/10/08  . Latent tuberculosis infection    PPD test measured 12 mm on 05/25/08. CXR on 06/03/08 showed no active disease or adenopathy. Started to take isoniazid 300 mg daily. on 06/11/08 and will  take medication  along with Vit  B6 for 9 months.  . Rheumatoid arthritis(714.0)    managed by Dr. Bo [name]. sronegative. Dr. 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   Review of Systems:   Review of Systems  Constitutional: Negative for fever.  HENT: Positive for congestion and sinus pain.   Eyes: Negative for blurred vision, double vision, photophobia, pain, discharge and redness.  Respiratory: Negative for cough and shortness of breath.   Skin: Positive for itching and rash.     Physical Exam:  Vitals:   10/01/19 0930  BP: 118/75  Pulse: 91  Temp: 98.2 F (36.8 C)  TempSrc: Oral  SpO2: 99%  Weight: 137 lb 8 oz (62.4 kg)  Height: 5' 4.4" (1.636 m)    General: young male, well-appearing, in no acute distress  HENT: NCAT, neck supple and FROM, OP clear without exudates or erythema, sinus pressure on frontal and maxillary sinuses  Eyes: anicteric sclera, PERRL, no conjunctival injection, no visual field abnormalities    Assessment & Plan:   See Encounters Tab for problem based charting.  Patient discussed with Dr. Dareen Piano

## 2019-10-08 ENCOUNTER — Ambulatory Visit: Payer: Self-pay

## 2019-10-08 NOTE — Progress Notes (Signed)
Internal Medicine Clinic Attending  Case discussed with Dr. Santos-Sanchez at the time of the visit.  We reviewed the resident's history and exam and pertinent patient test results.  I agree with the assessment, diagnosis, and plan of care documented in the resident's note.    

## 2019-12-29 ENCOUNTER — Encounter: Payer: Self-pay | Admitting: Internal Medicine

## 2020-01-05 ENCOUNTER — Encounter: Payer: Self-pay | Admitting: Internal Medicine

## 2020-01-12 ENCOUNTER — Ambulatory Visit: Payer: Self-pay | Admitting: Internal Medicine

## 2020-01-12 ENCOUNTER — Encounter: Payer: Self-pay | Admitting: Internal Medicine

## 2020-01-12 VITALS — BP 117/80 | HR 90 | Temp 98.2°F | Ht 64.0 in | Wt 139.9 lb

## 2020-01-12 DIAGNOSIS — E785 Hyperlipidemia, unspecified: Secondary | ICD-10-CM

## 2020-01-12 DIAGNOSIS — E1165 Type 2 diabetes mellitus with hyperglycemia: Secondary | ICD-10-CM

## 2020-01-12 DIAGNOSIS — Z23 Encounter for immunization: Secondary | ICD-10-CM

## 2020-01-12 DIAGNOSIS — E1151 Type 2 diabetes mellitus with diabetic peripheral angiopathy without gangrene: Secondary | ICD-10-CM

## 2020-01-12 DIAGNOSIS — E1140 Type 2 diabetes mellitus with diabetic neuropathy, unspecified: Secondary | ICD-10-CM

## 2020-01-12 DIAGNOSIS — IMO0002 Reserved for concepts with insufficient information to code with codable children: Secondary | ICD-10-CM

## 2020-01-12 DIAGNOSIS — I872 Venous insufficiency (chronic) (peripheral): Secondary | ICD-10-CM | POA: Insufficient documentation

## 2020-01-12 DIAGNOSIS — E113393 Type 2 diabetes mellitus with moderate nonproliferative diabetic retinopathy without macular edema, bilateral: Secondary | ICD-10-CM

## 2020-01-12 DIAGNOSIS — Z79899 Other long term (current) drug therapy: Secondary | ICD-10-CM

## 2020-01-12 DIAGNOSIS — Z794 Long term (current) use of insulin: Secondary | ICD-10-CM

## 2020-01-12 DIAGNOSIS — E113399 Type 2 diabetes mellitus with moderate nonproliferative diabetic retinopathy without macular edema, unspecified eye: Secondary | ICD-10-CM

## 2020-01-12 DIAGNOSIS — Z9114 Patient's other noncompliance with medication regimen: Secondary | ICD-10-CM

## 2020-01-12 LAB — POCT GLYCOSYLATED HEMOGLOBIN (HGB A1C): Hemoglobin A1C: 11.9 % — AB (ref 4.0–5.6)

## 2020-01-12 LAB — GLUCOSE, CAPILLARY: Glucose-Capillary: 251 mg/dL — ABNORMAL HIGH (ref 70–99)

## 2020-01-12 MED ORDER — INSULIN NPH ISOPHANE & REGULAR (70-30) 100 UNIT/ML ~~LOC~~ SUSP: 25.0000 [IU] | Freq: Two times a day (BID) | SUBCUTANEOUS | 8 refills | Status: DC

## 2020-01-12 MED ORDER — ATORVASTATIN CALCIUM 40 MG PO TABS: 40.0000 mg | ORAL_TABLET | Freq: Every day | ORAL | 3 refills | Status: DC

## 2020-01-12 MED ORDER — METFORMIN HCL 1000 MG PO TABS: 1000.0000 mg | ORAL_TABLET | Freq: Two times a day (BID) | ORAL | 5 refills | Status: DC

## 2020-01-12 MED FILL — ATORVASTATIN 40 MG TABLET: 40 | 30 days supply | Qty: 30 | Fill #0

## 2020-01-12 MED FILL — HUMULIN 70/30 VIAL: (70-30) 100 | 20 days supply | Qty: 10 | Fill #0

## 2020-01-12 MED FILL — metFORMIN HCL 1000 MG TABS: 1000 | 30 days supply | Qty: 60 | Fill #0

## 2020-01-12 NOTE — Patient Instructions (Addendum)
Jason [name],   Continue usando novolin 70/30    25 [employer] al dia. Tambien continue tomando metformina y atorvastatin para su diabetes. Le envie rellenados de todos sus medicamentos a la Corporate investment banker.   Haga cita de seguimiento conmigo en 3 meses.   - Dr. Evelene Croon

## 2020-01-13 LAB — BMP8+ANION GAP
Anion Gap: 16 mmol/L (ref 10.0–18.0)
BUN/Creatinine Ratio: 30 — ABNORMAL HIGH (ref 9–20)
BUN: 17 mg/dL (ref 6–24)
CO2: 21 mmol/L (ref 20–29)
Calcium: 9.1 mg/dL (ref 8.7–10.2)
Chloride: 99 mmol/L (ref 96–106)
Creatinine, Ser: 0.57 mg/dL — ABNORMAL LOW (ref 0.76–1.27)
GFR calc Af Amer: 147 mL/min/{1.73_m2} (ref >59)
GFR calc non Af Amer: 128 mL/min/{1.73_m2} (ref >59)
Glucose: 222 mg/dL — ABNORMAL HIGH (ref 65–99)
Potassium: 3.8 mmol/L (ref 3.5–5.2)
Sodium: 136 mmol/L (ref 134–144)

## 2020-01-13 NOTE — Assessment & Plan Note (Addendum)
Patient has a history of uncontrolled, insulin-dependent T2DM with hyperglycemia, retinopathy, and neuropathy. Has also had 2 episodes of DKA in the past. He is on maximum dose of metformin and NPH 70/30 25 units BID. He is not compliant with this regimen. States he is only taking metformin at this time, however pharmacy tech reported he has not picked up any prescriptions for the past 2-3 months. His BG today is 252 and A1c 11.9 from 10.7 three months ago. He is asymptomatic. When asked what is interfering with adherence he states "I don't know, I just don't pick up medications from the pharmacy." He denies issues with medication cost. We discussed long term complications of T2DM, some which he is already experiencing. I told him this eventually could lead to blindness, extremity amputations, dialysis, and death. He verbalized understanding. He certainly would benefit from insulin therapy, but I offered to stop it due to non-adherence and strongly recommended switching to oral agents if that is easier for him and would improve compliance. He declined and states he prefers to continue insulin because he is familiar with this medication. Unfortunately, it appears to me he lacks the motivation to participate in his treatment plan and I am not quite sure he truly understands the long term complications of his illness. I expect his DM to be difficult to control because of this. Insulin refilled today per his request.

## 2020-01-13 NOTE — Assessment & Plan Note (Signed)
Patient has been evaluated several times for a pruritic rash in his bilateral LE that has been present for over 6 months. Lesions initially appeared fungal in nature and he was treated with different antifungal creams and Diflucan. He has failed to respond to treatment. He presents today with similar complaint. Rash has not resolved and it is mildly pruritic, though it does not bother him much. Today I see a brown discoloration on bilateral LE that is not as well demarcated as in previous encounters. This is consistent with venous stasis dermatitis. I discussed the changes in his skin are irreversible and that now we need to focus on preventing complication of venous insufficiency.   - Offered compression stockings, but patient declined - Recommended monitoring for wounds to prevent ulcerations and using moisturizers to prevent dryness  - Hydrocortisone BID PRN for pruritus

## 2020-01-13 NOTE — Assessment & Plan Note (Signed)
Patient has a history of bilateral, nonproliferative DM retinopathy diagnosed in 2016. He denies recent changes in vision. He is not a candidate for our retinal camera exam in clinic and will need further evaluation with ophthalmology. Unfortunately, he is not insured. Recommended appt with financial counselor for [employer].

## 2020-01-13 NOTE — Assessment & Plan Note (Signed)
Continue high intensity atorvastatin 40 mg QD. Refilled today.

## 2020-01-13 NOTE — Progress Notes (Signed)
Internal Medicine Clinic Attending  Case discussed with Dr. Santos-Sanchez at the time of the visit.  We reviewed the resident's history and exam and pertinent patient test results.  I agree with the assessment, diagnosis, and plan of care documented in the resident's note.    

## 2020-01-13 NOTE — Progress Notes (Signed)
   CC: T2DM follow up  HPI:  [name] is a 44 y.o. year-old male with PMH listed below who presents to clinic for T2DM follow up. Please see problem based assessment and plan for further details.   Past Medical History:  Diagnosis Date  . Diabetes mellitus    diagnosed in april when admitted to [facility] with blood glucose of 581. HbA1C was 9.9  . DKA (diabetic ketoacidoses) (HCC)    hospitalized in 03/10/08  . Latent tuberculosis infection    PPD test measured 12 mm on 05/25/08. CXR on 06/03/08 showed no active disease or adenopathy. Started to take isoniazid 300 mg daily. on 06/11/08 and will  take medication  along with Vit  B6 for 9 months.  . Rheumatoid arthritis(714.0)    managed by Dr. Pollyann Savoy. sronegative. Dr. 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   Review of Systems:   Review of Systems  Constitutional: Negative for chills, fever, malaise/fatigue and weight loss.  Gastrointestinal: Negative for abdominal pain, nausea and vomiting.  Genitourinary: Negative for frequency and urgency.  Endo/Heme/Allergies: Negative for polydipsia.    Physical Exam:  Vitals:   01/12/20 1442  BP: 117/80  Pulse: 90  Temp: 98.2 F (36.8 C)  TempSrc: Oral  SpO2: 100%  Weight: 139 lb 14.4 oz (63.5 kg)  Height: 5\' 4"  (1.626 m)    General: well appearing young male in NAD  Cardiac: regular rate and rhythm, nl S1/S2, no murmurs, rubs or gallops Pulm: CTAB, no wheezes or crackles, no increased work of breathing on room air  Ext: warm and well perfused, no peripheral edema, 2+ DP pulses bilaterally  Derm: brown discoloration on bilateral LE consistent with venous stasis dermatitis     Assessment & Plan:   See Encounters Tab for problem based charting.  Patient discussed with Dr. 

## 2020-02-06 ENCOUNTER — Ambulatory Visit: Payer: Self-pay

## 2020-03-16 ENCOUNTER — Encounter: Payer: Self-pay | Admitting: *Deleted

## 2020-04-12 ENCOUNTER — Encounter: Payer: Self-pay | Admitting: Internal Medicine

## 2020-04-26 ENCOUNTER — Encounter: Payer: Self-pay | Admitting: Internal Medicine

## 2020-04-26 ENCOUNTER — Ambulatory Visit (INDEPENDENT_AMBULATORY_CARE_PROVIDER_SITE_OTHER): Payer: Self-pay | Admitting: Internal Medicine

## 2020-04-26 ENCOUNTER — Other Ambulatory Visit: Payer: Self-pay

## 2020-04-26 ENCOUNTER — Other Ambulatory Visit (HOSPITAL_COMMUNITY): Payer: Self-pay | Admitting: Internal Medicine

## 2020-04-26 VITALS — BP 103/57 | HR 95 | Temp 98.2°F | Wt 135.4 lb

## 2020-04-26 DIAGNOSIS — E1165 Type 2 diabetes mellitus with hyperglycemia: Secondary | ICD-10-CM

## 2020-04-26 DIAGNOSIS — E113399 Type 2 diabetes mellitus with moderate nonproliferative diabetic retinopathy without macular edema, unspecified eye: Secondary | ICD-10-CM

## 2020-04-26 DIAGNOSIS — E1140 Type 2 diabetes mellitus with diabetic neuropathy, unspecified: Secondary | ICD-10-CM

## 2020-04-26 DIAGNOSIS — IMO0002 Reserved for concepts with insufficient information to code with codable children: Secondary | ICD-10-CM

## 2020-04-26 DIAGNOSIS — E785 Hyperlipidemia, unspecified: Secondary | ICD-10-CM

## 2020-04-26 LAB — POCT GLYCOSYLATED HEMOGLOBIN (HGB A1C): Hemoglobin A1C: 11.3 % — AB (ref 4.0–5.6)

## 2020-04-26 LAB — GLUCOSE, CAPILLARY: Glucose-Capillary: 441 mg/dL — ABNORMAL HIGH (ref 70–99)

## 2020-04-26 MED ORDER — GLUCOSE BLOOD VI STRP: ORAL_STRIP | 6 refills | Status: DC

## 2020-04-26 MED ORDER — GABAPENTIN 300 MG PO CAPS: 300.0000 mg | ORAL_CAPSULE | Freq: Three times a day (TID) | ORAL | 7 refills | Status: DC

## 2020-04-26 MED ORDER — INSULIN NPH ISOPHANE & REGULAR (70-30) 100 UNIT/ML ~~LOC~~ SUSP: 25.0000 [IU] | Freq: Two times a day (BID) | SUBCUTANEOUS | 12 refills | Status: DC

## 2020-04-26 NOTE — Assessment & Plan Note (Signed)
Patient continues to complain of numbness and tingling to bilateral feet.  Has tried gabapentin in the past intermittently, not fully adherent to it.  I discussed with patient his symptoms are due to peripheral neuropathy from longstanding uncontrolled diabetes and the best course of action is to improve glycemic control and treat neuropathic pain.  He verbalized understanding but states he does not think it is his diabetes.  He feels frustrated and feels like we are not helping him.  I discussed with him that my only goal as his provider is to help him feel better but that in order to do that I needed him to take his medications as prescribed. He again verbalized understanding.   - Start gabapentin 300 mg TID, side effects discussed - Improve glycemic control, see A&P for T2DM

## 2020-04-26 NOTE — Progress Notes (Signed)
° °  CC: Follow up of T2Dm and peripheral neuropathy   HPI:  JasonJason [name] is a 44 y.o. year-old male with PMH listed below who presents to clinic for Follow up of T2Dm and peripheral neuropathy . Please see problem based assessment and plan for further details.   Past Medical History:  Diagnosis Date   Diabetes mellitus    diagnosed in april when admitted to [facility] with blood glucose of 581. HbA1C was 9.9   DKA (diabetic ketoacidoses) (HCC)    hospitalized in 03/10/08   Latent tuberculosis infection    PPD test measured 12 mm on 05/25/08. CXR on 06/03/08 showed no active disease or adenopathy. Started to take isoniazid 300 mg daily. on 06/11/08 and will  take medication  along with Vit  B6 for 9 months.   Rheumatoid arthritis(714.0)    managed by Dr. Pollyann Savoy. sronegative. Dr. 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   Review of Systems:   Review of Systems  Constitutional: Positive for weight loss. Negative for chills, fever and malaise/fatigue.  Respiratory: Negative for shortness of breath.   Cardiovascular: Negative for chest pain.  Gastrointestinal: Negative for abdominal pain, nausea and vomiting.  Genitourinary: Positive for frequency and urgency.  Endo/Heme/Allergies: Positive for polydipsia.    Physical Exam:  Vitals:   04/26/20 1518 04/26/20 1522  BP:  (!) 103/57  Pulse:  95  Temp:  98.2 F (36.8 C)  TempSrc:  Oral  SpO2:  98%  Weight: 135 lb 6.4 oz (61.4 kg)     General: young male, well-appearing, in NAD  Cardiac: regular rate and rhythm, nl S1/S2, no murmurs, rubs or gallops  Pulm: CTAB, no wheezes or crackles, no increased work of breathing on room air  Ext: warm and well perfused, no peripheral edema Skin: appears dry with dry MM    Assessment & Plan:   See Encounters Tab for problem based charting.  Patient discussed with Dr. Oswaldo Done

## 2020-04-26 NOTE — Assessment & Plan Note (Signed)
-   Continue high intensity statin 

## 2020-04-26 NOTE — Patient Instructions (Addendum)
  Seor Ardyth Harps,   Le envie un rellenado de la insulina a la [place]. Es super importante que use su insulina todos los dias y llame a la farmacia o a nosotros cuando se le acabe.   El dolor en sus pies es debido a su diabetes. 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   Haga cita de seguimiento en 3 meses.   - Dra. Evelene Croon

## 2020-04-26 NOTE — Assessment & Plan Note (Signed)
Patient presents today for uncontrolled T2DM follow-up.  He is on Metformin 1000 mg twice daily and NPH 70-30 25 units twice daily. Unfortunately, he is not compliant with his medications and reports he ran out of insulin 2 months ago. I discussed with patient that the last time I sent refills for him in 12/2019 I sent them to last a year and he should have medicine available at his pharmacy. He verbalized understanding. A1c today is 11.2 and BG 441. He reports polydipsia, polyuria, and urinary frequency. He denies confusion, abdominal pain, N/V. I offered IVF and BMP to check for DKA but he declined. I again had a long discussion with him about the complications of uncontrolled DM some of which he is already experiencing. He expresses understanding but I do no think he fully comprehends how this illness is affecting him (see previous note from 12/2019 for more details).   - Continue NPH 70/30 and metformin. Urged him to go pick up insulin today at the pharmacy and encouraged aggressive hydration to help with hyperglycemia symptoms  - will need to continue aggressive education at every visit  - continue high intensity statin  - unable to afford eye exam at this time

## 2020-04-27 NOTE — Progress Notes (Signed)
Internal Medicine Clinic Attending  Case discussed with Dr. Santos-Sanchez at the time of the visit.  We reviewed the resident's history and exam and pertinent patient test results.  I agree with the assessment, diagnosis, and plan of care documented in the resident's note.    

## 2020-05-26 ENCOUNTER — Ambulatory Visit: Payer: Self-pay

## 2020-07-07 MED FILL — HUMULIN 70/30 VIAL: (70-30) 100 | 20 days supply | Qty: 10 | Fill #1

## 2020-12-09 MED FILL — HUMULIN 70/30 VIAL: (70-30) 100 | 20 days supply | Qty: 10 | Fill #2

## 2021-02-23 ENCOUNTER — Other Ambulatory Visit (HOSPITAL_COMMUNITY): Payer: Self-pay

## 2021-02-23 MED FILL — Insulin NPH Isophane & Regular Human Inj 100 Unit/ML (70-30): SUBCUTANEOUS | 20 days supply | Qty: 10 | Fill #0 | Status: AC

## 2021-05-11 ENCOUNTER — Other Ambulatory Visit (HOSPITAL_COMMUNITY): Payer: Self-pay

## 2021-05-11 ENCOUNTER — Other Ambulatory Visit: Payer: Self-pay | Admitting: Internal Medicine

## 2021-05-24 ENCOUNTER — Other Ambulatory Visit: Payer: Self-pay

## 2021-05-24 ENCOUNTER — Other Ambulatory Visit (HOSPITAL_COMMUNITY): Payer: Self-pay

## 2021-05-24 ENCOUNTER — Ambulatory Visit: Payer: Self-pay | Admitting: Student

## 2021-05-24 VITALS — BP 125/75 | HR 97 | Temp 98.8°F | Ht 64.0 in | Wt 142.6 lb

## 2021-05-24 DIAGNOSIS — E113399 Type 2 diabetes mellitus with moderate nonproliferative diabetic retinopathy without macular edema, unspecified eye: Secondary | ICD-10-CM

## 2021-05-24 DIAGNOSIS — E1165 Type 2 diabetes mellitus with hyperglycemia: Secondary | ICD-10-CM

## 2021-05-24 DIAGNOSIS — Z794 Long term (current) use of insulin: Secondary | ICD-10-CM

## 2021-05-24 DIAGNOSIS — E785 Hyperlipidemia, unspecified: Secondary | ICD-10-CM

## 2021-05-24 DIAGNOSIS — IMO0002 Reserved for concepts with insufficient information to code with codable children: Secondary | ICD-10-CM

## 2021-05-24 LAB — POCT GLYCOSYLATED HEMOGLOBIN (HGB A1C): Hemoglobin A1C: 11 % — AB (ref 4.0–5.6)

## 2021-05-24 LAB — GLUCOSE, CAPILLARY: Glucose-Capillary: 236 mg/dL — ABNORMAL HIGH (ref 70–99)

## 2021-05-24 MED ORDER — INSULIN SYRINGES (DISPOSABLE) U-100 1 ML MISC
6 refills | Status: AC
  Filled 2021-05-24: qty 100, fill #0

## 2021-05-24 MED ORDER — INSULIN NPH ISOPHANE & REGULAR (70-30) 100 UNIT/ML ~~LOC~~ SUSP
25.0000 [IU] | Freq: Two times a day (BID) | SUBCUTANEOUS | 12 refills | Status: DC
  Filled 2021-05-24: qty 10, 20d supply, fill #0
  Filled 2021-06-27: qty 10, 20d supply, fill #1

## 2021-05-24 MED ORDER — ATORVASTATIN CALCIUM 40 MG PO TABS
40.0000 mg | ORAL_TABLET | Freq: Every day | ORAL | 3 refills | Status: DC
  Filled 2021-05-24: qty 30, 30d supply, fill #0
  Filled 2021-06-27: qty 30, 30d supply, fill #1
  Filled 2021-08-17: qty 30, 30d supply, fill #2

## 2021-05-24 MED ORDER — MICROLET LANCETS MISC
6 refills | Status: DC
  Filled 2021-05-24: qty 100, 30d supply, fill #0

## 2021-05-24 MED ORDER — GLUCOSE BLOOD VI STRP
ORAL_STRIP | 6 refills | Status: DC
  Filled 2021-05-24: qty 100, 30d supply, fill #0

## 2021-05-24 MED ORDER — LISINOPRIL 2.5 MG PO TABS
2.5000 mg | ORAL_TABLET | Freq: Every day | ORAL | 5 refills | Status: DC
  Filled 2021-05-24: qty 30, 30d supply, fill #0

## 2021-05-24 MED ORDER — "INSULIN SYRINGE-NEEDLE U-100 31G X 1/4"" 1 ML MISC"
4 refills | Status: AC
  Filled 2021-05-24: qty 50, 25d supply, fill #0

## 2021-05-24 MED ORDER — METFORMIN HCL 1000 MG PO TABS
1000.0000 mg | ORAL_TABLET | Freq: Two times a day (BID) | ORAL | 5 refills | Status: DC
  Filled 2021-05-24: qty 60, 30d supply, fill #0

## 2021-05-24 NOTE — Assessment & Plan Note (Signed)
Reordered high intensity statin

## 2021-05-24 NOTE — Progress Notes (Signed)
CC: Hyperglycemia, diabetic neuropathy  HPI:  Jason [name] is a 45 y.o. male with a past medical history stated below and presents today for follow-up concerning his uncontrolled type 2 diabetes. Please see problem based assessment and plan for additional details.  Past Medical History:  Diagnosis Date   Diabetes mellitus    diagnosed in april when admitted to [facility] with blood glucose of 581. HbA1C was 9.9   DKA (diabetic ketoacidoses) (HCC)    hospitalized in 03/10/08   Latent tuberculosis infection    PPD test measured 12 mm on 05/25/08. CXR on 06/03/08 showed no active disease or adenopathy. Started to take isoniazid 300 mg daily. on 06/11/08 and will  take medication  along with Vit  B6 for 9 months.   Rheumatoid arthritis(714.0)    managed by Dr. Pollyann Savoy. sronegative. Dr. 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    Current Outpatient Medications on File Prior to Visit  Medication Sig Dispense Refill   Blood Glucose Monitoring Suppl (AGAMATRIX PRESTO PRO METER) DEVI 1 Units by Does not apply route 3 (three) times daily before meals. 1 Device 0   gabapentin (NEURONTIN) 300 MG capsule Take 1 capsule (300 mg total) by mouth 3 (three) times daily. 90 capsule 7   lisinopril (PRINIVIL,ZESTRIL) 2.5 MG tablet Take 1 tablet (2.5 mg total) by mouth daily. IM program. 30 tablet 5   No current facility-administered medications on file prior to visit.    Family History  Problem Relation Age of Onset   Diabetes Cousin    Hypertension Cousin    Diabetes Mother     Social History   Socioeconomic History   Marital status: Single    Spouse name: Not on file   Number of children: Not on file   Years of education: 9   Highest education level: Not on file  Occupational History    Employer: [employer]  Tobacco Use   Smoking status: Never   Smokeless tobacco: Never  Substance and Sexual Activity   Alcohol  use: No   Drug use: No   Sexual activity: Not on file  Other Topics Concern   Not on file  Social History Narrative   Works Holiday representative jobs occasionally, 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   Social Determinants of Health   Financial Resource Strain: Not on file  Food Insecurity: Not on file  Transportation Needs: Not on file  Physical Activity: Not on file  Stress: Not on file  Social Connections: Not on file  Intimate Partner Violence: Not on file    Review of Systems: ROS negative except for what is noted on the assessment and plan.  Vitals:   05/24/21 1531  BP: 125/75  Pulse: 97  Temp: 98.8 F (37.1 C)  TempSrc: Oral  SpO2: 99%  Weight: 142 lb 9.6 oz (64.7 kg)  Height: 5\' 4"  (1.626 m)     Physical Exam: Constitutional: well-appearing, sitting in chair comfortably, no acute distress HENT: normocephalic atraumatic, mucous membranes moist Eyes: conjunctiva non-erythematous Neck: supple Cardiovascular: regular rate and rhythm, no m/r/g Pulmonary/Chest: normal work of breathing on room air, lungs clear to auscultation bilaterally Abdominal: soft, non-tender, non-distended MSK: normal bulk and tone Neurological: alert & oriented x 3 Skin: warm and dry Psych: Normal mood and thought process   Assessment & Plan:   See Encounters Tab for problem based charting.  Patient discussed with Dr. ,  D.OTressie [facility], PGY-1 Pager: [phone], Phone: [phone] Date 05/24/2021 Time 5:59 PM

## 2021-05-24 NOTE — Patient Instructions (Addendum)
Jason [name], Jason [name] por permitirnos brindarle atencin hoy. 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 Recoja las recargas y vistenos en tres meses. Si no ha recibido [employer] para una cita, por favor llame. Por favor, tambin revise sus pies con frecuencia.  He ordenado los siguientes laboratorios para usted:  Lab Orders  Glucose, capillary  BMP8+Anion Gap  POC Hbg A1C    Pruebas ordenadas hoy:  Referencias ordenadas hoy:  Referral Orders  No referral(s) requested today     He ordenado el siguiente medicamento/cambiado los siguientes medicamentos:  Suspender los siguientes medicamentos: Medications Discontinued During This Encounter  Medication Reason   insulin NPH-regular Human (70-30) 100 UNIT/ML injection    Insulin Syringe-Needle U-100 31G X 3/8" 0.5 ML MISC    Insulin Syringes, Disposable, U-100 1 ML MISC Reorder   Lancets MISC Reorder   metFORMIN (GLUCOPHAGE) 1000 MG tablet Reorder   atorvastatin (LIPITOR) 40 MG tablet Reorder   insulin NPH-regular Human (70-30) 100 UNIT/ML injection Reorder   glucose blood test strip Reorder     Iniciar los siguientes medicamentos: Meds ordered this encounter  Medications   atorvastatin (LIPITOR) 40 MG tablet    Sig: Take 1 tablet (40 mg total) by mouth daily.    Dispense:  90 tablet    Refill:  3    IM program   metFORMIN (GLUCOPHAGE) 1000 MG tablet    Sig: Take 1 tablet (1,000 mg total) by mouth 2 (two) times daily with a meal. IM program.    Dispense:  60 tablet    Refill:  5    IM Program   Lancets MISC    Sig: Use to check blood glucose 3 times a day before meals. IM program.    Dispense:  100 each    Refill:  6   Insulin Syringes, Disposable, U-100 1 ML MISC    Sig: Use to inject insulin twice daily. IM program.    Dispense:  100 each    Refill:  6    IM program   insulin NPH-regular  Human (70-30) 100 UNIT/ML injection    Sig: Inject 25 Units into the skin 2 (two) times daily. IM program.    Dispense:  10 mL    Refill:  12    IM Program   glucose blood test strip    Sig: Use to check blood glucose 3 times a day. IM program.    Dispense:  100 each    Refill:  6    The patient is insulin requiring, ICD 10 code E11.65. The patient tests 3 times per day. IM Program. 0   Insulin Syringe-Needle U-100 31G X 15/64" 0.5 ML MISC    Sig: Please use twice daily when injecting insulin    Dispense:  100 each    Refill:  4    IM program      Seguimiento 3 months for diabetes recheck   Si tiene alguna pregunta o inquietud, llame a la clnica de medicina interna al [phone].     Thalia Bloodgood, D.O. [facility]

## 2021-05-24 NOTE — Assessment & Plan Note (Addendum)
Assessment: Mr. Harshfield has a history of uncontrolled type 2 diabetes.  His medication regimen is metformin 1000 mg twice daily and NPH 70 3025 units twice daily.  Fortunately he has not been adherent to this medication regimen and has been out of his medications for the past few months.  He notes that since this time he has been waking up every morning with nausea and episodes of vomiting.  He notes that he has difficulty keeping foods down.  He also states that he has had worsening numbness and shooting pains in his lower extremities.  When asked if he is frequently thirsty he states sometimes.  When asked if he urinates frequently he notes not that much.  States he urinates 3 times a day.  He notes that the gabapentin was working but he has been out of this medication as well for the past few months.  Discussed with him the importance of being adherent to his medication.  A1c today of 11.  When asked why he did not follow-up or call when he was out of his meds he noted that he was told someone would call him and he never received a call for a follow-up.  Reemphasized the importance of managing his diabetes to prevent damage to his kidneys, his eyes, and to prevent other organ damage as well.  I believe that his symptoms as per above are from his uncontrolled diabetes.  We will start by restarting his home medications.  We will also order BMP at this time to assess his kidney function.   Plan: -Reordered diabetes supplies and metformin at 1000 mg twice daily and NPH 70/30 25 units twice daily.  We will hold off on reordering lisinopril for now. -BMP pending -Repeat A1c in 3 months and follow-up with medication adherence  Addendum:  Cr stable at .6.

## 2021-05-25 ENCOUNTER — Other Ambulatory Visit (HOSPITAL_COMMUNITY): Payer: Self-pay

## 2021-05-25 LAB — BMP8+ANION GAP
Anion Gap: 14 mmol/L (ref 10.0–18.0)
BUN/Creatinine Ratio: 19 (ref 9–20)
BUN: 13 mg/dL (ref 6–24)
CO2: 25 mmol/L (ref 20–29)
Calcium: 9.1 mg/dL (ref 8.7–10.2)
Chloride: 97 mmol/L (ref 96–106)
Creatinine, Ser: 0.69 mg/dL — ABNORMAL LOW (ref 0.76–1.27)
Glucose: 230 mg/dL — ABNORMAL HIGH (ref 65–99)
Potassium: 4.2 mmol/L (ref 3.5–5.2)
Sodium: 136 mmol/L (ref 134–144)
eGFR: 118 mL/min/{1.73_m2} (ref >59)

## 2021-05-27 NOTE — Addendum Note (Signed)
Addended by: Belva Agee on: 05/27/2021 09:44 AM   Modules accepted: Orders

## 2021-05-27 NOTE — Progress Notes (Signed)
Internal Medicine Clinic Attending  Case discussed with Dr. Katsadouros  At the time of the visit.  We reviewed the resident's history and exam and pertinent patient test results.  I agree with the assessment, diagnosis, and plan of care documented in the resident's note.  

## 2021-06-22 ENCOUNTER — Encounter: Payer: Self-pay | Admitting: Pharmacist

## 2021-06-27 ENCOUNTER — Encounter: Payer: Self-pay | Admitting: Student

## 2021-06-27 ENCOUNTER — Ambulatory Visit: Payer: Self-pay | Admitting: Student

## 2021-06-27 ENCOUNTER — Other Ambulatory Visit (HOSPITAL_COMMUNITY): Payer: Self-pay

## 2021-06-27 ENCOUNTER — Other Ambulatory Visit: Payer: Self-pay

## 2021-06-27 ENCOUNTER — Other Ambulatory Visit: Payer: Self-pay | Admitting: *Deleted

## 2021-06-27 ENCOUNTER — Ambulatory Visit (INDEPENDENT_AMBULATORY_CARE_PROVIDER_SITE_OTHER): Payer: Self-pay | Admitting: Pharmacist

## 2021-06-27 VITALS — BP 123/84 | HR 91 | Temp 98.8°F | Ht 64.0 in | Wt 141.9 lb

## 2021-06-27 DIAGNOSIS — IMO0002 Reserved for concepts with insufficient information to code with codable children: Secondary | ICD-10-CM

## 2021-06-27 DIAGNOSIS — K529 Noninfective gastroenteritis and colitis, unspecified: Secondary | ICD-10-CM

## 2021-06-27 DIAGNOSIS — E1165 Type 2 diabetes mellitus with hyperglycemia: Secondary | ICD-10-CM

## 2021-06-27 DIAGNOSIS — E113399 Type 2 diabetes mellitus with moderate nonproliferative diabetic retinopathy without macular edema, unspecified eye: Secondary | ICD-10-CM

## 2021-06-27 DIAGNOSIS — Z794 Long term (current) use of insulin: Secondary | ICD-10-CM

## 2021-06-27 MED ORDER — DAPAGLIFLOZIN PROPANEDIOL 5 MG PO TABS
5.0000 mg | ORAL_TABLET | Freq: Every day | ORAL | 3 refills | Status: DC
  Filled 2021-06-27: qty 30, 30d supply, fill #0

## 2021-06-27 NOTE — Patient Instructions (Addendum)
[name], fue un placer verlo hoy.  Por favor haz lo siguiente:  1. Aumente su insulina a 25 [employer] veces al [employer] se le indic hoy durante su cita. Si tiene alguna pregunta o si cree que ha ocurrido algo debido a este cambio, llmeme a m oa su mdico para informarnos. 2. Contine controlando los niveles de [place] en casa. Es muy importante que los registre y los lleve a su prxima cita con el mdico. 3. Contine haciendo los cambios de estilo de vida que hemos discutido juntos durante nuestra visita. La dieta y el ejercicio juegan un papel importante en la mejora de los niveles de azcar en la [place]. 4. Haga un seguimiento conmigo en dos o tres semanas.   Hipoglucemia o nivel bajo de azcar en la sangre:  El nivel bajo de azcar en la sangre puede ocurrir rpidamente y convertirse en una emergencia si no se trata de inmediato.  Si bien esto no debera suceder con frecuencia, puede ocurrir si se salta una comida o no come lo suficiente. Adems, si su insulina u otros medicamentos para la diabetes tienen una dosis demasiado alta, esto puede hacer que su nivel de azcar en la sangre baje.  Las seales de advertencia de niveles bajos de azcar en la sangre incluyen: 1. Sentirse tembloroso o mareado 2. Sentirse dbil o cansado 3. Hambre excesiva 4. Sentirse ansioso o molesto 5. Sudar incluso cuando no haces ejercicio  Qu hacer si tengo un nivel bajo de azcar en la sangre? Sigue la regla del 15 1. Controle su nivel de azcar en la sangre con su medidor. Si es inferior a 70, contine con el paso 2. 2. Trate con 15 gramos de carbohidratos de accin rpida que se encuentran en 3-4 tabletas de glucosa. Si no hay ninguno disponible, puede probar con caramelos duros, 1 cucharada de azcar o miel, 4 onzas de jugo de frutas o 6 onzas de refresco NORMAL. 3. Vuelva a controlar su nivel de azcar en 15 minutos. Si todava est por debajo de 70, vuelva a hacer lo que hizo en  el paso 2. Si su nivel de azcar en la sangre ha vuelto a subir, contine y coma un refrigerio o una comida pequea compuesta de carbohidratos complejos (p. ej., granos integrales) y protenas en este momento para evitar la recurrencia del nivel bajo de azcar en la sangre.  Jason [name] it was a pleasure seeing you today.   Please do the following:  Increase your insulin to 25 units twice a day as directed today during your appointment. If you have any questions or if you believe something has occurred because of this change, call me or your doctor to let one of [place] know.  Continue checking blood sugars at home. It's really important that you record these and bring these in to your next doctor's appointment.  Continue making the lifestyle changes we've discussed together during our visit. Diet and exercise play a significant role in improving your blood sugars.  Follow-up with me in two to three weeks.    Hypoglycemia or low blood sugar:   Low blood sugar can happen quickly and may become an emergency if not treated right away.   While this shouldn't happen often, it can be brought upon if you skip a meal or do not eat enough. Also, if your insulin or other diabetes medications are dosed too high, this can cause your blood sugar to go to low.   Warning  signs of low blood sugar include: Feeling shaky or dizzy Feeling weak or tired  Excessive hunger Feeling anxious or upset  Sweating even when you aren't exercising  What to do if I experience low blood sugar? Follow the Rule of 15 Check your blood sugar with your meter. If lower than 70, proceed to step 2.  Treat with 15 grams of fast acting carbs which is found in 3-4 glucose tablets. If none are available you can try hard candy, 1 tablespoon of sugar or honey,4 ounces of fruit juice, or 6 ounces of REGULAR soda.  Re-check your sugar in 15 minutes. If it is still below 70, do what you did in step 2 again. If your blood sugar has come back  up, go ahead and eat a snack or small meal made up of complex carbs (ex. Whole grains) and protein at this time to avoid recurrence of low blood sugar.

## 2021-06-27 NOTE — Progress Notes (Signed)
Subjective:    Patient ID: Jason [name], male    DOB: Aug 14, 1976, 45 y.o.   MRN: [id number]  HPI Patient is a 45 y.o. male who presents for diabetes management. He is in good spirits and presents without assistance with Spanish Intepreter present. Patient was referred and last seen by Primary Care Provider on 05/24/21.  Patient reports that his blood glucose has been "kind of high." He does not check frequently but did this morning before coming to appt and states it was 317. Patient states he is struggling with controlling his diabetes as he has been vomiting daily for the past several months. Patient reports this has not improved despite being back on diabetes medications.  Patient reports diabetes was diagnosed around 15 years ago.   Insurance coverage/medication affordability: self-pay  Current diabetes medications include: insulin NPH 70/30 25 units in AM and 20 units in PM, metformin 1000mg  BID Current hyperlipidemia medications include: atorvastatin 40mg  Patient states that He is taking his medications as prescribed. Patient denies adherence with medications. Patient states that He misses his medications 2 times per week, on average.  Do you feel that your medications are working for you?  no  Have you been experiencing any side effects to the medications prescribed? no  Do you have any problems obtaining medications due to transportation or finances?  Yes; self-pay    Patient denies hypoglycemic events. Patient reports polyuria (increased urination).  Patient denies polyphagia (increased appetite).  Patient reports polydipsia (increased thirst).  Patient reports neuropathy (nerve pain). Patient reports visual changes; denies seeing eye doctor recently Patient reports self foot exams.   Home fasting blood sugars: 200-300's   Objective:   Labs:   Physical Exam Neurological:     Mental Status: He is alert and oriented to person, place, and time.    Review of Systems   Gastrointestinal:  Positive for diarrhea, nausea and vomiting.   Lab Results  Component Value Date   HGBA1C 11.0 (A) 05/24/2021   HGBA1C 11.3 (A) 04/26/2020   HGBA1C 11.9 (A) 01/12/2020   Lab Results  Component Value Date   MICRALBCREAT 278.6 (H) 06/07/2018    Lipid Panel     Component Value Date/Time   CHOL 206 (H) 12/24/2018 1117   TRIG 256 (H) 12/24/2018 1117   HDL 43 12/24/2018 1117   CHOLHDL 4.8 12/24/2018 1117   CHOLHDL 3.4 11/08/2013 0655   VLDL 17 11/08/2013 0655   LDLCALC 112 (H) 12/24/2018 1117    Assessment/Plan:   T2DM is not controlled likely due to sub-optimal medication adherence. Discussed with patient importance of taking medication daily and not missing any doses as this will likely lead to elevated blood glucose. Additional pharmacotherapy is warranted. GLP1 not appropriate at this time due to patient experiencing chronic nausea/vomiting. Will initiate Farxiga 5mg  once daily. Will also have patient take prescribed dose of insulin NPH 70/30 which is 25 units BID rather than only 20 units in evening. Patient educated on purpose, proper use and potential adverse effects of 11/10/2013.  Following instruction patient verbalized understanding of treatment plan. Patient has appointment with provider following this visit in which a work up for nausea/vomiting will be performed.  Increased dose of basal insulin insulin NPH 70/30 to 25mg  BID Started SGLT2-I dapagliflozin 02/22/2019) 5mg  once daily  Extensively discussed pathophysiology of diabetes, dietary effects on blood sugar control, and recommended lifestyle interventions. Counseled on s/sx of and management of hypoglycemia Next A1C anticipated October 2022.   Follow-up appointment 2-3 weeks  to review sugar readings. Written patient instructions provided.  This appointment required 25 minutes of direct patient care.  Thank you for involving pharmacy to assist in providing this patient's care.

## 2021-06-27 NOTE — Progress Notes (Deleted)
Subjective:    Patient ID: Jason [name], male    DOB: 16-Nov-1976, 45 y.o.   MRN: [id number]  HPI Patient is a 45 y.o. male who presents for diabetes management. He is in good spirits and presents without assistance with Spanish Intepreter present. Patient was referred and last seen by Primary Care Provider on 05/24/21.  "Kind of high" Does not check often; checked this morning it was 317  PMH significant for ***.   Patient reports diabetes was diagnosed around 15 years ago.   Insurance coverage/medication affordability: self-pay  Family/Social history: ***  Current diabetes medications include: insulin NPH 70/30 25 units in AM and 20 units in PM, metformin 1000mg  BID Current hyperlipidemia medications include: atorvastatin 40mg  Patient states that {He/she (caps):30048} {Is/is not:9024} taking {his/her/their:21314} medications as prescribed. Patient {Actions; denies-reports:120008} adherence with medications. Patient states that {He/she (caps):30048} misses {his/her/their:21314} medications 2 days times per week, on average.  Do you feel that your medications are working for you?  no  Have you been experiencing any side effects to the medications prescribed? no  Do you have any problems obtaining medications due to transportation or finances?  {YES     Patient reported dietary habits:  Eats *** meals/day and *** snacks/day; [place] with *** meals/day and *** snacks/day Breakfast:*** Lunch:*** Dinner:*** Snacks:*** Drinks:***   Patient-reported exercise habits: ***   Patient denies hypoglycemic events. Patient reports polyuria (increased urination).  Patient denies polyphagia (increased appetite).  Patient reports polydipsia (increased thirst).  Patient reports neuropathy (nerve pain). Patient reports visual changes; denies seeing eye doctor recently Patient {Actions; denies-reports:120008} self foot exams.   Home fasting blood sugars: 200-300's  2 hour  post-meal/random blood sugars: ***  Vomiting constantly  Objective:   Labs:   Physical Exam Neurological:     Mental Status: He is alert and oriented to person, place, and time.    Review of Systems  Gastrointestinal:  Positive for diarrhea, nausea and vomiting.   Lab Results  Component Value Date   HGBA1C 11.0 (A) 05/24/2021   HGBA1C 11.3 (A) 04/26/2020   HGBA1C 11.9 (A) 01/12/2020    There were no vitals filed for this visit.  Lab Results  Component Value Date   MICRALBCREAT 278.6 (H) 06/07/2018    Lipid Panel     Component Value Date/Time   CHOL 206 (H) 12/24/2018 1117   TRIG 256 (H) 12/24/2018 1117   HDL 43 12/24/2018 1117   CHOLHDL 4.8 12/24/2018 1117   CHOLHDL 3.4 11/08/2013 0655   VLDL 17 11/08/2013 0655   LDLCALC 112 (H) 12/24/2018 1117    Clinical Atherosclerotic Cardiovascular Disease (ASCVD): {YES/NO:21197} The 10-year ASCVD risk score 11/10/2013 DC Jr., et al., 2013) is: 3.4%   Values used to calculate the score:     Age: 37 years     Sex: Male     Is Non-Hispanic African American: No     Diabetic: Yes     Tobacco smoker: No     Systolic Blood Pressure: 125 mmHg     Is BP treated: No     HDL Cholesterol: 43 mg/dL     Total Cholesterol: 206 mg/dL   PHQ-9 Score: ***  Assessment/Plan:   ***T1/T2DM {ACTION; IS/IS 2014 controlled likely due to ***. Medication adherence appears ***. Additional pharmacotherapy {ACTION; IS/IS 45. Patient with a history of intolerance to ***. Will initiate ***. Benefits of medication include ***. Patient educated on purpose, proper use and potential adverse effects of ***.  Following instruction patient  verbalized understanding of treatment plan.    {Meds adjust:18428} basal insulin *** (insulin ***). Patient will continue to titrate 1 unit every *** days if fasting blood sugar > 100mg /dl until fasting blood sugars reach goal or next visit. {Meds adjust:18428}  rapid insulin *** (insulin ***) to ***.   {Meds adjust:18428} GLP-1 *** (generic name***) to ***.  {Meds adjust:18428} SGLT2-I *** (generic name***) to ***. Counseled on sick day rules for ***. Extensively discussed pathophysiology of diabetes, dietary effects on blood sugar control, and recommended lifestyle interventions,  Patient will adhere to dietary modifications Patient will exercise *** with goal to increase towards target of at least 150 minutes of moderate intensity exercise weekly Counseled on s/sx of and management of hypoglycemia Next A1C anticipated ***.   ASCVD risk - primary***secondary prevention in patient with diabetes. Last LDL {Is/is not:9024} controlled. ASCVD risk score {Is/is not:9024} >20%  - {Desc; low/moderate/high:110033} intensity statin indicated. Aspirin {Is/is not:9024} indicated.   {Meds adjust:18428} aspirin *** mg  {Meds adjust:18428} ***statin *** mg.   Hypertension longstanding*** currently ***.  Blood pressure goal = *** mmHg. Medication adherence ***.  Blood pressure control is suboptimal due to ***.  *** Extensively discussed pathophysiology of blood pressure, dietary effects on blood pressure control, and recommended lifestyle interventions  Follow-up appointment *** to review sugar readings. Written patient instructions provided.  This appointment required *** minutes of patient care (this includes precharting, chart review, review of results, and face-to-face care).  Thank you for involving pharmacy to assist in providing this patient's care.  Patient seen with ***

## 2021-06-27 NOTE — Assessment & Plan Note (Signed)
  T2DM is not controlled likely due to sub-optimal medication adherence. Discussed with patient importance of taking medication daily and not missing any doses as this will likely lead to elevated blood glucose. Additional pharmacotherapy is warranted. GLP1 not appropriate at this time due to patient experiencing chronic nausea/vomiting. Will initiate Farxiga 5mg  once daily. Will also have patient take prescribed dose of insulin NPH 70/30 which is 25 units BID rather than only 20 units in evening. Patient educated on purpose, proper use and potential adverse effects of .  Following instruction patient verbalized understanding of treatment plan. Patient has appointment with provider following this visit in which a work up for nausea/vomiting will be performed.  1. Increased dose of basal insulin insulin NPH 70/30 to 25mg  BID 2. Started SGLT2-I dapagliflozin [place]) 5mg  once daily  3. Extensively discussed pathophysiology of diabetes, dietary effects on blood sugar control, and recommended lifestyle interventions. 4. Counseled on s/sx of and management of hypoglycemia 5. Next A1C anticipated October 2022.   Follow-up appointment 2-3 weeks to review sugar readings. Written patient instructions provided.

## 2021-06-27 NOTE — Patient Instructions (Addendum)
Mr. Eastridge it was a pleasure seeing you today.   Regarding your persistent nausea, vomiting, and diarrhea. It is likely that your symptoms are still due to your poorly controlled diabetes. However, there could be other reasons why you are having persistent vomiting and diarrhea. We will check a few labs today. You will likely need to be referred to the GI doctors eventually, however it is important for you to complete the paperwork for your orange card so this will be affordable.   Please do the following:  Increase/Decrease  as directed today during your appointment. If you have any questions or if you believe something has occurred because of this change, call me or your doctor to let one of [place] know.  Continue checking blood sugars at home. It's really important that you record these and bring these in to your next doctor's appointment.  Continue making the lifestyle changes we've discussed together during our visit. Diet and exercise play a significant role in improving your blood sugars.  Follow-up with me/PCP in .    Hypoglycemia or low blood sugar:   Low blood sugar can happen quickly and may become an emergency if not treated right away.   While this shouldn't happen often, it can be brought upon if you skip a meal or do not eat enough. Also, if your insulin or other diabetes medications are dosed too high, this can cause your blood sugar to go to low.   Warning signs of low blood sugar include: Feeling shaky or dizzy Feeling weak or tired  Excessive hunger Feeling anxious or upset  Sweating even when you aren't exercising  What to do if I experience low blood sugar? Follow the Rule of 15 Check your blood sugar with your meter. If lower than 70, proceed to step 2.  Treat with 15 grams of fast acting carbs which is found in 3-4 glucose tablets. If none are available you can try hard candy, 1 tablespoon of sugar or honey,4 ounces of fruit juice, or 6 ounces of REGULAR soda.   Re-check your sugar in 15 minutes. If it is still below 70, do what you did in step 2 again. If your blood sugar has come back up, go ahead and eat a snack or small meal made up of complex carbs (ex. Whole grains) and protein at this time to avoid recurrence of low blood sugar.

## 2021-06-28 DIAGNOSIS — K529 Noninfective gastroenteritis and colitis, unspecified: Secondary | ICD-10-CM | POA: Insufficient documentation

## 2021-06-28 LAB — CMP14 + ANION GAP
ALT: 95 IU/L — ABNORMAL HIGH (ref 0–44)
AST: 58 IU/L — ABNORMAL HIGH (ref 0–40)
Albumin/Globulin Ratio: 1.5 (ref 1.2–2.2)
Albumin: 4.7 g/dL (ref 4.0–5.0)
Alkaline Phosphatase: 201 IU/L — ABNORMAL HIGH (ref 44–121)
Anion Gap: 15 mmol/L (ref 10.0–18.0)
BUN/Creatinine Ratio: 23 — ABNORMAL HIGH (ref 9–20)
BUN: 14 mg/dL (ref 6–24)
Bilirubin Total: 0.3 mg/dL (ref 0.0–1.2)
CO2: 22 mmol/L (ref 20–29)
Calcium: 9.5 mg/dL (ref 8.7–10.2)
Chloride: 101 mmol/L (ref 96–106)
Creatinine, Ser: 0.61 mg/dL — ABNORMAL LOW (ref 0.76–1.27)
Globulin, Total: 3.1 g/dL (ref 1.5–4.5)
Glucose: 198 mg/dL — ABNORMAL HIGH (ref 65–99)
Potassium: 4.2 mmol/L (ref 3.5–5.2)
Sodium: 138 mmol/L (ref 134–144)
Total Protein: 7.8 g/dL (ref 6.0–8.5)
eGFR: 123 mL/min/{1.73_m2} (ref >59)

## 2021-06-28 LAB — TSH: TSH: 1.08 u[IU]/mL (ref 0.450–4.500)

## 2021-06-28 NOTE — Assessment & Plan Note (Addendum)
Patient was recently seen in clinic regarding his nausea and vomiting.  At that time it was thought that his symptoms were secondary to his poorly controlled diabetes and likely due to gastroparesis.  The importance of adhering to his insulin regimen was emphasized.  On today's clinic visit, patient reports that he has been experiencing nausea, vomiting, and diarrhea for approximately 2 to 3 months.  He states that these episodes frequently occur after he eats both solids and liquids. He notes that the food is able to pass through his esophagus, however he begins to develop an "upset" stomach and then he vomits. During the episodes of emesis he has epigastric pain in the abdomen but this is self limiting. He denies any blood in his vomitus. He does have a cold then hot sensation that accompanies these episodes.  Patient also reports diarrhea that has started during this time.  He states that he has 4-5 episodes of diarrhea each day.  He also notes diarrhea that wakes him up at nighttime.  He denies any blood in his stools.  A/P: Patient's persistent vomiting and diarrhea likely multifactorial in etiology.  Patient does not have any alarm signs in that his weight is stable, he denies blood in his vomitus or diarrhea, he denies dysphagia or odynophagia, progressively worsening abdominal pain. He does however note nocturnal diarrhea which awake him from his sleep.   Considered inflammatory bowel disease, endocrine, and infectious causes for patient's symptoms.  Patient's TSH was within normal limits. He does have a transaminitis on his CMP indicating hepatitis could be contributing to his symptoms. Although would expect his transaminitis to be significantly higher and for patient to have hyperbilirubinemia. A GI pathogen panel was not initially obtained given potentially being cost prohibitive. However will follow up with patient to see if his symptoms have improved or worsened and order these as the lab found a  way to have his costs reduced.  Also considered celiac disease as his diarrhea is related to ingestion of food. Patient's uncontrolled diabetes could also be contributing to his vomiting and diarrhea. See diabetes tab for further management of this.  -Plan for celiac serologies, GI pathogen panel, CRP, CBC, and fecal calprotetctin

## 2021-06-28 NOTE — Progress Notes (Signed)
Internal Medicine Clinic Attending  I saw and evaluated the patient.  I personally confirmed the key portions of the history and exam documented by Dr. Montez Morita and I reviewed pertinent patient test results.  The assessment, diagnosis, and plan were formulated together and I agree with the documentation in the resident's note.   On reviewing the resident's note, I did not realize that patient was inconsistently taking his metformin. He is prescribed 1,000 mg BID. I suspect his GI symptoms are all related to his metformin. Recommend having patient hold metformin to see if symptoms improve before pursuing further work up.   Lastly, per pharmacy note, patient was only taking 25 units of 70/30 insulin in the morning and 20 units in the evening. However per Dr. Celene Skeen note he was taking 25 BID (this is consistent with prior notes). He also reported that he was only intermittently taking his insulin. We will need to clarify exactly how much insulin he was taking and how often. Would not recommend increasing if he is infrequently using. Need to stress compliance and follow up with his glucometer.

## 2021-06-28 NOTE — Progress Notes (Signed)
   CC: Follow up regarding vomiting and diarrhea  HPI:  [name] is a 45 y.o. M with a PMH of poorly controlled T2DM (most recent A1c 11)  Past Medical History:  Diagnosis Date   Diabetes mellitus    diagnosed in april when admitted to [facility] with blood glucose of 581. HbA1C was 9.9   DKA (diabetic ketoacidoses)    hospitalized in 03/10/08   Latent tuberculosis infection    PPD test measured 12 mm on 05/25/08. CXR on 06/03/08 showed no active disease or adenopathy. Started to take isoniazid 300 mg daily. on 06/11/08 and will  take medication  along with Vit  B6 for 9 months.   Rheumatoid arthritis(714.0)    managed by Dr. Pollyann Savoy. sronegative. Dr. 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   Review of Systems: Please see assessment and plan under notes tab for further details.   Physical Exam:  Vitals:   06/27/21 1008  BP: 123/84  Pulse: 91  Temp: 98.8 F (37.1 C)  TempSrc: Oral  SpO2: 98%  Weight: 141 lb 14.4 oz (64.4 kg)  Height: 5\' 4"  (1.626 m)   Gen: No acute distress CV: RRR, no murmurs  Pulm: Non labored breathing on RA, no wheezing or crackles  Abd: Soft, NT, ND  Ext: No edema of BLE  Neuro: Non focal, grossly intact  Assessment & Plan:   See Encounters Tab for problem based charting.  Patient seen with Dr. 

## 2021-06-28 NOTE — Assessment & Plan Note (Signed)
Patient's diabetes is poorly controlled.  His last A1c was 11.  He states that he is intermittently adherent to his insulin and metformin regimen.  He states he often goes "a day or 2" without taking his insulin.  Patient patient does not frequently check his blood glucose.  His current regimen includes for 25 units twice daily for NPH-regular insulin and metformin 1000 mg twice daily.  Assessment and plan: Patient is likely underestimating how many doses of insulin he misses.  We will not increase his insulin regiment as he infrequently checks his blood glucoses.  We will add Marcelline Deist to his current antidiabetic regimen.

## 2021-07-18 ENCOUNTER — Encounter: Payer: Self-pay | Admitting: Pharmacist

## 2021-07-19 NOTE — Progress Notes (Signed)
Submitted application for FARXIGA 5MG  to [place] for patient assistance.   Phone: [phone]

## 2021-07-27 ENCOUNTER — Other Ambulatory Visit: Payer: Self-pay

## 2021-07-27 ENCOUNTER — Ambulatory Visit (INDEPENDENT_AMBULATORY_CARE_PROVIDER_SITE_OTHER): Payer: Self-pay | Admitting: Pharmacist

## 2021-07-27 DIAGNOSIS — E1165 Type 2 diabetes mellitus with hyperglycemia: Secondary | ICD-10-CM

## 2021-07-27 DIAGNOSIS — K529 Noninfective gastroenteritis and colitis, unspecified: Secondary | ICD-10-CM

## 2021-07-27 DIAGNOSIS — E113399 Type 2 diabetes mellitus with moderate nonproliferative diabetic retinopathy without macular edema, unspecified eye: Secondary | ICD-10-CM

## 2021-07-27 DIAGNOSIS — IMO0002 Reserved for concepts with insufficient information to code with codable children: Secondary | ICD-10-CM

## 2021-07-27 MED ORDER — FIASP FLEXTOUCH 100 UNIT/ML ~~LOC~~ SOPN
15.0000 [IU] | PEN_INJECTOR | Freq: Two times a day (BID) | SUBCUTANEOUS | 0 refills | Status: DC
  Filled 2021-07-27: qty 15, fill #0

## 2021-07-27 MED ORDER — INSULIN DEGLUDEC 100 UNIT/ML ~~LOC~~ SOPN
20.0000 [IU] | PEN_INJECTOR | Freq: Every day | SUBCUTANEOUS | 0 refills | Status: DC
  Filled 2021-07-27 – 2021-08-17 (×3): qty 6, 30d supply, fill #0
  Filled 2021-08-17: qty 6, 30d supply, fill #1

## 2021-07-27 MED ORDER — NOVOLOG FLEXPEN 100 UNIT/ML ~~LOC~~ SOPN
15.0000 [IU] | PEN_INJECTOR | Freq: Two times a day (BID) | SUBCUTANEOUS | 0 refills | Status: DC
  Filled 2021-07-27: qty 9, 30d supply, fill #0
  Filled 2021-08-17: qty 6, 20d supply, fill #1
  Filled 2021-08-17 (×2): qty 6, 20d supply, fill #0

## 2021-07-27 NOTE — Patient Instructions (Addendum)
[name], fue un placer verlo hoy.  Por favor haz lo siguiente:  1. Cambie su insulina actual a Tresiba 20 unidades una vez al da por la maana y [place] 15 unidades 1-2 veces al da con una comida, como se le indic hoy durante su cita. Si tiene alguna pregunta o si cree que ha ocurrido algo debido a este cambio, llmeme a m oa su mdico para informarnos. 2. Contine controlando los niveles de [place] en casa. Es muy importante que los registre y los lleve a su prxima cita con el mdico. 3. Contine haciendo los cambios de estilo de vida que hemos discutido juntos durante nuestra visita. La dieta y el ejercicio juegan un papel importante en la mejora de los niveles de azcar en la [place]. 4. Haga un seguimiento conmigo en dos o tres semanas.  Hipoglucemia o nivel bajo de azcar en la sangre:  El nivel bajo de azcar en la sangre puede ocurrir rpidamente y convertirse en una emergencia si no se trata de inmediato.  Si bien esto no debera suceder con frecuencia, puede ocurrir si se salta una comida o no come lo suficiente. Adems, si su insulina u otros medicamentos para la diabetes tienen una dosis demasiado alta, esto puede hacer que su nivel de azcar en la sangre baje.  Las seales de advertencia de niveles bajos de azcar en la sangre incluyen: 1. Sentirse tembloroso o mareado 2. Sentirse dbil o cansado 3. Hambre excesiva 4. Sentirse ansioso o molesto 5. Sudar incluso cuando no haces ejercicio  Qu hacer si tengo un nivel bajo de azcar en la sangre? Sigue la regla del 15 1. Controle su nivel de azcar en la sangre con su medidor. Si es inferior a 70, contine con el paso 2. 2. Trate con 15 gramos de carbohidratos de accin rpida que se encuentran en 3-4 tabletas de glucosa. Si no hay ninguno disponible, puede probar con caramelos duros, 1 cucharada de azcar o miel, 4 onzas de jugo de frutas o 6 onzas de refresco NORMAL. 3. Vuelva a controlar su nivel de azcar en  15 minutos. Si todava est por debajo de 70, vuelva a hacer lo que hizo en el paso 2. Si su nivel de azcar en la sangre ha vuelto a subir, contine y coma un refrigerio o una comida pequea compuesta de carbohidratos complejos (p. ej., granos integrales) y protenas en este momento para evitar la recurrencia del nivel bajo de azcar en la sangre.  Jason [name] it was a pleasure seeing you today.   Please do the following:  Switch your insulin from your current one to [place] 20 units once daily in the morning and [place] 15 units 1-2 times a day with a meal.as directed today during your appointment. If you have any questions or if you believe something has occurred because of this change, call me or your doctor to let one of [place] know.  Continue checking blood sugars at home. It's really important that you record these and bring these in to your next doctor's appointment.  Continue making the lifestyle changes we've discussed together during our visit. Diet and exercise play a significant role in improving your blood sugars.  Follow-up with me in two to three weeks.   Hypoglycemia or low blood sugar:   Low blood sugar can happen quickly and may become an emergency if not treated right away.   While this shouldn't happen often, it can be brought upon if you skip a  meal or do not eat enough. Also, if your insulin or other diabetes medications are dosed too high, this can cause your blood sugar to go to low.   Warning signs of low blood sugar include: Feeling shaky or dizzy Feeling weak or tired  Excessive hunger Feeling anxious or upset  Sweating even when you aren't exercising  What to do if I experience low blood sugar? Follow the Rule of 15 Check your blood sugar with your meter. If lower than 70, proceed to step 2.  Treat with 15 grams of fast acting carbs which is found in 3-4 glucose tablets. If none are available you can try hard candy, 1 tablespoon of sugar or honey,4 ounces of fruit  juice, or 6 ounces of REGULAR soda.  Re-check your sugar in 15 minutes. If it is still below 70, do what you did in step 2 again. If your blood sugar has come back up, go ahead and eat a snack or small meal made up of complex carbs (ex. Whole grains) and protein at this time to avoid recurrence of low blood sugar.

## 2021-07-27 NOTE — Progress Notes (Addendum)
Subjective:    Patient ID: Jason [name], male    DOB: December 16, 1975, 45 y.o.   MRN: [id number]  HPI Patient is a 45 y.o. male who presents for diabetes management. He is in good spirits and presents without assistance. Utilized [employer] [phone]. Patient was referred on 05/24/21 and last seen by provider, Dr. Montez Morita, on 06/27/21.  Patient reports that his blood glucose has been "sometimes been up and sometimes been low." He believes the low blood glucose has been due to being more adherent to medications and the higher blood glucose has been due to his dietary choices. Patient reports he has still had nausea and vomiting but this has improved some.   Patient reports diabetes was diagnosed around 15 years ago.   Insurance coverage/medication affordability: self-pay  Current diabetes medications include: insulin NPH 70/30 25 units in AM and 25 units in PM, [place] 5mg  Current hyperlipidemia medications include: atorvastatin 40mg  Patient states that He is taking his medications as prescribed. Patient denies adherence with medications. Patient states that He misses his medications 2 times per week, on average.  Do you feel that your medications are working for you?  no  Have you been experiencing any side effects to the medications prescribed? no  Do you have any problems obtaining medications due to transportation or finances?  Yes; self-pay    Patient reported dietary habits:  Eats 1-2 meals/day Patient reports eating vegetables and rice.  Drinks: water; sometimes juice   Patient-reported exercise habits: walking every day all day long for work  Patient denies hypoglycemic events. Patient denies polyuria (increased urination).  Patient denies polyphagia (increased appetite).  Patient reports polydipsia (increased thirst).  Patient reports neuropathy (nerve pain). Patient reports visual changes; denies seeing eye doctor recently Patient reports self foot exams.   Home  fasting blood sugars: 318, 228, 250, 176, 168, 219, 274, 77 446, 227, 325  Objective:   Labs:   Physical Exam Neurological:     Mental Status: He is alert and oriented to person, place, and time.    Review of Systems  Gastrointestinal:  Positive for diarrhea, nausea and vomiting.   Lab Results  Component Value Date   HGBA1C 11.0 (A) 05/24/2021   HGBA1C 11.3 (A) 04/26/2020   HGBA1C 11.9 (A) 01/12/2020   Lab Results  Component Value Date   MICRALBCREAT 278.6 (H) 06/07/2018    Lipid Panel     Component Value Date/Time   CHOL 206 (H) 12/24/2018 1117   TRIG 256 (H) 12/24/2018 1117   HDL 43 12/24/2018 1117   CHOLHDL 4.8 12/24/2018 1117   CHOLHDL 3.4 11/08/2013 0655   VLDL 17 11/08/2013 0655   LDLCALC 112 (H) 12/24/2018 1117    Assessment/Plan:   T2DM is not controlled likely due to sub-optimal dietary choices. Patient's adherence is improving. Will switch patient from 70/30 to basal bolus combination to improve glycemic control. Patient will take 20 units of Tresiba (40%) and Novolog 15 units BID (60%). In the future can consider resuming metformin as patient has had continued stomach upset with multiple trials off metformin therefore unlikely attributed to metformin usage. However, patient had elevated liver enzyme levels at last visit therefore will defer re-start for the time being until cause is identified. Following instruction patient verbalized understanding of treatment plan.  Switched insulin from 70/30 25 units BID to 11/10/2013 20 units once daily and Novolog 15 units 1-2 times a day with meals. Continued SGLT2-I dapagliflozin 02/22/2019) 5mg  once daily Extensively discussed pathophysiology of  diabetes, dietary effects on blood sugar control, and recommended lifestyle interventions. Counseled on s/sx of and management of hypoglycemia Next A1C anticipated October 2022.   Follow-up appointment 2-3 weeks to review sugar readings. Written patient instructions  provided.  This appointment required 35 minutes of direct patient care.  Thank you for involving pharmacy to assist in providing this patient's care.

## 2021-07-28 LAB — COMPREHENSIVE METABOLIC PANEL
ALT: 63 IU/L — ABNORMAL HIGH (ref 0–44)
AST: 28 IU/L (ref 0–40)
Albumin/Globulin Ratio: 1.2 (ref 1.2–2.2)
Albumin: 4.1 g/dL (ref 4.0–5.0)
Alkaline Phosphatase: 203 IU/L — ABNORMAL HIGH (ref 44–121)
BUN/Creatinine Ratio: 29 — ABNORMAL HIGH (ref 9–20)
BUN: 18 mg/dL (ref 6–24)
Bilirubin Total: 0.2 mg/dL (ref 0.0–1.2)
CO2: 21 mmol/L (ref 20–29)
Calcium: 9.5 mg/dL (ref 8.7–10.2)
Chloride: 99 mmol/L (ref 96–106)
Creatinine, Ser: 0.62 mg/dL — ABNORMAL LOW (ref 0.76–1.27)
Globulin, Total: 3.4 g/dL (ref 1.5–4.5)
Glucose: 150 mg/dL — ABNORMAL HIGH (ref 65–99)
Potassium: 4.1 mmol/L (ref 3.5–5.2)
Sodium: 138 mmol/L (ref 134–144)
Total Protein: 7.5 g/dL (ref 6.0–8.5)
eGFR: 122 mL/min/{1.73_m2} (ref >59)

## 2021-07-29 ENCOUNTER — Other Ambulatory Visit: Payer: Self-pay

## 2021-07-29 NOTE — Assessment & Plan Note (Signed)
T2DM is not controlled likely due to sub-optimal dietary choices. Patient's adherence is improving. Will switch patient from 70/30 to basal bolus combination to improve glycemic control. Patient will take 20 units of Tresiba (40%) and Novolog 15 units BID (60%). In the future can consider resuming metformin as patient has had continued stomach upset with multiple trials off metformin therefore unlikely attributed to metformin usage. However, patient had elevated liver enzyme levels at last visit therefore will defer re-start for the time being until cause is identified. Following instruction patient verbalized understanding of treatment plan.  1. Switched insulin from 70/30 25 units BID to [place] 20 units once daily and Novolog 15 units 1-2 times a day with meals. 2. Continued SGLT2-I dapagliflozin Marcelline Deist) 5mg  once daily 3. Extensively discussed pathophysiology of diabetes, dietary effects on blood sugar control, and recommended lifestyle interventions. 4. Counseled on s/sx of and management of hypoglycemia 5. Next A1C anticipated October 2022.   Follow-up appointment 2-3 weeks to review sugar readings. Written patient instructions provided.

## 2021-08-03 ENCOUNTER — Ambulatory Visit (INDEPENDENT_AMBULATORY_CARE_PROVIDER_SITE_OTHER): Payer: Self-pay | Admitting: Internal Medicine

## 2021-08-03 DIAGNOSIS — R748 Abnormal levels of other serum enzymes: Secondary | ICD-10-CM | POA: Insufficient documentation

## 2021-08-03 DIAGNOSIS — T7840XD Allergy, unspecified, subsequent encounter: Secondary | ICD-10-CM

## 2021-08-03 NOTE — Assessment & Plan Note (Signed)
Patient has a history of allergic rhinitis and is complaining of a chronic cough, sneezing, eye lacrimation, and rhinorrhea that has been going on for a few years. He was previously advised to take OTC Flonase and Zyrtec, although he has not done this. Advised the patient to pick up OTC Flonase and to use 1 spray in each nostril everyday for a month. Also advised the patient to take OTC Zyrtec everyday for 1 month. Will reevaluate symptoms at his next visit and see if they are any improved with this therapy.

## 2021-08-03 NOTE — Progress Notes (Signed)
  [facility] Internal Medicine Residency Telephone Encounter Continuity Care Appointment  HPI:  This telephone encounter was created for [name] on 08/03/2021 for the following purpose/cc of lab results review and to discuss chronic cough.   Past Medical History:  Past Medical History:  Diagnosis Date  . Diabetes mellitus    diagnosed in april when admitted to [facility] with blood glucose of 581. HbA1C was 9.9  . DKA (diabetic ketoacidoses)    hospitalized in 03/10/08  . Latent tuberculosis infection    PPD test measured 12 mm on 05/25/08. CXR on 06/03/08 showed no active disease or adenopathy. Started to take isoniazid 300 mg daily. on 06/11/08 and will  take medication  along with Vit  B6 for 9 months.  . Rheumatoid arthritis(714.0)    managed by Dr. Pollyann Savoy. sronegative. Dr. 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     ROS:  Review of Systems  Constitutional:  Negative for chills and fever.  HENT:  Positive for congestion. Negative for sinus pain and sore throat.   Eyes:  Negative for blurred vision and double vision.  Respiratory:  Positive for cough and sputum production. Negative for shortness of breath and wheezing.   Cardiovascular:  Negative for chest pain and palpitations.  Gastrointestinal:  Negative for diarrhea, nausea and vomiting.  Genitourinary: Negative.   Musculoskeletal: Negative.   Neurological:  Negative for dizziness and headaches.     Assessment / Plan / Recommendations:  Please see A&P under problem oriented charting for assessment of the patient's acute and chronic medical conditions.  As always, pt is advised that if symptoms worsen or new symptoms arise, they should go to an urgent care facility or to to ER for further evaluation.   Consent and Medical Decision Making:  Patient discussed with and called with Dr. Cleda Daub This is a telephone encounter between Jasper Riling and  Chauncey [name] on 08/03/2021 for lab results review. The visit was conducted with the patient located at home and Chauncey [name] at [facility]. The patient's identity was confirmed using their DOB and current address. The patient has consented to being evaluated through a telephone encounter and understands the associated risks (an examination cannot be done and the patient may need to come in for an appointment) / benefits (allows the patient to remain at home, decreasing exposure to coronavirus). I personally spent 30 minutes on medical discussion.

## 2021-08-03 NOTE — Assessment & Plan Note (Addendum)
Patient noted to have elevated alk phos at 201, and elevated AST/ALT of 58/95 approximately 1 month ago. Repeat labs one week ago show elevated alk phos at 203, and an improved AST/ALT of 28/63. It was initially thought that elevated liver enzymes could be secondary to statin use, although the patient reportedly has not taken his statin in >3 years. He also denies any alcohol use or tylenol use. Discussed this at length with the patient and we will recheck his labs at his next visit. Will continue to hold off on his statin at this time until his elevated liver enzyme workup is complete. Will check GGT to determine origin of elevated alk phos, ferritin to evaluate for possible hemochromatosis, hep B/C, and RUQ U/S to evaluate biliary system, as well as to rule to NAFLD given the patient's extensive history of poorly controlled diabetes.   Plan: - Recheck CMP at next visit - GGT, ferritin, and Hep B/C ordered - RUQ U/S ordered - Advised to hold off on statin therapy until workup complete

## 2021-08-03 NOTE — Progress Notes (Signed)
Internal Medicine Clinic Attending  I spoke with the patient.  I personally confirmed the key portions of the history and exam documented by Dr. Atway   and I reviewed pertinent patient test results.  The assessment, diagnosis, and plan were formulated together and I agree with the documentation in the resident's note.  

## 2021-08-05 NOTE — Progress Notes (Signed)
Received notification from [place] regarding patient assistance DENIAL for FARXIGA 5MG .   "pt's income reflects they may be eligible for an alternative funding source that covers product" (medicaid, etc.)  [phone]

## 2021-08-17 ENCOUNTER — Other Ambulatory Visit (HOSPITAL_COMMUNITY): Payer: Self-pay

## 2021-08-17 ENCOUNTER — Ambulatory Visit (INDEPENDENT_AMBULATORY_CARE_PROVIDER_SITE_OTHER): Payer: Self-pay | Admitting: Pharmacist

## 2021-08-17 ENCOUNTER — Other Ambulatory Visit: Payer: Self-pay

## 2021-08-17 DIAGNOSIS — Z23 Encounter for immunization: Secondary | ICD-10-CM

## 2021-08-17 DIAGNOSIS — E113399 Type 2 diabetes mellitus with moderate nonproliferative diabetic retinopathy without macular edema, unspecified eye: Secondary | ICD-10-CM

## 2021-08-17 DIAGNOSIS — E1165 Type 2 diabetes mellitus with hyperglycemia: Secondary | ICD-10-CM

## 2021-08-17 DIAGNOSIS — IMO0002 Reserved for concepts with insufficient information to code with codable children: Secondary | ICD-10-CM

## 2021-08-17 MED ORDER — TECHLITE PEN NEEDLES 32G X 4 MM MISC
2 refills | Status: DC
Start: 2021-08-17 — End: 2021-11-25
  Filled 2021-08-17: qty 100, 33d supply, fill #0

## 2021-08-17 NOTE — Patient Instructions (Signed)
[name], fue un placer verlo hoy.   Por favor haz lo siguiente:   1. Cambie su insulina actual a Tresiba 20 unidades una vez al da por la maana y Novolog 15 unidades 1-2 veces al da con una comida, como se le indic hoy durante su cita. Si tiene alguna pregunta o si cree que ha ocurrido algo debido a este cambio, llmeme a m oa su mdico para informarnos. 2. Contine controlando los niveles de [place] en casa. Es muy importante que los registre y los lleve a su prxima cita con el mdico. 3. Contine haciendo los cambios de estilo de vida que hemos discutido juntos durante nuestra visita. La dieta y el ejercicio juegan un papel importante en la mejora de los niveles de azcar en la Jason [name]. 4. Haga un seguimiento conmigo en dos o tres semanas.   Hipoglucemia o nivel bajo de azcar en la sangre:   El nivel bajo de azcar en la sangre puede ocurrir rpidamente y convertirse en una emergencia si no se trata de inmediato.   Si bien esto no debera suceder con frecuencia, puede ocurrir si se salta una comida o no come lo suficiente. Adems, si su insulina u otros medicamentos para la diabetes tienen una dosis demasiado alta, esto puede hacer que su nivel de azcar en la sangre baje.   Las seales de advertencia de niveles bajos de azcar en la sangre incluyen: 1. Sentirse tembloroso o mareado 2. Sentirse dbil o cansado 3. Hambre excesiva 4. Sentirse ansioso o molesto 5. Sudar incluso cuando no haces ejercicio   Qu hacer si tengo un nivel bajo de azcar en la sangre? Sigue la regla del 15 1. Controle su nivel de azcar en la sangre con su medidor. Si es inferior a 70, contine con el paso 2. 2. Trate con 15 gramos de carbohidratos de accin rpida que se encuentran en 3-4 tabletas de glucosa. Si no hay ninguno disponible, puede probar con caramelos duros, 1 cucharada de azcar o miel, 4 onzas de jugo de frutas o 6 onzas de refresco NORMAL. 3. Vuelva a controlar su nivel de  azcar en 15 minutos. Si todava est por debajo de 70, vuelva a hacer lo que hizo en el paso 2. Si su nivel de azcar en la sangre ha vuelto a subir, contine y coma un refrigerio o una comida pequea compuesta de carbohidratos complejos (p. ej., granos integrales) y protenas en este momento para evitar la recurrencia del nivel bajo de azcar en la sangre.   Jason [name] it was a pleasure seeing you today.    Please do the following:   Switch your insulin from your current one to [place] 20 units once daily in the morning and Novolog 15 units 1-2 times a day with a meal.as directed today during your appointment. If you have any questions or if you believe something has occurred because of this change, call me or your doctor to let one of [place] know.  Continue checking blood sugars at home. It's really important that you record these and bring these in to your next doctor's appointment.  Continue making the lifestyle changes we've discussed together during our visit. Diet and exercise play a significant role in improving your blood sugars.  Follow-up with me in two to three weeks.    Hypoglycemia or low blood sugar:    Low blood sugar can happen quickly and may become an emergency if not treated right away.    While  this shouldn't happen often, it can be brought upon if you skip a meal or do not eat enough. Also, if your insulin or other diabetes medications are dosed too high, this can cause your blood sugar to go to low.    Warning signs of low blood sugar include: Feeling shaky or dizzy Feeling weak or tired  Excessive hunger Feeling anxious or upset  Sweating even when you aren't exercising   What to do if I experience low blood sugar? Follow the Rule of 15 Check your blood sugar with your meter. If lower than 70, proceed to step 2.  Treat with 15 grams of fast acting carbs which is found in 3-4 glucose tablets. If none are available you can try hard candy, 1 tablespoon of sugar or  honey,4 ounces of fruit juice, or 6 ounces of REGULAR soda.  Re-check your sugar in 15 minutes. If it is still below 70, do what you did in step 2 again. If your blood sugar has come back up, go ahead and eat a snack or small meal made up of complex carbs (ex. Whole grains) and protein at this time to avoid recurrence of low blood sugar.

## 2021-08-17 NOTE — Progress Notes (Signed)
Subjective:    Patient ID: Jason [name], male    DOB: 10-28-1976, 45 y.o.   MRN: [id number]  HPI Patient is a 45 y.o. male who presents for diabetes management. He is in good spirits and presents without assistance. Utilized [employer] [phone]. Patient was referred on 05/24/21 and last seen by provider, Dr. Ned Card on 08/03/21. Last seen in pharmacy clinic on 07/27/21.  Patient reports that his blood glucose has been "the same, some high and some low." Patient reports picking up the new insulin but it was "hard to inject" so he switched back to insulin NPH 70/30.   Patient reports diabetes was diagnosed around 15 years ago.   Insurance coverage/medication affordability: self-pay  Current diabetes medications include: insulin NPH 70/30 25 units in AM and 20 units in PM, [place] 5mg  Current hyperlipidemia medications include: atorvastatin 40mg  Patient states that He is taking his medications as prescribed. Patient denies adherence with medications. Patient states that He misses his medications 2-3 times per week, on average.  Do you feel that your medications are working for you?  no  Have you been experiencing any side effects to the medications prescribed? no  Do you have any problems obtaining medications due to transportation or finances?  Yes; self-pay    Patient reported dietary habits:  Eats 1-2 meals/day Patient reports eating vegetables and rice.  Drinks: water; sometimes juice   Patient-reported exercise habits: walking every day all day long for work  Patient reports hypoglycemic event with blood glucose of 70 in which he was sweating. Patient states this is due to taking insulin and not eating. He reports drinking juice.  Patient denies polyuria (increased urination).  Patient denies polyphagia (increased appetite).  Patient reports polydipsia (increased thirst).  Patient reports neuropathy (nerve pain). Patient reports visual changes; denies seeing eye doctor  recently Patient reports self foot exams.   Reports blood glucose mostly in 200's but recalls readings of 316 (this morning), 70, 100, and 150  Objective:   Labs:   Physical Exam Neurological:     Mental Status: He is alert and oriented to person, place, and time.    Review of Systems  Gastrointestinal:  Positive for diarrhea, nausea and vomiting.   Lab Results  Component Value Date   HGBA1C 11.0 (A) 05/24/2021   HGBA1C 11.3 (A) 04/26/2020   HGBA1C 11.9 (A) 01/12/2020   Lab Results  Component Value Date   MICRALBCREAT 278.6 (H) 06/07/2018    Lipid Panel     Component Value Date/Time   CHOL 206 (H) 12/24/2018 1117   TRIG 256 (H) 12/24/2018 1117   HDL 43 12/24/2018 1117   CHOLHDL 4.8 12/24/2018 1117   CHOLHDL 3.4 11/08/2013 0655   VLDL 17 11/08/2013 0655   LDLCALC 112 (H) 12/24/2018 1117    Assessment/Plan:   T2DM is not controlled likely due to sub-optimal dietary choices. Patient's adherence continues to be sub-optimal. Utilized demo pen to show patient how to properly administer 11/10/2013 and 02/22/2019. Patient amendable to try to take 20 units of Tresiba (40%) and Novolog 15 units BID (60%) instead of 70/30. Patient unable to get labs ordered from visit  on 08/03/21 therefore will return to obtain. Following instruction patient verbalized understanding of treatment plan.  Switched insulin from 70/30 25 units BID to [employer] 20 units once daily and Novolog 15 units 1-2 times a day with meals. Continued SGLT2-I dapagliflozin 08/05/21) 5mg  once daily Extensively discussed pathophysiology of diabetes, dietary effects on blood sugar control,  and recommended lifestyle interventions. Counseled on s/sx of and management of hypoglycemia Next A1C anticipated October 2022.   Follow-up appointment 2 weeks to review sugar readings. Written patient instructions provided.  This appointment required 35 minutes of direct patient care.  Thank you for involving pharmacy to assist  in providing this patient's care.

## 2021-08-18 NOTE — Assessment & Plan Note (Signed)
T2DM is not controlled likely due to sub-optimal dietary choices. Patient's adherence continues to be sub-optimal. Utilized demo pen to show patient how to properly administer [place] and [employer]. Patient amendable to try to take 20 units of Tresiba (40%) and Novolog 15 units BID (60%) instead of 70/30. Patient unable to get labs ordered from visit  on 08/03/21 therefore will return to obtain. Following instruction patient verbalized understanding of treatment plan.  1. Switched insulin from 70/30 25 units BID to [place] 20 units once daily and Novolog 15 units 1-2 times a day with meals. 2. Continued SGLT2-I dapagliflozin Marcelline Deist) 5mg  once daily 3. Extensively discussed pathophysiology of diabetes, dietary effects on blood sugar control, and recommended lifestyle interventions. 4. Counseled on s/sx of and management of hypoglycemia 5. Next A1C anticipated October 2022.

## 2021-08-23 ENCOUNTER — Ambulatory Visit (HOSPITAL_COMMUNITY): Payer: Self-pay | Attending: Internal Medicine

## 2021-09-05 ENCOUNTER — Encounter: Payer: Self-pay | Admitting: Pharmacist

## 2021-09-05 ENCOUNTER — Other Ambulatory Visit: Payer: Self-pay

## 2021-11-23 ENCOUNTER — Other Ambulatory Visit: Payer: Self-pay | Admitting: Internal Medicine

## 2021-11-23 ENCOUNTER — Other Ambulatory Visit (HOSPITAL_COMMUNITY): Payer: Self-pay

## 2021-11-23 NOTE — Telephone Encounter (Signed)
Attempted to contact patient utilizing Spanish interpreter to discuss current regimen. Voicemail box not set up so unable to leave voicemail.

## 2021-11-23 NOTE — Telephone Encounter (Signed)
insulin aspart (NOVOLOG FLEXPEN) 100 UNIT/ML FlexPen, refill request @ [facility].

## 2021-11-23 NOTE — Telephone Encounter (Signed)
Received refill request from [place] at [facility]. Per Dr. Lucy Chris note on 08/17/21:  Switched insulin from 70/30 25 units BID to [place] 20 units once daily and Novolog 15 units 1-2 times a day with meals. Continued SGLT2-I dapagliflozin Wilder Glade) 5mg  once daily  Spoke with Mardene Celeste who states patient specifically asked for this Rx and states he's been out x 2 weeks. Also, states Tyler Aas is not on IM Program. Will forward to Dr. Georgina Peer to advise.

## 2021-11-25 ENCOUNTER — Other Ambulatory Visit (HOSPITAL_COMMUNITY): Payer: Self-pay

## 2021-11-25 ENCOUNTER — Other Ambulatory Visit: Payer: Self-pay

## 2021-11-25 ENCOUNTER — Ambulatory Visit: Payer: Self-pay | Admitting: Student

## 2021-11-25 ENCOUNTER — Encounter: Payer: Self-pay | Admitting: Student

## 2021-11-25 VITALS — BP 142/92 | HR 101 | Temp 98.4°F | Ht 62.5 in | Wt 133.4 lb

## 2021-11-25 DIAGNOSIS — R030 Elevated blood-pressure reading, without diagnosis of hypertension: Secondary | ICD-10-CM

## 2021-11-25 DIAGNOSIS — Z794 Long term (current) use of insulin: Secondary | ICD-10-CM

## 2021-11-25 DIAGNOSIS — Z5989 Other problems related to housing and economic circumstances: Secondary | ICD-10-CM

## 2021-11-25 DIAGNOSIS — Z0000 Encounter for general adult medical examination without abnormal findings: Secondary | ICD-10-CM

## 2021-11-25 DIAGNOSIS — E1140 Type 2 diabetes mellitus with diabetic neuropathy, unspecified: Secondary | ICD-10-CM

## 2021-11-25 LAB — GLUCOSE, CAPILLARY: Glucose-Capillary: 397 mg/dL — ABNORMAL HIGH (ref 70–99)

## 2021-11-25 LAB — POCT GLYCOSYLATED HEMOGLOBIN (HGB A1C): Hemoglobin A1C: 13.7 % — AB (ref 4.0–5.6)

## 2021-11-25 MED ORDER — NOVOLOG FLEXPEN 100 UNIT/ML ~~LOC~~ SOPN
15.0000 [IU] | PEN_INJECTOR | Freq: Two times a day (BID) | SUBCUTANEOUS | 0 refills | Status: DC
  Filled 2021-11-25: qty 9, 30d supply, fill #0

## 2021-11-25 MED ORDER — METFORMIN HCL 1000 MG PO TABS
1000.0000 mg | ORAL_TABLET | Freq: Two times a day (BID) | ORAL | 5 refills | Status: DC
  Filled 2021-11-25 (×2): qty 60, 30d supply, fill #0

## 2021-11-25 MED ORDER — INSULIN DEGLUDEC 100 UNIT/ML ~~LOC~~ SOPN
10.0000 [IU] | PEN_INJECTOR | Freq: Every day | SUBCUTANEOUS | 0 refills | Status: DC
  Filled 2021-11-25: qty 3, 30d supply, fill #0

## 2021-11-25 MED ORDER — MICROLET LANCETS MISC
6 refills | Status: DC
  Filled 2021-11-25: qty 100, 30d supply, fill #0

## 2021-11-25 MED ORDER — UNIFINE PENTIPS 32G X 4 MM MISC
2 refills | Status: DC
  Filled 2021-11-25: qty 100, 30d supply, fill #0

## 2021-11-25 MED ORDER — GLUCOSE BLOOD VI STRP
ORAL_STRIP | 6 refills | Status: DC
  Filled 2021-11-25: qty 100, 30d supply, fill #0

## 2021-11-25 NOTE — Telephone Encounter (Signed)
Patient's family member called in stating [facility] does not have any Rxs. Spoke with Margreta Journey at [facility] who states they received Rxs for metformin and Tresiba but Tyler Aas will not be in till tomorrow. Pharmacy is waiting on Rx for Novolog flex pen. Please sign Rx if patient is to continue this.

## 2021-11-25 NOTE — Patient Instructions (Addendum)
Por favor, tome sus niveles de azcar en la sangre antes de las comidas.  Comenzaremos con Evaristo Bury 10u por la maana y continuaremos con la pldora de metformina.   Programaremos un seguimiento con nuestro farmacutico a principios de Sales executive. En ese momento por favor traiga su glucmetro.    Please take your blood sugars before meals.   We will start you back on Tresiba 10u in the morning and continue metformin pill.    We will schedule follow up with our pharmacist early next week. At that time please bring your glucometer.

## 2021-11-27 DIAGNOSIS — R030 Elevated blood-pressure reading, without diagnosis of hypertension: Secondary | ICD-10-CM | POA: Insufficient documentation

## 2021-11-27 NOTE — Assessment & Plan Note (Signed)
Uninsured, in the process of obtaining orange card.

## 2021-11-27 NOTE — Assessment & Plan Note (Signed)
Patient with elevated blood pressure this visit 142/92. During previous visits his blood pressure has been at goal.   Will continue to monitor

## 2021-11-27 NOTE — Assessment & Plan Note (Addendum)
Patient prescribed antidiabetic regimen includes: Tresiba 10u daily and aspart 15u BID with meals, farxiga 5mg  qd, and metformin 1000mg  bid.   Patient states that he has been out of his medications for 1 month, insulin, metformin, and farxiga. He states that he attempted to call his pharmacy but unfortunately he was still unable to get them filled. In addition, he has not been checking his blood sugar for the past three weeks because he ran out of supplies. Prior to running out of his insulin, patient was taking tresiba 20u and novolog 15u with dinner. He has had polyuria, polydipsia, and dizziness with standing (likely related to being volume down with frequent micturition). He denies abdominal pain, fevers, chills, sob, or cp. Of note, patient's a1c 13.7 this clinic visit.  Plan: We resumed his insulin at tresiba 10u daily as he has not been taking this regularly. Also resumed metformin. Held patients farxiga. Sent prescription for his glucometer supplies. Discussed with patient that he can always call into our office if he has any questions or needs help figuring out his medications. We also discussed helping patient get the orange card and made sure he had the proper documents and knew who to send this information to. Also sent referral for diabetes educator and sent message to Dr. Nicholaus Bloom to see if patient can get scheduled for an appointment next week for assistance ensuring he is taking his medications properly and uptitrating his insulin.

## 2021-11-27 NOTE — Progress Notes (Signed)
° °  CC: follow up of diabetes  HPI:  Jason [name] is a 46 y.o. M with pmh per below. Who presents for follow up regarding his T2DM. Please see encounters tab and problem based charting for further details.   Past Medical History:  Diagnosis Date   Diabetes mellitus    diagnosed in april when admitted to [facility] with blood glucose of 581. HbA1C was 9.9   DKA (diabetic ketoacidoses)    hospitalized in 03/10/08   Latent tuberculosis infection    PPD test measured 12 mm on 05/25/08. CXR on 06/03/08 showed no active disease or adenopathy. Started to take isoniazid 300 mg daily. on 06/11/08 and will  take medication  along with Vit  B6 for 9 months.   Rheumatoid arthritis(714.0)    managed by Dr. Pollyann Savoy. sronegative. Dr. 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   Review of Systems:  Please see encounters tab and problem based charting for further details.   Physical Exam:  Vitals:   11/25/21 0943 11/25/21 1009  BP: (!) 153/95 (!) 142/92  Pulse: (!) 101 (!) 101  Temp: 98.4 F (36.9 C)   TempSrc: Oral   SpO2: 100%   Weight: 133 lb 6.4 oz (60.5 kg)   Height: 5' 2.5" (1.588 m)    Physical Exam  Constitutional: well-developed, well-nourished, and in no distress.  HENT:  Head: Normocephalic and atraumatic.  Eyes: EOM are normal.  Neck: Normal range of motion.  Cardiovascular: Normal rate, regular rhythm, normal heart sounds and intact distal pulses. Exam reveals no gallop and no friction rub.  No murmur heard. Pulmonary/Chest: Non labored breathing on RA, no wheezing or rales Abdominal: Soft. Non tender, non distended Musculoskeletal: Normal range of motion.        General: No tenderness or edema.  Neurological: Non focal, A&O x3 Skin: Skin is warm and dry.    Assessment & Plan:   See Encounters Tab for problem based charting.  Patient discussed with Dr.  Sol Blazing

## 2021-11-28 ENCOUNTER — Other Ambulatory Visit (HOSPITAL_COMMUNITY): Payer: Self-pay

## 2021-11-28 NOTE — Progress Notes (Signed)
Internal Medicine Clinic Attending  Case discussed with Dr. Carter  At the time of the visit.  We reviewed the resident's history and exam and pertinent patient test results.  I agree with the assessment, diagnosis, and plan of care documented in the resident's note.  

## 2021-12-01 ENCOUNTER — Ambulatory Visit (INDEPENDENT_AMBULATORY_CARE_PROVIDER_SITE_OTHER): Payer: Self-pay | Admitting: Pharmacist

## 2021-12-01 ENCOUNTER — Telehealth: Payer: Self-pay | Admitting: *Deleted

## 2021-12-01 ENCOUNTER — Other Ambulatory Visit: Payer: Self-pay | Admitting: Student

## 2021-12-01 DIAGNOSIS — E1140 Type 2 diabetes mellitus with diabetic neuropathy, unspecified: Secondary | ICD-10-CM

## 2021-12-01 DIAGNOSIS — Z5989 Other problems related to housing and economic circumstances: Secondary | ICD-10-CM

## 2021-12-01 DIAGNOSIS — Z794 Long term (current) use of insulin: Secondary | ICD-10-CM

## 2021-12-01 NOTE — Chronic Care Management (AMB) (Signed)
°  Care Management   Note  12/01/2021 Name: Jason [name] MRN: [id number] DOB: 05/31/1976  Dedrick Avera is a 46 y.o. year old male who is a primary care patient of Belva Agee, MD. I reached out to Jasper Riling by phone today using [employer] interpreter services ID# [id number] named Merlyn Albert response to a referral sent by Mr. Kaiyden Aftab primary care provider.   Mr. Lino was given information about care management services today including:  Care management services include personalized support from designated clinical staff supervised by his physician, including individualized plan of care and coordination with other care providers 24/7 contact phone numbers for assistance for urgent and routine care needs. The patient may stop care management services at any time by phone call to the office staff.  Patient agreed to services and verbal consent obtained.   Follow up plan: Telephone appointment with care management team member scheduled for:12/05/21  [facility] Guide, Embedded Care Coordination [facility]   Care Management  Direct Dial: [phone]

## 2021-12-01 NOTE — Patient Instructions (Addendum)
[name], fue un placer verlo hoy.  Por favor haz lo siguiente:  1. Recoja sus medicamentos de la farmacia hoy y, si no puede obtenerlos, vuelva a llamar a la clnica al [phone] y pida hablar con el farmacutico. 2. Contine controlando los niveles de azcar en la sangre en casa. Es muy importante que los registre y los lleve a su prxima cita con el mdico. 3. Contine haciendo los cambios de estilo de vida que hemos discutido juntos durante nuestra visita. La dieta y el ejercicio juegan un papel importante en la mejora de los niveles de azcar en la [place]. 4. Sgueme el 23/1 a las 10:00 a. m.   Hipoglucemia o nivel bajo de azcar en la sangre:  El nivel bajo de azcar en la sangre puede ocurrir rpidamente y convertirse en una emergencia si no se trata de inmediato.  Si bien esto no debera suceder con frecuencia, puede ocurrir si se salta una comida o no come lo suficiente. Adems, si su insulina u otros medicamentos para la diabetes tienen una dosis demasiado alta, esto puede hacer que su nivel de azcar en la sangre baje.  Las seales de advertencia de niveles bajos de azcar en la sangre incluyen: 1. Sentirse tembloroso o mareado 2. Sentirse dbil o cansado 3. Hambre excesiva 4. Sentirse ansioso o molesto 5. Sudar incluso cuando no haces ejercicio  Qu hacer si tengo un nivel bajo de azcar en la sangre? Sigue la regla del 15 1. Controle su nivel de azcar en la sangre con su medidor. Si es inferior a 70, contine con el paso 2. 2. Trate con 15 gramos de carbohidratos de accin rpida que se encuentran en 3-4 tabletas de glucosa. Si no hay ninguno disponible, puede probar con caramelos duros, 1 cucharada de azcar o miel, 4 onzas de jugo de frutas o 6 onzas de refresco NORMAL. 3. Vuelva a controlar su nivel de azcar en 15 minutos. Si todava est por debajo de 70, vuelva a hacer lo que hizo en el paso 2. Si su nivel de azcar en la sangre ha vuelto a subir, contine y  coma un refrigerio o una comida pequea compuesta de carbohidratos complejos (p. ej., granos integrales) y protenas en este momento para evitar la recurrencia del nivel bajo de azcar en la sangre.   [name] it was a pleasure seeing you today.   Please do the following:  Please pick up your medications from the pharmacy today and if you are unable to get them please call the clinic back at [phone] and ask to [name] with the pharmacist Continue checking blood sugars at home. It's really important that you record these and bring these in to your next doctor's appointment.  Continue making the lifestyle changes we've discussed together during our visit. Diet and exercise play a significant role in improving your blood sugars.  Follow-up with me on 1/23 at 10:00 AM.   Hypoglycemia or low blood sugar:   Low blood sugar can happen quickly and may become an emergency if not treated right away.   While this shouldn't happen often, it can be brought upon if you skip a meal or do not eat enough. Also, if your insulin or other diabetes medications are dosed too high, this can cause your blood sugar to go to low.   Warning signs of low blood sugar include: Feeling shaky or dizzy Feeling weak or tired  Excessive hunger Feeling anxious or upset  Sweating even when you aren't exercising  What to do if I experience low blood sugar? Follow the Rule of 15 Check your blood sugar with your meter. If lower than 70, proceed to step 2.  Treat with 15 grams of fast acting carbs which is found in 3-4 glucose tablets. If none are available you can try hard candy, 1 tablespoon of sugar or honey,4 ounces of fruit juice, or 6 ounces of REGULAR soda.  Re-check your sugar in 15 minutes. If it is still below 70, do what you did in step 2 again. If your blood sugar has come back up, go ahead and eat a snack or small meal made up of complex carbs (ex. Whole grains) and protein at this time to avoid recurrence  of low blood sugar.

## 2021-12-01 NOTE — Progress Notes (Signed)
° °  Subjective:    Patient ID: Jason [name], male    DOB: 03-28-1976, 46 y.o.   MRN: [id number]  HPI Patient is a 46 y.o. male who presents for diabetes management. He is in good spirits and presents without assistance. Utilized [employer] [phone]. Patient was referred on 05/24/21 and last seen by provider, Dr Montez Morita on 11/25/20. Last seen in pharmacy clinic on 08/17/21.  Prior to appointment with Dr. Montez Morita patient had called clinic and reported being out of his insulin for a couple months. He states Dr. Montez Morita sent in new prescription but the pharmacy did not have the medication when he went after his appointment so he has yet to pick up the medication, including insulin.   Patient reports diabetes was diagnosed around 15 years ago.   Insurance coverage/medication affordability: self-pay  Current diabetes medications include: N/a Current hyperlipidemia medications include: atorvastatin 40mg  Patient states that He is not taking his medications as prescribed. Patient denies adherence with medications.  Do you feel that your medications are working for you?  no  Have you been experiencing any side effects to the medications prescribed? no  Do you have any problems obtaining medications due to transportation or finances?  Yes; self-pay    Patient reported dietary habits:  Eats 1-2 meals/day Patient reports eating vegetables and rice.  Drinks: water; sometimes juice   Patient-reported exercise habits: walking every day all day long for work  Patient denies hypoglycemic event. Patient denies polyuria (increased urination).  Patient denies polyphagia (increased appetite).  Patient reports polydipsia (increased thirst).  Patient reports neuropathy (nerve pain). Patient reports visual changes; denies seeing eye doctor recently Patient reports self foot exams.   Patient states he has not been checking his blood glucose.  Objective:   Labs:   Physical Exam Neurological:      Mental Status: He is alert and oriented to person, place, and time.    Review of Systems  Gastrointestinal:  Positive for diarrhea, nausea and vomiting.   Lab Results  Component Value Date   HGBA1C 11.0 (A) 05/24/2021   HGBA1C 11.3 (A) 04/26/2020   HGBA1C 11.9 (A) 01/12/2020   Lab Results  Component Value Date   MICRALBCREAT 278.6 (H) 06/07/2018    Lipid Panel     Component Value Date/Time   CHOL 206 (H) 12/24/2018 1117   TRIG 256 (H) 12/24/2018 1117   HDL 43 12/24/2018 1117   CHOLHDL 4.8 12/24/2018 1117   CHOLHDL 3.4 11/08/2013 0655   VLDL 17 11/08/2013 0655   LDLCALC 112 (H) 12/24/2018 1117    Assessment/Plan:   T2DM is not controlled likely due to sub-optimal dietary choices. Patient's adherence continues to be sub-optimal. Patient will present to pharmacy to pick-up medications. Following instruction patient verbalized understanding of treatment plan.  Please pick up Tresiba 10 units once daily in the morning, Novolog 15 units twice daily, and metformin 1000mg  twice daily Extensively discussed pathophysiology of diabetes, dietary effects on blood sugar control, and recommended lifestyle interventions. Counseled on s/sx of and management of hypoglycemia Next A1C anticipated April 2023  Follow-up appointment 2 weeks to review sugar readings. Written patient instructions provided.  This appointment required 35 minutes of direct patient care.  Thank you for involving pharmacy to assist in providing this patient's care.

## 2021-12-05 ENCOUNTER — Telehealth: Payer: Self-pay | Admitting: Licensed Clinical Social Worker

## 2021-12-05 NOTE — Telephone Encounter (Signed)
°  Care Management   Follow Up Note   12/05/2021 Name: Jason [name] MRN: [id number] DOB: 12/12/1975   Referred by: Belva Agee, MD Reason for referral : No chief complaint on file.   An unsuccessful telephone outreach was attempted today. The patient was referred to the case management team for assistance with care management and care coordination.   Follow Up Plan: The care management team will reach out to the patient again over the next 7 days.  Christen Butter, BSW  Social Worker [facility]  [phone]

## 2021-12-09 ENCOUNTER — Telehealth: Payer: Self-pay

## 2021-12-12 ENCOUNTER — Other Ambulatory Visit (HOSPITAL_COMMUNITY): Payer: Self-pay

## 2021-12-12 ENCOUNTER — Ambulatory Visit (INDEPENDENT_AMBULATORY_CARE_PROVIDER_SITE_OTHER): Payer: Self-pay | Admitting: Student

## 2021-12-12 ENCOUNTER — Ambulatory Visit (INDEPENDENT_AMBULATORY_CARE_PROVIDER_SITE_OTHER): Payer: Self-pay | Admitting: Pharmacist

## 2021-12-12 ENCOUNTER — Other Ambulatory Visit: Payer: Self-pay

## 2021-12-12 DIAGNOSIS — Z794 Long term (current) use of insulin: Secondary | ICD-10-CM

## 2021-12-12 DIAGNOSIS — E1140 Type 2 diabetes mellitus with diabetic neuropathy, unspecified: Secondary | ICD-10-CM

## 2021-12-12 DIAGNOSIS — E113393 Type 2 diabetes mellitus with moderate nonproliferative diabetic retinopathy without macular edema, bilateral: Secondary | ICD-10-CM

## 2021-12-12 DIAGNOSIS — R748 Abnormal levels of other serum enzymes: Secondary | ICD-10-CM

## 2021-12-12 MED ORDER — NOVOLOG FLEXPEN 100 UNIT/ML ~~LOC~~ SOPN
20.0000 [IU] | PEN_INJECTOR | Freq: Two times a day (BID) | SUBCUTANEOUS | 0 refills | Status: DC
  Filled 2021-12-12: qty 12, 30d supply, fill #0

## 2021-12-12 MED ORDER — INSULIN DEGLUDEC 100 UNIT/ML ~~LOC~~ SOPN
30.0000 [IU] | PEN_INJECTOR | Freq: Every day | SUBCUTANEOUS | 0 refills | Status: DC
Start: 2021-12-12 — End: 2021-12-26
  Filled 2021-12-12: qty 15, 50d supply, fill #0

## 2021-12-12 NOTE — Progress Notes (Signed)
Subjective:    Patient ID: Jason [name], Jason [name]    DOB: 1976/03/31, 46 y.o.   MRN: [id number]  HPI Patient is a 46 y.o. Jason [name] who presents for diabetes management. He is in good spirits and presents without assistance. Utilized [employer] [phone]. Patient was referred on 05/24/21 and last seen by provider, Dr. Ned CardAtway on 08/03/21. Last seen in pharmacy clinic on 12/01/21.  Patient reports he was able to pick up all his medication and has been taking the metformin and insulin. 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 It isn't doing me any good because it isn't bringing down my blood sugars"  Patient reports diabetes was diagnosed around 15 years ago.   Insurance coverage/medication affordability: self-pay  Current diabetes medications include: insulin degludec [name] 20 units twice daily (different than prescribed amount), Novolog 20 units twice daily (different than prescribed amount), metformin 1000mg  twice daily Current hyperlipidemia medications include: atorvastatin 40mg  Patient states that He is taking his medications as prescribed. Patient denies adherence with medications. Patient states that He misses his medications 2-3 times per week, on average.  Do you feel that your medications are working for you?  no  Have you been experiencing any side effects to the medications prescribed? no  Do you have any problems obtaining medications due to transportation or finances?  Yes; self-pay    Patient reported dietary habits:  Eats 1-2 meals/day Patient reports eating vegetables and rice.  Drinks: water; sometimes juice   Patient-reported exercise habits: walking every day all day long for work  Patient denies hypoglycemic event.  Patient reports polyuria (increased urination).  Patient reports polyphagia (increased appetite).  Patient reports polydipsia (increased thirst).  Patient reports  neuropathy (nerve pain). Patient reports visual changes; denies seeing eye doctor recently due to lack of insurance coverage Patient reports self foot exams.   Patient reports his blood glucose is 300-400's  Objective:   Labs:   Physical Exam Neurological:     Mental Status: He is alert and oriented to person, place, and time.   Review of Systems  Gastrointestinal:  Positive for nausea and vomiting. Negative for diarrhea.    Lab Results  Component Value Date   HGBA1C 11.0 (A) 05/24/2021   HGBA1C 11.3 (A) 04/26/2020   HGBA1C 11.9 (A) 01/12/2020   Lab Results  Component Value Date   MICRALBCREAT 278.6 (H) 06/07/2018   Lipid Panel     Component Value Date/Time   CHOL 206 (H) 12/24/2018 1117   TRIG 256 (H) 12/24/2018 1117   HDL 43 12/24/2018 1117   CHOLHDL 4.8 12/24/2018 1117   CHOLHDL 3.4 11/08/2013 0655   VLDL 17 11/08/2013 0655   LDLCALC 112 (H) 12/24/2018 1117    Assessment/Plan:   T2DM is not controlled likely due to sub-optimal dietary choices and medication adherence. Will have patient return in one week with medication in hand as concerns patient is not taking insulin properly. Following instruction patient verbalized understanding of treatment plan.  Will defer medication changes to provider visit today and then discuss at next follow-up visit. Extensively discussed pathophysiology of diabetes, dietary effects on blood sugar control, and recommended lifestyle interventions. Counseled on s/sx of and management of hypoglycemia Next A1C anticipated April 2023  Follow-up appointment 1 week to review sugar readings. Written patient instructions provided.  This appointment required 35 minutes of direct patient care.  Thank you for involving pharmacy  to assist in providing this patient's care.

## 2021-12-12 NOTE — Patient Instructions (Signed)
Tome Tresiba 30 unidades por la [place].  Novolog 20 unidades con desayuno y 20 unidades con cena  controle su nivel de azcar en la sangre por la maana y antes de comer  [place] seguimiento con el Dr. Nicholaus BloomKelley la prxima semana. Traiga todas sus mediaciones y su medidor  La comprobaremos a los ojos en su visita la prxima semana.  Seguimiento en 2 semanas en clnica

## 2021-12-13 LAB — CMP14 + ANION GAP
ALT: 35 IU/L (ref 0–44)
AST: 29 IU/L (ref 0–40)
Albumin/Globulin Ratio: 1.8 (ref 1.2–2.2)
Albumin: 4.6 g/dL (ref 4.0–5.0)
Alkaline Phosphatase: 121 IU/L (ref 44–121)
Anion Gap: 15 mmol/L (ref 10.0–18.0)
BUN/Creatinine Ratio: 29 — ABNORMAL HIGH (ref 9–20)
BUN: 16 mg/dL (ref 6–24)
Bilirubin Total: 0.2 mg/dL (ref 0.0–1.2)
CO2: 20 mmol/L (ref 20–29)
Calcium: 9.6 mg/dL (ref 8.7–10.2)
Chloride: 102 mmol/L (ref 96–106)
Creatinine, Ser: 0.55 mg/dL — ABNORMAL LOW (ref 0.76–1.27)
Globulin, Total: 2.6 g/dL (ref 1.5–4.5)
Glucose: 187 mg/dL — ABNORMAL HIGH (ref 70–99)
Potassium: 4.3 mmol/L (ref 3.5–5.2)
Sodium: 137 mmol/L (ref 134–144)
Total Protein: 7.2 g/dL (ref 6.0–8.5)
eGFR: 126 mL/min/{1.73_m2} (ref >59)

## 2021-12-13 LAB — FERRITIN: Ferritin: 104 ng/mL (ref 30–400)

## 2021-12-13 LAB — HEPATITIS B SURFACE ANTIGEN: Hepatitis B Surface Ag: NEGATIVE

## 2021-12-13 LAB — HEPATITIS B SURFACE ANTIBODY,QUALITATIVE: Hep B Surface Ab, Qual: NONREACTIVE

## 2021-12-13 LAB — HEPATITIS B CORE ANTIBODY, IGM: Hep B C IgM: NEGATIVE

## 2021-12-13 LAB — GAMMA GT: GGT: 165 IU/L — ABNORMAL HIGH (ref 0–65)

## 2021-12-13 LAB — HEPATITIS C ANTIBODY: Hep C Virus Ab: <0.1 s/co ratio (ref 0.0–0.9)

## 2021-12-13 NOTE — Progress Notes (Signed)
° °  CC: diabetes follow up, blurred vision  HPI:  Mr.Gared Golliher is a 46 y.o. male with a history of uncontrolled DM presents for blurred vision and diabetes follow up. Patient interviewed with video interpretor present. Please refer to problem based charting for further details and assessment and plan of current problem and chronic medical conditions.   Past Medical History:  Diagnosis Date   Diabetes mellitus    diagnosed in april when admitted to [facility] with blood glucose of 581. HbA1C was 9.9   DKA (diabetic ketoacidoses)    hospitalized in 03/10/08   Latent tuberculosis infection    PPD test measured 12 mm on 05/25/08. CXR on 06/03/08 showed no active disease or adenopathy. Started to take isoniazid 300 mg daily. on 06/11/08 and will  take medication  along with Vit  B6 for 9 months.   Rheumatoid arthritis(714.0)    managed by Dr. Pollyann Savoy. sronegative. Dr. 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   Review of Systems:  Negative as per HPI  Physical Exam:  Vitals:   12/12/21 1051  BP: 117/72  Pulse: 92  Temp: 98.2 F (36.8 C)  TempSrc: Oral  SpO2: 97%  Height: 5\' 4"  (1.626 m)   Physical Exam Constitutional:      Appearance: Normal appearance. He is normal weight.  HENT:     Head: Normocephalic and atraumatic.     Nose: Nose normal. No congestion.     Mouth/Throat:     Mouth: Mucous membranes are dry.     Pharynx: Oropharynx is clear.  Eyes:     General: No scleral icterus.    Extraocular Movements: Extraocular movements intact.     Conjunctiva/sclera: Conjunctivae normal.     Pupils: Pupils are equal, round, and reactive to light.     Comments: Unable to appreciate largest letters on snellen chart, able to identify number of fingers  Cardiovascular:     Rate and Rhythm: Normal rate and regular rhythm.     Pulses: Normal pulses.  Pulmonary:     Effort: Pulmonary effort is normal.     Breath sounds:  Normal breath sounds.  Abdominal:     General: Abdomen is flat. Bowel sounds are normal. There is no distension.     Palpations: Abdomen is soft.     Tenderness: There is abdominal tenderness (bilaterall lower quadrants). There is no guarding.  Musculoskeletal:     Right lower leg: No edema.     Left lower leg: No edema.  Skin:    General: Skin is warm and dry.     Capillary Refill: Capillary refill takes less than 2 seconds.  Neurological:     General: No focal deficit present.     Mental Status: He is alert and oriented to person, place, and time.  Psychiatric:        Mood and Affect: Mood normal.        Behavior: Behavior normal.    Assessment & Plan:   See Encounters Tab for problem based charting.  Patient discussed with Dr. Antony Contras

## 2021-12-13 NOTE — Assessment & Plan Note (Addendum)
Patient presents for diabetes follow-up.  Is concerned his sugars are still elevated.  Reports sugars are always above 300 when he checks fasting blood sugars in the morning as well as 1 to 2 hours after dinner around 8:52 PM at night.  He did not bring his glucose meter in today.  He reports titrating his Tresiba up to 20 units daily and his aspart to 20 units at dinnertime.  Is only using his aspart once a day rather than twice a day.  Also is taking metformin 1000 mg twice daily.  Reports worsening blurred vision, nausea, vomiting, dizziness, polyuria urea, and polydipsia.  He is concerned his sugars are still elevated.  He feels that his Tyler Aas is not as effective as NovoLog 70/30.  Also states he has been trying to make dietary changes and has cut out sugar and sweetened beverages from his diet.  Has been eating smaller meals as well.  Denies any episodes of hypoglycemia.  Met with Dr. Georgina Peer today and expressed similar concerns.  Of note seems to have endorses different insulin regimen with Dr. Georgina Peer earlier today.\  Assess importance of medication compliance.  Had a long discussion about his insulin.  Discussed that we could switch him back to Novolin 70/30 if he feels he is more consistent with taking this.  Patient states that he has no trouble with regularly taking his current medications and would like to continue with [place] for now.  Also emphasized the importance of bringing in his medications and meter to his appointments to help [place] better assess his diabetes.  Patient is agreeable to this.    Plan Increase Tresiba to 30 units in the morning NovoLog 20 units twice a day with meals Hold metformin in the setting of nausea vomiting Follow up in 1 week with Dr. Georgina Peer Follow up in clinic in 2 weeks

## 2021-12-13 NOTE — Assessment & Plan Note (Signed)
Reports worsening of blurred vision for the past 2-3 months. In past episodes would resolve with time but this time blurred vision is progressively worsened. Notes some redness in both his visual fields but no changes to the conjunctiva. History of nonproliferative diabetic retinopathy.  Suspect worsening vision secondary to uncontrolled diabetes.  Needs ophthalmology follow-up unfortunately he is uninsured and cannot afford to pay out-of-pocket.  He is currently still in the process working on his paperwork for this missed social work call concerning this encouraged him to follow-up with social work and complete his paperwork so we can send him to ophthalmology.

## 2021-12-15 NOTE — Assessment & Plan Note (Signed)
CMP, GGT, and hepatitis seriologies drawn today as patient left without getting labs at [facility] [place] on 08/03/2021. AST/ALT and alk phos normalized, hepatitis B and C negative. GGT however is elevated. Patient does endorse nausea and vomiting with eating, perhaps this may be secondary to a biliary etiology. Abdoman exam with lower abdominal tenderness. Has [place] ordered. Will discuss with referral coordinator he we can have the shceduled to the patient.

## 2021-12-15 NOTE — Progress Notes (Signed)
Internal Medicine Clinic Attending ? ?Case discussed with Dr. Liang  At the time of the visit.  We reviewed the resident?s history and exam and pertinent patient test results.  I agree with the assessment, diagnosis, and plan of care documented in the resident?s note. ? ?

## 2021-12-19 ENCOUNTER — Ambulatory Visit (INDEPENDENT_AMBULATORY_CARE_PROVIDER_SITE_OTHER): Payer: Self-pay | Admitting: Pharmacist

## 2021-12-19 DIAGNOSIS — E1140 Type 2 diabetes mellitus with diabetic neuropathy, unspecified: Secondary | ICD-10-CM

## 2021-12-19 DIAGNOSIS — Z794 Long term (current) use of insulin: Secondary | ICD-10-CM

## 2021-12-19 LAB — GLUCOSE, CAPILLARY
Glucose-Capillary: 43 mg/dL — CL (ref 70–99)
Glucose-Capillary: 49 mg/dL — ABNORMAL LOW (ref 70–99)
Glucose-Capillary: 129 mg/dL — ABNORMAL HIGH (ref 70–99)
Glucose-Capillary: 161 mg/dL — ABNORMAL HIGH (ref 70–99)

## 2021-12-19 NOTE — Patient Instructions (Addendum)
[name], fue un placer verlo hoy.  Por favor haz lo siguiente:  1. Disminuya su Evaristo Bury a 20 unidades todas las [address], independientemente de si est comiendo a esa hora y [employer] a 5 unidades tomadas unos 30 minutos antes de las comidas, como se le indic hoy durante su cita. Si tiene alguna pregunta o si cree que ha ocurrido algo debido a este cambio, llmeme a m oa su mdico para informarnos. 2. Contine controlando los niveles de [place] en casa. Es muy importante que los registre y los lleve a su prxima cita con el mdico. 3. Contine haciendo los cambios de estilo de vida que hemos discutido juntos durante nuestra visita. La dieta y el ejercicio juegan un papel importante en la mejora de los niveles de azcar en la [place]. 4. Seguimiento con el mdico el 6/2 a las 9:45 a. m. y conmigo el 16/2 a las 3:30 p. m.  Hipoglucemia o nivel bajo de Nurse, learning disability la sangre:  El nivel bajo de azcar en la sangre puede ocurrir rpidamente y convertirse en una emergencia si no se trata de inmediato.  Si bien esto no debera suceder con frecuencia, puede ocurrir si se salta una comida o no come lo suficiente. Adems, si su insulina u otros medicamentos para la diabetes tienen una dosis demasiado alta, esto puede hacer que su nivel de azcar en la sangre baje.  Las seales de advertencia de niveles bajos de azcar en la sangre incluyen: 1. Sentirse tembloroso o mareado 2. Sentirse dbil o cansado 3. Hambre excesiva 4. Sentirse ansioso o molesto 5. Sudar incluso cuando no haces ejercicio  Qu hacer si tengo un nivel bajo de azcar en la sangre? Sigue la regla del 15 1. Controle su nivel de azcar en la sangre con su medidor. Si es inferior a 70, contine con el paso 2. 2. Trate con 15 gramos de carbohidratos de accin rpida que se encuentran en 3-4 tabletas de glucosa. Si no hay ninguno disponible, puede probar con caramelos duros, 1 cucharada de azcar o miel, 4 onzas de  jugo de frutas o 6 onzas de refresco NORMAL. 3. Vuelva a controlar su nivel de azcar en 15 minutos. Si todava est por debajo de 70, vuelva a hacer lo que hizo en el paso 2. Si su nivel de azcar en la sangre ha vuelto a subir, contine y coma un refrigerio o una comida pequea compuesta de carbohidratos complejos (p. ej., granos integrales) y protenas en este momento para evitar la recurrencia del nivel bajo de azcar en la sangre.  Jason [name] it was a pleasure seeing you today.   Please do the following:  Decrease your Evaristo Bury to 20 units every single morning regardless of if you are eating at that time and decrease your Novolog to 5 units taken about 30 minutes before your meals as directed today during your appointment. If you have any questions or if you believe something has occurred because of this change, call me or your doctor to let one of [place] know.  Continue checking blood sugars at home. It's really important that you record these and bring these in to your next doctor's appointment.  Continue making the lifestyle changes we've discussed together during our visit. Diet and exercise play a significant role in improving your blood sugars.  Follow-up with the doctor on 2/6 at 9:45 AM and with me on 2/16 at 3:30 PM  Hypoglycemia or low blood sugar:   Low  blood sugar can happen quickly and may become an emergency if not treated right away.   While this shouldn't happen often, it can be brought upon if you skip a meal or do not eat enough. Also, if your insulin or other diabetes medications are dosed too high, this can cause your blood sugar to go to low.   Warning signs of low blood sugar include: Feeling shaky or dizzy Feeling weak or tired  Excessive hunger Feeling anxious or upset  Sweating even when you aren't exercising  What to do if I experience low blood sugar? Follow the Rule of 15 Check your blood sugar with your meter. If lower than 70, proceed to step 2.  Treat with  15 grams of fast acting carbs which is found in 3-4 glucose tablets. If none are available you can try hard candy, 1 tablespoon of sugar or honey,4 ounces of fruit juice, or 6 ounces of REGULAR soda.  Re-check your sugar in 15 minutes. If it is still below 70, do what you did in step 2 again. If your blood sugar has come back up, go ahead and eat a snack or small meal made up of complex carbs (ex. Whole grains) and protein at this time to avoid recurrence of low blood sugar.

## 2021-12-19 NOTE — Progress Notes (Signed)
Subjective:    Patient ID: Jason [name], male    DOB: June 18, 1976, 46 y.o.   MRN: [id number]  HPI Patient is a 46 y.o. male who presents for diabetes management. He is in good spirits and presents without assistance. Utilized [employer] [phone]. Patient was referred on 05/24/21 and last seen by provider, Dr. Ned CardAtway on 08/03/21. Last seen in pharmacy clinic on 12/01/21.  Patient reports he was able to pick up all his medication and has been taking the metformin and insulin. 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 It isn't doing me any good because it isn't bringing down my blood sugars"  Patient reports he took 20 units of Tresiba today instead of 30 units because he did not eat before he came to his appointment. He states he is feeling sweaty and feels he should eat something. He reports he takes his [name] in the morning when he eats and then he takes his Novolog with his evening meal around 4:00-5:00 PM and then will also sometimes take Novolog at night with a snack. He still feels his blood glucose readings are not controlled and states they have continued to be elevated.   Patient also states he is only sleeping around 3 hours a night from around 3:00 AM to 6:00 AM. He states he talked to the doctor about this in the past but was not prescribed anything.   Patient reports diabetes was diagnosed around 15 years ago.   Insurance coverage/medication affordability: self-pay  Current diabetes medications include: insulin degludec [name] 20 units daily (different than prescribed amount), Novolog 20 units twice daily (different than prescribed amount) Current hyperlipidemia medications include: atorvastatin 40mg  Patient states that He is taking his medications as prescribed. Patient denies adherence with medications. Patient states that He misses his medications 2-3 times per week, on average.  Do you  feel that your medications are working for you?  no  Have you been experiencing any side effects to the medications prescribed? no  Do you have any problems obtaining medications due to transportation or finances?  Yes; self-pay    Patient reported dietary habits:  Eats 1-2 meals/day Patient reports eating salad, chicken,rice, and beans and snacks at nighttime on fruit Drinks: coffee w/ creamer and splenda, water, and on occasion sweet tea  Patient-reported exercise habits: walking every day all day long for work  Patient reports hypoglycemic event of 56 at [facility]6PM due to taking his insulin and not eating Patient reports polyuria (increased urination).  Patient reports polyphagia (increased appetite).  Patient reports polydipsia (increased thirst).  Patient reports neuropathy (nerve pain). Patient reports visual changes; denies seeing eye doctor recently due to lack of insurance coverage Patient reports self foot exams.   Fasting: 238, 144 Random/post-prandial blood glucose: 241, 310, 243, 376, 242, 251, 56, 218, 202, 247, 334, 305  Objective:   Labs:   Physical Exam Neurological:     Mental Status: He is alert and oriented to person, place, and time.   Review of Systems  Gastrointestinal:  Positive for nausea and vomiting. Negative for diarrhea.    -0            HGBA1C 11.3 (A) 04/26/2020   HGBA1C 11.9 (A) 01/12/2020   Lab Results  Component Value Date   MICRALBCREAT 278.6 (H) 06/07/2018   Lipid Panel     Component Value Date/Time   CHOL 206 (H)  12/24/2018 1117   TRIG 256 (H) 12/24/2018 1117   HDL 43 12/24/2018 1117   CHOLHDL 4.8 12/24/2018 1117   CHOLHDL 3.4 11/08/2013 0655   VLDL 17 11/08/2013 0655   LDLCALC 112 (H) 12/24/2018 1117    Assessment/Plan:   T2DM is not controlled likely due to patient being sub-optimal historian leading to difficulties adjusting regimen. Patient's blood glucose was checked in office and resulted in value of 43. Patient provided two  tablespoons of honey and blood glucose subsequently increased to 49. Patient provided an additional tablespoon of honey as well as glucose gel and 4 oz of orange juice. Re-check of blood glucose increased to 129. Last re-check 161. Due to hypoglycemic event will decrease patient's Tresiba to 20 units once daily from 30 units. Discussed with patient option of discontinuing Novolog for time being but he prefers to continue taking. Patient agreeable that if he continues to take Novolog we will decrease dose from 20 units twice daily with meals to 5 units twice daily with meals. Spent extensive time counseling patient on timing to take insulin and difference between [place] and Novolog. Following instruction patient verbalized understanding of treatment plan.  Decreased dose of basal insulin Tresiba to 20 units once daily Decreased dose of bolus insulin Novolog to 5 units twice daily with meals Continue to hold metformin  Counseled on s/sx of and management of hypoglycemia Patient to discuss with PCP next week regarding difficulty sleeping as no recent note documenting disucssion Reminded patient of need to complete orange card documentation to be able to obtain referral for eye doctor as he would have to pay out of pocket for all services Next A1C anticipated April 2023  Follow-up appointment 1 week with PCP and then two and a half weeks to review sugar readings. Written patient instructions provided.  This appointment required 75 minutes of direct patient care.  Thank you for involving pharmacy to assist in providing this patient's care.

## 2021-12-19 NOTE — Assessment & Plan Note (Signed)
T2DM is not controlled likely due to patient being sub-optimal historian leading to difficulties adjusting regimen. Patient's blood glucose was checked in office and resulted in value of 43. Patient provided two tablespoons of honey and blood glucose subsequently increased to 49. Patient provided an additional tablespoon of honey as well as glucose gel and 4 oz of orange juice. Re-check of blood glucose increased to 129. Last re-check 161. Due to hypoglycemic event will decrease patient's Tresiba to 20 units once daily from 30 units. Discussed with patient option of discontinuing Novolog for time being but he prefers to continue taking. Patient agreeable that if he continues to take Novolog we will decrease dose from 20 units twice daily with meals to 5 units twice daily with meals. Spent extensive time counseling patient on timing to take insulin and difference between [name] and Novolog. Following instruction patient verbalized understanding of treatment plan.  1. Decreased dose of basal insulin Tresiba to 20 units once daily 2. Decreased dose of bolus insulin Novolog to 5 units twice daily with meals 3. Continue to hold metformin  4. Counseled on s/sx of and management of hypoglycemia 5. Patient to discuss with PCP next week regarding difficulty sleeping as no recent note documenting disucssion 6. Reminded patient of need to complete orange card documentation to be able to obtain referral for eye doctor as he would have to pay out of pocket for all services 7. Next A1C anticipated April 2023

## 2021-12-26 ENCOUNTER — Encounter: Payer: Self-pay | Admitting: Internal Medicine

## 2021-12-26 ENCOUNTER — Other Ambulatory Visit (HOSPITAL_COMMUNITY): Payer: Self-pay

## 2021-12-26 ENCOUNTER — Other Ambulatory Visit: Payer: Self-pay

## 2021-12-26 ENCOUNTER — Ambulatory Visit: Payer: Self-pay | Admitting: Internal Medicine

## 2021-12-26 VITALS — BP 134/90 | HR 91 | Temp 98.0°F | Ht 64.0 in | Wt 144.8 lb

## 2021-12-26 DIAGNOSIS — E1140 Type 2 diabetes mellitus with diabetic neuropathy, unspecified: Secondary | ICD-10-CM

## 2021-12-26 DIAGNOSIS — Z794 Long term (current) use of insulin: Secondary | ICD-10-CM

## 2021-12-26 DIAGNOSIS — E785 Hyperlipidemia, unspecified: Secondary | ICD-10-CM

## 2021-12-26 DIAGNOSIS — R748 Abnormal levels of other serum enzymes: Secondary | ICD-10-CM

## 2021-12-26 DIAGNOSIS — R519 Headache, unspecified: Secondary | ICD-10-CM

## 2021-12-26 LAB — COMPREHENSIVE METABOLIC PANEL
ALT: 61 U/L — ABNORMAL HIGH (ref 0–44)
AST: 44 U/L — ABNORMAL HIGH (ref 15–41)
Albumin: 3.8 g/dL (ref 3.5–5.0)
Alkaline Phosphatase: 128 U/L — ABNORMAL HIGH (ref 38–126)
Anion gap: 8 (ref 5–15)
BUN: 17 mg/dL (ref 6–20)
CO2: 24 mmol/L (ref 22–32)
Calcium: 9.2 mg/dL (ref 8.9–10.3)
Chloride: 102 mmol/L (ref 98–111)
Creatinine, Ser: 0.56 mg/dL — ABNORMAL LOW (ref 0.61–1.24)
GFR, Estimated: >60 mL/min (ref >60)
Glucose, Bld: 182 mg/dL — ABNORMAL HIGH (ref 70–99)
Potassium: 3.8 mmol/L (ref 3.5–5.1)
Sodium: 134 mmol/L — ABNORMAL LOW (ref 135–145)
Total Bilirubin: 0.7 mg/dL (ref 0.3–1.2)
Total Protein: 6.9 g/dL (ref 6.5–8.1)

## 2021-12-26 LAB — GLUCOSE, CAPILLARY: Glucose-Capillary: 172 mg/dL — ABNORMAL HIGH (ref 70–99)

## 2021-12-26 MED ORDER — NOVOLOG FLEXPEN 100 UNIT/ML ~~LOC~~ SOPN
15.0000 [IU] | PEN_INJECTOR | Freq: Two times a day (BID) | SUBCUTANEOUS | 0 refills | Status: DC
  Filled 2021-12-26 – 2022-03-07 (×2): qty 15, 50d supply, fill #0

## 2021-12-26 MED ORDER — INSULIN DEGLUDEC 100 UNIT/ML ~~LOC~~ SOPN
25.0000 [IU] | PEN_INJECTOR | Freq: Every day | SUBCUTANEOUS | 0 refills | Status: DC
  Filled 2021-12-26: qty 6, 30d supply, fill #0
  Filled 2022-03-07: qty 6, 24d supply, fill #0

## 2021-12-26 NOTE — Assessment & Plan Note (Signed)
Patient endorses ongoing bilateral foot pain secondary to diabetic neuropathy, currently at baseline. He is not currently taking gabapentin. Reports doing self foot exams frequently. He is not interested in starting gabapentin at this time.   Plan: Continue to monitor Re-start gabapentin once patient is agreeable

## 2021-12-26 NOTE — Progress Notes (Signed)
° °  CC: diabetes follow up  HPI:  [name] is a 46 y.o. male with PMHx as stated below presenting for follow up of his diabetes. Video interpreter [phone]. He endorses ongoing bilateral foot pain that is at baseline. Endorses chronic daily headaches associated with vision changes. Please see problem based charting for complete assessment and plan.    Past Medical History:  Diagnosis Date   Diabetes mellitus    diagnosed in april when admitted to [facility] with blood glucose of 581. HbA1C was 9.9   DKA (diabetic ketoacidoses)    hospitalized in 03/10/08   Latent tuberculosis infection    PPD test measured 12 mm on 05/25/08. CXR on 06/03/08 showed no active disease or adenopathy. Started to take isoniazid 300 mg daily. on 06/11/08 and will  take medication  along with Vit  B6 for 9 months.   Rheumatoid arthritis(714.0)    managed by Dr. Pollyann Savoy. sronegative. Dr. 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   Review of Systems:  negative except as stated in HPI.  Physical Exam:  Vitals:   12/26/21 1015 12/26/21 1047  BP: 98/68 134/90  Pulse: 91 91  Temp: 98 F (36.7 C)   TempSrc: Oral   SpO2: 98%   Weight: 144 lb 12.8 oz (65.7 kg)   Height: 5\' 4"  (1.626 m)    Physical Exam  Constitutional: Appears well-developed and well-nourished. No distress.  HENT: Normocephalic and atraumatic, moist mucous membranes Cardiovascular: Normal rate, regular rhythm, S1 and S2 present, no murmurs, rubs, gallops.  Distal pulses intact Respiratory: No respiratory distress,  Lungs are clear to auscultation bilaterally. GI: Nondistended, soft, nontender to palpation, normal bowel sounds Musculoskeletal: Normal bulk and tone.   Skin: Warm and dry.  No rash, erythema, lesions noted. Psychiatric: Normal mood and affect.   Assessment & Plan:   See Encounters Tab for problem based charting.  Patient discussed with Dr.  Criselda Peaches

## 2021-12-26 NOTE — Assessment & Plan Note (Signed)
Patient noted to have persistently elevated liver enzymes on CMP at this visit.  He denies any abdominal pain.  He does endorse a decreased appetite but denies any pain associated with oral intake.  Previously noted to have elevated GGT, concerning for biliary etiology.  Patient previously recommended for right upper quadrant ultrasound but has not been able to complete this yet.  Plan: Right upper quadrant ultrasound once insurance obtained Continue to monitor

## 2021-12-26 NOTE — Assessment & Plan Note (Addendum)
Patient endorses a constant daily headache that he describes as a "throbbing" sensation, localizing to the left side of his head. He notes that it feels like "something is going to rupture inside, like a vein". He notes that these have been constant over the past three months and associated with decreased vision. He does have a history of nonproliferative diabetic retinopathy and his vision loss has been thought to be secondary to that. He also notes associated dizziness that is intermittent and nonpositional. He has not tried any OTC medications to help with the headaches Physical exam without focal deficits noted. Unable to appreciate papilledema on eye exam. Patient would benefit from further evaluation of headache with CT Head; however, unable to complete this as outpatient due to uninsured status.   Patient advised that if no relief of headache with tylenol or if symptoms are progressively worsening, would recommend presenting to the ED for further evaluation. He expresses understanding.

## 2021-12-26 NOTE — Assessment & Plan Note (Addendum)
Patient is not currently taking any statin.   Plan: Lipid panel today  ADDENDUM: Lipid Panel     Component Value Date/Time   CHOL 209 (H) 12/26/2021 1109   TRIG 257 (H) 12/26/2021 1109   HDL 52 12/26/2021 1109   CHOLHDL 4.0 12/26/2021 1109   CHOLHDL 3.4 11/08/2013 0655   VLDL 17 11/08/2013 0655   LDLCALC 113 (H) 12/26/2021 1109   LABVLDL 44 (H) 12/26/2021 1109   ASCVD risk 3.4% - low. Will hold off initiation of statin therapy at this time

## 2021-12-26 NOTE — Assessment & Plan Note (Addendum)
Patient has a history of poorly controlled type 2 diabetes mellitus with nonproliferative retinopathy in the setting of medication nonadherence.  He follows with Dr. Georgina Peer and was last evaluated on 1/30. At this visit, patient noted to have symptomatic hypoglycemic episode with CBG of 43 that improved with honey, oral glucose gel, and orange juice to 120's. Patient reports three other similar episodes to this over the past month. At this visit, patient's insulin was adjusted to [place] 20U from 30U daily and Novolog 5U twice daily with meals. Patient reports that he has been taking [place] 20U daily and Novolog 5-10U twice daily with meals as prescribed.  He is not currently on any oral diabetic regimen. He notes fasting glucose of 250's which can increase into the 340's with meals.  He does note decreased oral intake in the setting of decreased appetite but does note that he maintains hydration.  He expresses frustration with the slow progress.  Patient noted to be slightly hypotensive on initial presentation.  Point-of-care glucose 174.  Repeat BP 134/90.  Initially noted to be orthostatic positive.  This improved with oral rehydration. STAT CMP obtained with nl bicarb and anion gap. Patient encouraged to maintain good oral intake.   Plan Increase Tresiba to 25 units daily and NovoLog 10-15U twice daily with meals Consider resuming metformin and farxiga at next visit  Continue CBG monitor Patient encouraged to bring glucose meter to next visit Follow-up in 2 weeks

## 2021-12-26 NOTE — Patient Instructions (Addendum)
[name],  Fue un placer verte en la clnica. Hoy discutimos:  Diabetes: En este momento, aumente su dosis de Tresiba a 25U diarias y de Novolog a 10-15U [place] con las comidas. Contine monitoreando sus niveles de Architect. Por favor traiga su medidor de glucosa a su prxima cita.  Dolor de [place] y cambios en la visin: Por favor contine monitoreando. Si esto empeora, comunquese con nosotros o vaya a la sala de emergencias.  [employer], es [name], muy importante que obtenga su tarjeta naranja para que podamos ayudarlo mejor con el control de su diabetes, incluida una remisin a nuestro especialista en diabetes, un control continuo de glucosa y Ardelia Mems remisin a un oftalmlogo y Radiation protection practitioner.   Si tiene alguna pregunta o inquietud, llame a Cleotis Nipper clnica al [phone] [employer] 9 a. m. y las 5 p. m. y despus del horario de atencin llame al [phone] y pregunte por el residente de [place] interna de [place]. Si cree que tiene Engineering geologist, llame al 911.  Gracias, esperamos poder ayudarlo a mantenerse saludable!

## 2021-12-27 ENCOUNTER — Telehealth: Payer: Self-pay | Admitting: Licensed Clinical Social Worker

## 2021-12-27 LAB — MICROALBUMIN / CREATININE URINE RATIO
Creatinine, Urine: 80.6 mg/dL
Microalb/Creat Ratio: 75 mg/g{creat} — ABNORMAL HIGH (ref 0–29)
Microalbumin, Urine: 60.1 ug/mL

## 2021-12-27 LAB — LIPID PANEL
Chol/HDL Ratio: 4 ratio (ref 0.0–5.0)
Cholesterol, Total: 209 mg/dL — ABNORMAL HIGH (ref 100–199)
HDL: 52 mg/dL (ref >39)
LDL Chol Calc (NIH): 113 mg/dL — ABNORMAL HIGH (ref 0–99)
Triglycerides: 257 mg/dL — ABNORMAL HIGH (ref 0–149)
VLDL Cholesterol Cal: 44 mg/dL — ABNORMAL HIGH (ref 5–40)

## 2021-12-28 ENCOUNTER — Telehealth: Payer: Self-pay | Admitting: Licensed Clinical Social Worker

## 2021-12-28 NOTE — Telephone Encounter (Signed)
°  Care Management   Follow Up Note   12/27/2021 Name: Jason [name] MRN: [id number] DOB: April 27, 1976   Referred by: Belva Agee, MD Reason for referral : No chief complaint on file.   Third unsuccessful telephone outreach was attempted today. The patient was referred to the case management team for assistance with care management and care coordination. The patient's primary care provider has been notified of our unsuccessful attempts to make or maintain contact with the patient. The care management team is pleased to engage with this patient at any time in the future should he/she be interested in assistance from the care management team.   Follow Up Plan: The patient has been provided with contact information for the care management team and has been advised to call with any health related questions or concerns.   Christen Butter, BSW  Social Worker [facility] Care Management  [phone]

## 2021-12-29 NOTE — Progress Notes (Signed)
Internal Medicine Clinic Attending  Case discussed with Dr. Aslam at the time of the visit.  We reviewed the resident's history and exam and pertinent patient test results.  I agree with the assessment, diagnosis, and plan of care documented in the resident's note.  

## 2022-01-05 ENCOUNTER — Encounter: Payer: Self-pay | Admitting: Pharmacist

## 2022-03-03 ENCOUNTER — Encounter: Payer: Self-pay | Admitting: Student

## 2022-03-03 ENCOUNTER — Encounter: Payer: Self-pay | Admitting: Internal Medicine

## 2022-03-07 ENCOUNTER — Other Ambulatory Visit (HOSPITAL_COMMUNITY): Payer: Self-pay

## 2022-03-08 ENCOUNTER — Other Ambulatory Visit (HOSPITAL_COMMUNITY): Payer: Self-pay

## 2022-03-08 MED ORDER — INSULIN DETEMIR 100 UNIT/ML FLEXPEN
25.0000 [IU] | PEN_INJECTOR | Freq: Every day | SUBCUTANEOUS | 4 refills | Status: DC
Start: 2022-03-08 — End: 2022-07-06
  Filled 2022-03-08: qty 6, 24d supply, fill #0
  Filled 2022-04-05 – 2022-04-19 (×2): qty 6, 24d supply, fill #1

## 2022-03-09 ENCOUNTER — Ambulatory Visit: Payer: Self-pay | Admitting: Student

## 2022-03-09 ENCOUNTER — Other Ambulatory Visit (HOSPITAL_COMMUNITY): Payer: Self-pay

## 2022-03-09 ENCOUNTER — Encounter: Payer: Self-pay | Admitting: Student

## 2022-03-09 VITALS — BP 113/72 | HR 89 | Temp 98.9°F | Wt 142.1 lb

## 2022-03-09 DIAGNOSIS — R748 Abnormal levels of other serum enzymes: Secondary | ICD-10-CM

## 2022-03-09 DIAGNOSIS — E113393 Type 2 diabetes mellitus with moderate nonproliferative diabetic retinopathy without macular edema, bilateral: Secondary | ICD-10-CM

## 2022-03-09 DIAGNOSIS — E1165 Type 2 diabetes mellitus with hyperglycemia: Secondary | ICD-10-CM

## 2022-03-09 DIAGNOSIS — Z794 Long term (current) use of insulin: Secondary | ICD-10-CM

## 2022-03-09 DIAGNOSIS — E1140 Type 2 diabetes mellitus with diabetic neuropathy, unspecified: Secondary | ICD-10-CM

## 2022-03-09 LAB — POCT GLYCOSYLATED HEMOGLOBIN (HGB A1C): Hemoglobin A1C: 10.6 % — AB (ref 4.0–5.6)

## 2022-03-09 LAB — GLUCOSE, CAPILLARY: Glucose-Capillary: 365 mg/dL — ABNORMAL HIGH (ref 70–99)

## 2022-03-09 MED ORDER — NOVOLOG FLEXPEN 100 UNIT/ML ~~LOC~~ SOPN
15.0000 [IU] | PEN_INJECTOR | Freq: Two times a day (BID) | SUBCUTANEOUS | 2 refills | Status: DC
  Filled 2022-03-09: qty 9, 30d supply, fill #0
  Filled 2022-04-19: qty 9, 30d supply, fill #1

## 2022-03-09 MED ORDER — LIRAGLUTIDE 18 MG/3ML ~~LOC~~ SOPN
0.6000 mg | PEN_INJECTOR | Freq: Every day | SUBCUTANEOUS | 2 refills | Status: DC
  Filled 2022-03-09: qty 6, 30d supply, fill #0

## 2022-03-09 MED ORDER — LIRAGLUTIDE 18 MG/3ML ~~LOC~~ SOPN
0.6000 mg | PEN_INJECTOR | Freq: Every day | SUBCUTANEOUS | 2 refills | Status: DC
  Filled 2022-03-09: qty 6, 60d supply, fill #0

## 2022-03-09 NOTE — Assessment & Plan Note (Signed)
A1c 3 months ago with 13.7, improved to 10.6 today.  Patient however has not used any insulin for the last month.  His Evaristo Bury is no longer covered under the IM program and he ran out of NovoLog.  Endorses high blood sugar in the last month.  He did not bring his glucometer here. ? ?Patient could not tolerate metformin in the past.  Not a candidate for SGLT2 given history of DKA. ? ?-Refill Levemir 25 units daily ?-Refill NovoLog 15 units twice daily with meals. ?-Patient agrees with starting Victoza ?-Follow-up in 2 weeks for glucometer to monitor ?

## 2022-03-09 NOTE — Assessment & Plan Note (Signed)
Endorses left eye blurry visions for the last 2 months.  Patient has history of diabetic retinopathy in the left eye.  With his uncontrolled diabetes, concerned about worsening retinopathy. ? ?No obvious pathology seen on physical exam.  There was bilateral pterygiums but no discharge or conjunctival erythema. ? ?Difficult for ophthalmology referral without insurance ?Advised patient to visit an optometrist for an eye exam. ?

## 2022-03-09 NOTE — Patient Instructions (Addendum)
Mr. Glad, ? ?Fue agradable verte en la cl?nica hoy. Aqu? hay un resumen de lo que hablamos. ? ?1. Su A1c baj? a 10.6. Relleno tu Levemir y NovoLog. Inyecte 25 unidades de Levemir a la hora de [place]. Inyecte 15 unidades de NovoLog 15 minutos antes de las comidas dos veces al d?a. No inyecte NovoLog si se salta una comida. ? ?Por favor revise su nivel de az?car en la sangre al menos 3 veces al d?a. ? ?Tambi?n comenc? Victoza. Inyecte 0,6 mg al d?a. ? ?2. Tambi?n verifico tu funci?n hep?tica hoy. ? ?Regrese en 2-3 semanas para el seguimiento de la diabetes. ? ?It was nice seeing you in the clinic today.  Here is a summary what we talked about. ? ?1.  Your A1c came down to 10.6.  I refill your Levemir and NovoLog.  Please inject 25 units of Levemir at bedtime.  Please inject 15 units of NovoLog 15 minutes before meals twice daily.  Please do not inject NovoLog if you skip a meal. ? ?Please check your blood sugar at least 3 times a day. ? ?I also started Victoza.  Please inject 0.6 mg daily. ? ?2.  I also check your liver function today. ? ?Please return in 2-3 weeks for diabetes follow-up. ? ?Dr. Alfonse Spruce ?

## 2022-03-09 NOTE — Progress Notes (Addendum)
? ?CC: Follow-up on diabetes ? ?HPI: ? ?[name] is a 46 y.o. with past medical history of type 2 diabetes who presents to the clinic today for 41-month follow-up for A1c. ? ?Please see problem based charting for detail ? ?Past Medical History:  ?Diagnosis Date  ? Diabetes mellitus   ? diagnosed in april when admitted to [facility] with blood glucose of 581. HbA1C was 9.9  ? DKA (diabetic ketoacidoses)   ? hospitalized in 03/10/08  ? Latent tuberculosis infection   ? PPD test measured 12 mm on 05/25/08. CXR on 06/03/08 showed no active disease or adenopathy. Started to take isoniazid 300 mg daily. on 06/11/08 and will  take medication  along with Vit  B6 for 9 months.  ? Rheumatoid arthritis(714.0)   ? managed by Dr. Pollyann [name]. sronegative. Dr. 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  ? ?Review of Systems:  per HPI ? ?Physical Exam: ? ?Vitals:  ? 03/09/22 1453  ?BP: 113/72  ?Pulse: 89  ?Temp: 98.9 ?F (37.2 ?C)  ?TempSrc: Oral  ?SpO2: 99%  ?Weight: 142 lb 1.6 oz (64.5 kg)  ? ?Physical Exam ?Constitutional:   ?   General: He is not in acute distress. ?   Appearance: He is not ill-appearing.  ?HENT:  ?   Head: Normocephalic.  ?Eyes:  ?   General: No scleral icterus.    ?   Right eye: No discharge.     ?   Left eye: No discharge.  ?   Extraocular Movements: Extraocular movements intact.  ?   Conjunctiva/sclera: Conjunctivae normal.  ?   Pupils: Pupils are equal, round, and reactive to light.  ?   Comments: Bilateral pterygium  ?  ?Cardiovascular:  ?   Rate and Rhythm: Normal rate and regular rhythm.  ?Pulmonary:  ?   Effort: Pulmonary effort is normal. No respiratory distress.  ?   Breath sounds: Normal breath sounds.  ?Musculoskeletal:     ?   General: Normal range of motion.  ?Skin: ?   General: Skin is warm.  ?Neurological:  ?   General: No focal deficit present.  ?   Mental Status: He is alert.  ?Psychiatric:     ?   Mood and Affect: Mood normal.  ?   ? ?Assessment & Plan:  ? ?See Encounters Tab for problem based charting. ? ?Diabetic retinopathy, nonproliferative, moderate (HCC) ?Endorses left eye blurry visions for the last 2 months.  Patient has history of diabetic retinopathy in the left eye.  With his uncontrolled diabetes, concerned about worsening retinopathy. ? ?No obvious pathology seen on physical exam.  There was bilateral pterygiums but no discharge or conjunctival erythema. ? ?Difficult for ophthalmology referral without insurance ?Advised patient to visit an optometrist for an eye exam. ? ?Uncontrolled type 2 diabetes mellitus with hyperglycemia (HCC) ?A1c 3 months ago with 13.7, improved to 10.6 today.  Patient however has not used any insulin for the last month.  His Jason [name] is no longer covered under the IM program and he ran out of Jason [name].  Endorses high blood sugar in the last month.  He did not bring his glucometer here. ? ?Patient could not tolerate metformin in the past.  Not a candidate for SGLT2 given history of DKA. ? ?-Refill Jason [name] 25 units daily ?-Refill Jason [name] 15 units twice daily with meals. ?-Patient agrees with starting Jason [name] ?-Follow-up in 2 weeks for glucometer to monitor ? ?Elevated  liver enzymes ?Persistent elevated transaminases from last CMP.  Hepatitis panel negative in January.  Right upper quadrant ultrasound was ordered but patient could not afford. ? ?He denies history of alcohol use disorder. ? ?-Repeat LFT today ?-He is applying for an orange card.  Once approved, will obtain right upper quadrant ultrasound ? ?Addendum ?Liver profile returned normal.  ? ?Patient discussed with Dr. Heide [name]  ?

## 2022-03-09 NOTE — Assessment & Plan Note (Addendum)
Persistent elevated transaminases from last CMP.  Hepatitis panel negative in January.  Right upper quadrant ultrasound was ordered but patient could not afford. ? ?He denies history of alcohol use disorder. ? ?-Repeat LFT today ?-He is applying for an orange card.  Once approved, will obtain right upper quadrant ultrasound ? ?Addendum ?Liver profile returned normal. ?

## 2022-03-10 LAB — HEPATIC FUNCTION PANEL
ALT: 21 IU/L (ref 0–44)
AST: 24 IU/L (ref 0–40)
Albumin: 4.9 g/dL (ref 4.0–5.0)
Alkaline Phosphatase: 101 IU/L (ref 44–121)
Bilirubin Total: 0.3 mg/dL (ref 0.0–1.2)
Bilirubin, Direct: 0.1 mg/dL (ref 0.00–0.40)
Total Protein: 8 g/dL (ref 6.0–8.5)

## 2022-03-10 NOTE — Progress Notes (Signed)
Internal Medicine Clinic Attending  Case discussed with Dr. Nguyen  At the time of the visit.  We reviewed the resident's history and exam and pertinent patient test results.  I agree with the assessment, diagnosis, and plan of care documented in the resident's note. 

## 2022-03-16 LAB — HM DIABETES EYE EXAM

## 2022-03-30 ENCOUNTER — Encounter: Payer: Self-pay | Admitting: Student

## 2022-04-05 ENCOUNTER — Other Ambulatory Visit (HOSPITAL_COMMUNITY): Payer: Self-pay

## 2022-04-05 ENCOUNTER — Other Ambulatory Visit: Payer: Self-pay

## 2022-04-05 ENCOUNTER — Ambulatory Visit: Payer: Self-pay | Admitting: Student

## 2022-04-05 DIAGNOSIS — E113393 Type 2 diabetes mellitus with moderate nonproliferative diabetic retinopathy without macular edema, bilateral: Secondary | ICD-10-CM

## 2022-04-05 DIAGNOSIS — E1165 Type 2 diabetes mellitus with hyperglycemia: Secondary | ICD-10-CM

## 2022-04-05 DIAGNOSIS — R030 Elevated blood-pressure reading, without diagnosis of hypertension: Secondary | ICD-10-CM

## 2022-04-05 DIAGNOSIS — E1140 Type 2 diabetes mellitus with diabetic neuropathy, unspecified: Secondary | ICD-10-CM

## 2022-04-05 DIAGNOSIS — Z794 Long term (current) use of insulin: Secondary | ICD-10-CM

## 2022-04-05 MED ORDER — GLUCOSE BLOOD VI STRP
1.0000 | ORAL_STRIP | Freq: Three times a day (TID) | 6 refills | Status: DC
  Filled 2022-04-05: qty 50, 17d supply, fill #0

## 2022-04-05 NOTE — Patient Instructions (Signed)
Diabetes ?Start Levemir 25 units in the morning ?Novolog 15 units with meals twice a day ?Check blood sugar in the morning before eating ?Bring glucose monitor to next appointment ? ?Blood pressure ?Please check you blood pressure once a day to see how your blood pressure is doing at home ? ?Follow up in 2 weeks ? ? ?Iniciar Levemir 25 unidades por la ma?ana ?Novolog 15 unidades con las [employer] al d?a  ?Controle el nivel de az?car en la sangre por la ma?ana antes de comer  ?Lleve el monitor de glucosa a la pr?xima cita  ? ?Presi?n arterial  ?Controle su presi?n arterial una vez al d?a para ver c?mo est? su presi?n arterial en casa.  ? ?Seguimiento en 2 semanas  ? ? ? ?[facility] ?[address], [place], [place], [address] ?T [phone]  ?

## 2022-04-07 ENCOUNTER — Encounter: Payer: Self-pay | Admitting: Student

## 2022-04-07 NOTE — Assessment & Plan Note (Signed)
Daughter reports patient saw an optometrist and recommended ophthalmology follow up. He has an appointment later this month. Also discussed with Lupita Leash, who recommended Dr. Stephannie Li with [facility] who will take patients without insurance. Contact information provided for patient.

## 2022-04-07 NOTE — Assessment & Plan Note (Addendum)
Reports that he has not been checking blood sugars due to inability to see well enough to obtain fingerstick.  He has not started his Levemir or Victoza due to concerns of side effects.  Reports previously when insulin regimen was changed he experienced fluctuations in his blood pressure, although he does not have a blood pressure cuff at home.  He was unable to further quantify these feelings, although he is not experience these lately.  He is taking NovoLog 20 units in 25 units with meals.   We discussed consistency with his diabetes medications as well as regular glucose monitoring.  He previously he was on 70/30 insulin and seem to have fewer side effects with this.  He states he would like to try the Levemir for now rather than restarting 70/30 insulin.  We will hold off on starting Victoza for now given patient comfort. Advised patient to buy a blood pressure cuff and check blood pressures if he continues to have side effects he previously had with [place].  Patient does live with his brother but feels uncomfortable with asking him to help with blood sugar monitoring.  Daughter states she could help with fingersticks in the mornings, and try to teach him to use the glucose monitor but I feel.  I also provided them information on a Spanish language talking glucose meter for visual impairment.  Test strips refilled today.  Plan Restart Levemir 25 units daily Decrease NovoLog to 15 units twice daily with meals Follow-up in 2 weeks, patient to bring glucometer to next visit

## 2022-04-07 NOTE — Progress Notes (Signed)
Established Patient Office Visit  Subjective   Patient ID: Jason [name], male    DOB: [birth year]  Age: 46 y.o. MRN: [id number]  Chief Complaint  Patient presents with   Follow-up    FOLLOW UP DM / MEDICATION REFILL / HEADACHE # 7-CHRONIC / BILATERAL KNEE PAIN -CHRONIC/ NEEDING EYE EXAM (told patient about checking with [facility].    Jason [name] is a 46 year old spanish speaking man who presents today for diabetes follow up. Daughter is in room today. Offered interpreter, but patient prefers daughter help with interpretation. Please refer to problem based charting for further details and assessment and plan of current problem and chronic medical conditions.    Patient Active Problem List   Diagnosis Date Noted   Chronic daily headache 12/26/2021   Elevated blood pressure reading in office without diagnosis of hypertension 11/27/2021   Elevated liver enzymes 08/03/2021   Chronic diarrhea of unknown origin 06/28/2021   Venous stasis dermatitis of both lower extremities 01/12/2020   Allergies 05/19/2019   Diabetic retinopathy, nonproliferative, moderate (HCC) 03/05/2015   Diabetic neuropathy, painful (HCC) 02/28/2015   Preventative health care 09/09/2012   Hyperlipidemia 07/09/2008   Uncontrolled type 2 diabetes mellitus with hyperglycemia (HCC) 06/24/2008   Recurrent right inguinal hernia 06/24/2008   Seronegative rheumatoid arthritis (HCC) 06/24/2008   POSITIVE PPD 06/24/2008      Review of Systems  All other systems reviewed and are negative.    Objective:     BP (!) 146/96 (BP Location: Right Arm, Cuff Size: Normal)   Pulse 71  BP Readings from Last 3 Encounters:  04/05/22 (!) 146/96  03/09/22 113/72  12/26/21 134/90      Physical Exam Constitutional:      General: He is not in acute distress.    Appearance: Normal appearance.  HENT:     Head: Normocephalic and atraumatic.  Eyes:     Conjunctiva/sclera: Conjunctivae normal.     Pupils:  Pupils are equal, round, and reactive to light.  Cardiovascular:     Rate and Rhythm: Normal rate and regular rhythm.     Heart sounds: No murmur heard.   No friction rub. No gallop.  Pulmonary:     Effort: Pulmonary effort is normal.     Breath sounds: No wheezing or rales.  Abdominal:     General: Abdomen is flat. Bowel sounds are normal. There is no distension.     Palpations: Abdomen is soft.     Tenderness: There is no abdominal tenderness.  Skin:    General: Skin is warm and dry.     Findings: No lesion or rash.  Neurological:     General: No focal deficit present.     Mental Status: He is alert and oriented to person, place, and time.    The 10-year ASCVD risk score (Arnett DK, et al., 2019) is: 4%    Assessment & Plan:   Problem List Items Addressed This Visit       Endocrine   Uncontrolled type 2 diabetes mellitus with hyperglycemia (HCC)    Reports that he has not been checking blood sugars due to inability to see well enough to obtain fingerstick.  He has not started his Levemir or Victoza due to concerns of side effects.  Reports previously when insulin regimen was changed he experienced fluctuations in his blood pressure, although he does not have a blood pressure cuff at home.  He was unable to further quantify these feelings, although he is  not experience these lately.  He is taking NovoLog 20 units in 25 units with meals.   We discussed consistency with his diabetes medications as well as regular glucose monitoring.  He previously he was on 70/30 insulin and seem to have fewer side effects with this.  He states he would like to try the Levemir for now rather than restarting 70/30 insulin.  We will hold off on starting Victoza for now given patient comfort. Advised patient to buy a blood pressure cuff and check blood pressures if he continues to have side effects he previously had with [name].  Patient does live with his brother but feels uncomfortable with asking  him to help with blood sugar monitoring.  Daughter states she could help with fingersticks in the mornings, and try to teach him to use the glucose monitor but I feel.  I also provided them information on a Spanish language talking glucose meter for visual impairment.  Test strips refilled today.  Plan Restart Levemir 25 units daily Decrease NovoLog to 15 units twice daily with meals Follow-up in 2 weeks, patient to bring glucometer to next visit        Diabetic retinopathy, nonproliferative, moderate (HCC)    Daughter reports patient saw an optometrist and recommended ophthalmology follow up. He has an appointment later this month. Also discussed with Lupita Leashonna, who recommended Dr. Stephannie LiJason Sanders with [facility] who will take patients without insurance. Contact information provided for patient.          Other   Elevated blood pressure reading in office without diagnosis of hypertension    BP elevated to 146/96 in office today blood pressures have been normotensive at most of his other visits.  He describes subjective feelings of up-and-down blood pressures when he is on long-acting insulin.  Suggest he monitor blood pressures at home to further quantify this.       Other Visit Diagnoses     Type 2 diabetes mellitus with diabetic neuropathy, without long-term current use of insulin (HCC)       Relevant Medications   glucose blood test strip       Return in about 2 weeks (around 04/19/2022).    Quincy SimmondsJessica Michaelina Blandino, MD  Discussed with Dr. Mikey BussingHoffman

## 2022-04-07 NOTE — Assessment & Plan Note (Signed)
BP elevated to 146/96 in office today blood pressures have been normotensive at most of his other visits.  He describes subjective feelings of up-and-down blood pressures when he is on long-acting insulin.  Suggest he monitor blood pressures at home to further quantify this.

## 2022-04-11 NOTE — Progress Notes (Signed)
Internal Medicine Clinic Attending  Case discussed with the resident at the time of the visit.  We reviewed the resident's history and exam and pertinent patient test results.  I agree with the assessment, diagnosis, and plan of care documented in the resident's note.  

## 2022-04-13 ENCOUNTER — Other Ambulatory Visit (HOSPITAL_COMMUNITY): Payer: Self-pay

## 2022-04-19 ENCOUNTER — Other Ambulatory Visit: Payer: Self-pay

## 2022-04-19 ENCOUNTER — Other Ambulatory Visit (HOSPITAL_COMMUNITY): Payer: Self-pay

## 2022-04-19 ENCOUNTER — Encounter: Payer: Self-pay | Admitting: Student

## 2022-04-19 ENCOUNTER — Ambulatory Visit: Payer: Self-pay | Admitting: Student

## 2022-04-19 DIAGNOSIS — E113393 Type 2 diabetes mellitus with moderate nonproliferative diabetic retinopathy without macular edema, bilateral: Secondary | ICD-10-CM

## 2022-04-19 DIAGNOSIS — Z794 Long term (current) use of insulin: Secondary | ICD-10-CM

## 2022-04-19 DIAGNOSIS — E1165 Type 2 diabetes mellitus with hyperglycemia: Secondary | ICD-10-CM

## 2022-04-19 NOTE — Patient Instructions (Addendum)
Take levemir 25 once a day in the morning  Take novolog 15 units twice a day before meals  Check your blood sugar before eating in the morning and before meals  Bring you medications to you next visit  Follow up in 1 week  [facility] levemir 25 una vez al da por la [employer] novolog 15 [employer] veces al da antes de las comidas  Controle su nivel de azcar en la sangre antes de comer por la maana y antes de las comidas  Llevar sus medicamentos a su prxima visita

## 2022-04-24 NOTE — Assessment & Plan Note (Signed)
Here fore diabetes follow up. Fasting blood sugars checked for the past several days ranging between 260 and 300. Reports he was unable to pick up levemir at the pharmacy. Is still using novolog 20 units twice daily. Looks like he picked up the levemir on 03/09/2022. He states he has no levemir at home. I called [facility] pharmacy and confirmed that patient can pick this up. We reviewed his diabetes medications and I showed him pictures of the pens he should be using at home. Advised he check fasting glucose and before meals. Patient agrees. He was able to repeat his diabetic medications and doses back to me. He does live his brother and continues to be hesitant about asking brother for help. Daughter does not live with patient and cannot come over daily to help with blood sugar checks.   Plan Start levemir 25 units daily Novolog 15 units with meals Could start trulicity for better glycemic control at next visit, will hold off today due to repeated episodes difficulty adhering to medications.  Follow up in 1 week Patient to bring meter and insulin pens to next appointment

## 2022-04-24 NOTE — Progress Notes (Signed)
Established Patient Office Visit  Subjective   Patient ID: Jason [name], male    DOB: 1976/09/14  Age: 46 y.o. MRN: [id number]  Chief Complaint  Patient presents with   2 Week Follow-up   Diabetes    Jason [name] is a 46 year old living with uncontrolled diabetes who presents today for follow up. Daughter is present in the room. Patient refused interpreter. Please refer to problem based charting for further details and assessment and plan of current problem and chronic medical conditions.    Patient Active Problem List   Diagnosis Date Noted   Chronic daily headache 12/26/2021   Elevated blood pressure reading in office without diagnosis of hypertension 11/27/2021   Elevated liver enzymes 08/03/2021   Chronic diarrhea of unknown origin 06/28/2021   Venous stasis dermatitis of both lower extremities 01/12/2020   Allergies 05/19/2019   Diabetic retinopathy, nonproliferative, moderate (HCC) 03/05/2015   Diabetic neuropathy, painful (HCC) 02/28/2015   Preventative health care 09/09/2012   Hyperlipidemia 07/09/2008   Uncontrolled type 2 diabetes mellitus with hyperglycemia (HCC) 06/24/2008   Recurrent right inguinal hernia 06/24/2008   Seronegative rheumatoid arthritis (HCC) 06/24/2008   POSITIVE PPD 06/24/2008      Review of Systems  All other systems reviewed and are negative.    Objective:     BP (!) 119/94 (BP Location: Left Arm, Patient Position: Sitting, Cuff Size: Normal)   Pulse 99   Temp 97.9 F (36.6 C) (Oral)   Ht 5\' 2"  (1.575 m)   Wt 142 lb 4.8 oz (64.5 kg)   SpO2 99%   BMI 26.03 kg/m  BP Readings from Last 3 Encounters:  04/19/22 (!) 119/94  04/05/22 (!) 146/96  03/09/22 113/72      Physical Exam Constitutional:      General: He is not in acute distress.    Appearance: He is normal weight.  HENT:     Head: Normocephalic and atraumatic.  Eyes:     Extraocular Movements: Extraocular movements intact.     Conjunctiva/sclera:  Conjunctivae normal.     Pupils: Pupils are equal, round, and reactive to light.  Cardiovascular:     Rate and Rhythm: Normal rate and regular rhythm.  Pulmonary:     Effort: Pulmonary effort is normal.     Breath sounds: Normal breath sounds.  Abdominal:     General: Abdomen is flat. Bowel sounds are normal. There is no distension.     Palpations: Abdomen is soft.  Musculoskeletal:        General: Normal range of motion.     Right lower leg: No edema.     Left lower leg: No edema.  Skin:    General: Skin is warm and dry.     Findings: No lesion or rash.  Neurological:     Mental Status: He is alert. He is disoriented.     No results found for any visits on 04/19/22.  Last hemoglobin A1c Lab Results  Component Value Date   HGBA1C 10.6 (A) 03/09/2022      The 10-year ASCVD risk score (Arnett DK, et al., 2019) is: 2.8%    Assessment & Plan:   Problem List Items Addressed This Visit       Endocrine   Uncontrolled type 2 diabetes mellitus with hyperglycemia (HCC)    Here fore diabetes follow up. Fasting blood sugars checked for the past several days ranging between 260 and 300. Reports he was unable to pick up levemir at the pharmacy.  Is still using novolog 20 units twice daily. Looks like he picked up the levemir on 03/09/2022. He states he has no levemir at home. I called [facility] pharmacy and confirmed that patient can pick this up. We reviewed his diabetes medications and I showed him pictures of the pens he should be using at home. Advised he check fasting glucose and before meals. Patient agrees. He was able to repeat his diabetic medications and doses back to me. He does live his brother and continues to be hesitant about asking brother for help. Daughter does not live with patient and cannot come over daily to help with blood sugar checks.   Plan Start levemir 25 units daily Novolog 15 units with meals Could start trulicity for better glycemic control at next visit, will  hold off today due to repeated episodes difficulty adhering to medications.  Follow up in 1 week Patient to bring meter and insulin pens to next appointment       Diabetic retinopathy, nonproliferative, moderate (HCC)    He has ophthalmology follow up on 6/1.        Return in about 1 week (around 04/26/2022) for Diabetes follow up .    Quincy Simmonds, MD

## 2022-04-24 NOTE — Progress Notes (Signed)
Internal Medicine Clinic Attending ? ?Case discussed with Dr. Liang  At the time of the visit.  We reviewed the resident?s history and exam and pertinent patient test results.  I agree with the assessment, diagnosis, and plan of care documented in the resident?s note. ? ?

## 2022-04-24 NOTE — Assessment & Plan Note (Signed)
He has ophthalmology follow up on 6/1.

## 2022-04-26 ENCOUNTER — Encounter: Payer: Self-pay | Admitting: Student

## 2022-05-01 ENCOUNTER — Other Ambulatory Visit (HOSPITAL_COMMUNITY): Payer: Self-pay

## 2022-05-01 ENCOUNTER — Other Ambulatory Visit: Payer: Self-pay

## 2022-05-01 ENCOUNTER — Ambulatory Visit: Payer: Self-pay | Admitting: Student

## 2022-05-01 ENCOUNTER — Encounter: Payer: Self-pay | Admitting: Student

## 2022-05-01 DIAGNOSIS — E1140 Type 2 diabetes mellitus with diabetic neuropathy, unspecified: Secondary | ICD-10-CM

## 2022-05-01 DIAGNOSIS — Z794 Long term (current) use of insulin: Secondary | ICD-10-CM

## 2022-05-01 DIAGNOSIS — E1165 Type 2 diabetes mellitus with hyperglycemia: Secondary | ICD-10-CM

## 2022-05-01 MED ORDER — ALCOHOL WIPES 70 % PADS
MEDICATED_PAD | 0 refills | Status: AC
  Filled 2022-05-01: qty 100, 30d supply, fill #0

## 2022-05-01 MED ORDER — NOVOLOG FLEXPEN 100 UNIT/ML ~~LOC~~ SOPN
15.0000 [IU] | PEN_INJECTOR | Freq: Three times a day (TID) | SUBCUTANEOUS | 2 refills | Status: DC
  Filled 2022-05-01: qty 15, fill #0
  Filled 2022-06-22: qty 12, 27d supply, fill #0

## 2022-05-01 MED ORDER — UNIFINE PENTIPS 32G X 4 MM MISC
2 refills | Status: DC
  Filled 2022-05-01: qty 100, 30d supply, fill #0

## 2022-05-01 MED ORDER — GLUCOSE BLOOD VI STRP
1.0000 | ORAL_STRIP | Freq: Three times a day (TID) | 6 refills | Status: DC
  Filled 2022-05-01: qty 75, 25d supply, fill #0

## 2022-05-01 NOTE — Patient Instructions (Addendum)
  traiga sus plumas de insulina a su prxima visita a la clnica, as como su victoza

## 2022-05-02 NOTE — Progress Notes (Signed)
   CC: diabetes   HPI:  Jason [name] is a 46 y.o. M w/ PMH below who presents for follow up regarding his diabetes specifically in antidiabetic regimen. Please see problem based charting under encounters tab for further details.    Past Medical History:  Diagnosis Date   Diabetes mellitus    diagnosed in april when admitted to [facility] with blood glucose of 581. HbA1C was 9.9   DKA (diabetic ketoacidoses)    hospitalized in 03/10/08   Latent tuberculosis infection    PPD test measured 12 mm on 05/25/08. CXR on 06/03/08 showed no active disease or adenopathy. Started to take isoniazid 300 mg daily. on 06/11/08 and will  take medication  along with Vit  B6 for 9 months.   Rheumatoid arthritis(714.0)    managed by Dr. Pollyann Savoy. sronegative. Dr. 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   Review of Systems:  Please see problem based charting under encounters tab for further details.    Physical Exam:  Vitals:   05/01/22 1104  BP: 107/71  Pulse: 88  Temp: 98.1 F (36.7 C)  TempSrc: Oral  SpO2: 99%  Weight: 141 lb 8 oz (64.2 kg)  Height:  (1.575 m)   Constitutional: Well-developed, well-nourished, and in no distress.  HENT:  Head: Normocephalic and atraumatic.  Eyes: EOM are normal.  Neck: Normal range of motion.  Cardiovascular: Normal rate, regular rhythm, intact distal pulses. No gallop and no friction rub.  No murmur heard. No lower extremity edema  Pulmonary: Non labored breathing on room air, no wheezing or rales  Abdominal: Soft. Normal bowel sounds. Non distended and non tender Musculoskeletal: Normal range of motion.        General: No tenderness or edema.  Neurological: Alert and oriented to person, place, and time. Non focal  Skin: Skin is warm and dry.    Assessment & Plan:   See Encounters Tab for problem based charting.  Patient discussed with Dr. Mayford Knife

## 2022-05-02 NOTE — Assessment & Plan Note (Signed)
Patient presents for follow up after he was recently started on levemir 25u. His blood sugars have mostly been >200. He checks them twice a day. Before breakfast and before dinner. He states he has not been using his levemir because when he placed the needle on the pen and attempted to prime the pen, he noticed that the pen would not dispense insulin.   Because his blood sugars remain uncontrolled and he needs further teaching with the use of his insulin pens he will present to back to clinic in 1 week.

## 2022-05-03 LAB — HM DIABETES EYE EXAM

## 2022-05-03 NOTE — Progress Notes (Signed)
Internal Medicine Clinic Attending  Case discussed with Dr. Carter  At the time of the visit.  We reviewed the resident's history and exam and pertinent patient test results.  I agree with the assessment, diagnosis, and plan of care documented in the resident's note.  

## 2022-05-04 ENCOUNTER — Other Ambulatory Visit (HOSPITAL_COMMUNITY): Payer: Self-pay

## 2022-05-04 MED ORDER — OFLOXACIN 0.3 % OP SOLN
1.0000 [drp] | Freq: Four times a day (QID) | OPHTHALMIC | 5 refills | Status: AC
  Filled 2022-05-04: qty 5, 25d supply, fill #0

## 2022-05-04 MED ORDER — PREDNISOLONE ACETATE 1 % OP SUSP
1.0000 [drp] | Freq: Four times a day (QID) | OPHTHALMIC | 5 refills | Status: AC
  Filled 2022-05-04: qty 5, 25d supply, fill #0

## 2022-05-10 ENCOUNTER — Encounter: Payer: Self-pay | Admitting: Dietician

## 2022-05-12 ENCOUNTER — Other Ambulatory Visit (HOSPITAL_COMMUNITY): Payer: Self-pay

## 2022-05-24 ENCOUNTER — Other Ambulatory Visit (HOSPITAL_COMMUNITY): Payer: Self-pay

## 2022-05-24 MED ORDER — OFLOXACIN 0.3 % OP SOLN
1.0000 [drp] | Freq: Four times a day (QID) | OPHTHALMIC | 5 refills | Status: AC
  Filled 2022-05-24: qty 5, 25d supply, fill #0

## 2022-05-24 MED ORDER — PREDNISOLONE ACETATE 1 % OP SUSP
1.0000 [drp] | Freq: Four times a day (QID) | OPHTHALMIC | 5 refills | Status: AC
  Filled 2022-05-24: qty 10, 50d supply, fill #0

## 2022-05-25 ENCOUNTER — Other Ambulatory Visit (HOSPITAL_COMMUNITY): Payer: Self-pay

## 2022-06-22 ENCOUNTER — Other Ambulatory Visit (HOSPITAL_COMMUNITY): Payer: Self-pay

## 2022-06-26 ENCOUNTER — Encounter: Payer: Self-pay | Admitting: Internal Medicine

## 2022-06-27 ENCOUNTER — Encounter: Payer: Self-pay | Admitting: Internal Medicine

## 2022-06-27 ENCOUNTER — Encounter: Payer: Self-pay | Admitting: Dietician

## 2022-06-28 ENCOUNTER — Encounter: Payer: Self-pay | Admitting: Dietician

## 2022-07-06 ENCOUNTER — Ambulatory Visit: Payer: Self-pay | Admitting: Internal Medicine

## 2022-07-06 ENCOUNTER — Other Ambulatory Visit: Payer: Self-pay

## 2022-07-06 ENCOUNTER — Telehealth: Payer: Self-pay | Admitting: Dietician

## 2022-07-06 ENCOUNTER — Ambulatory Visit (INDEPENDENT_AMBULATORY_CARE_PROVIDER_SITE_OTHER): Payer: Self-pay | Admitting: Dietician

## 2022-07-06 ENCOUNTER — Encounter: Payer: Self-pay | Admitting: Dietician

## 2022-07-06 ENCOUNTER — Encounter: Payer: Self-pay | Admitting: Internal Medicine

## 2022-07-06 ENCOUNTER — Other Ambulatory Visit (HOSPITAL_COMMUNITY): Payer: Self-pay

## 2022-07-06 VITALS — BP 94/68 | HR 94 | Temp 98.4°F | Ht 62.0 in | Wt 142.0 lb

## 2022-07-06 DIAGNOSIS — E1165 Type 2 diabetes mellitus with hyperglycemia: Secondary | ICD-10-CM

## 2022-07-06 DIAGNOSIS — Z794 Long term (current) use of insulin: Secondary | ICD-10-CM

## 2022-07-06 DIAGNOSIS — E1140 Type 2 diabetes mellitus with diabetic neuropathy, unspecified: Secondary | ICD-10-CM

## 2022-07-06 LAB — GLUCOSE, CAPILLARY: Glucose-Capillary: 290 mg/dL — ABNORMAL HIGH (ref 70–99)

## 2022-07-06 LAB — POCT GLYCOSYLATED HEMOGLOBIN (HGB A1C): Hemoglobin A1C: 11 % — AB (ref 4.0–5.6)

## 2022-07-06 MED ORDER — LIRAGLUTIDE 18 MG/3ML ~~LOC~~ SOPN
0.6000 mg | PEN_INJECTOR | Freq: Every day | SUBCUTANEOUS | 2 refills | Status: DC
  Filled 2022-07-06: qty 6, 60d supply, fill #0

## 2022-07-06 MED ORDER — FREESTYLE LIBRE 2 SENSOR MISC: 11 refills | Status: AC

## 2022-07-06 MED ORDER — FREESTYLE LIBRE 2 READER DEVI: 1 refills | Status: AC

## 2022-07-06 MED ORDER — PREGABALIN 75 MG PO CAPS
75.0000 mg | ORAL_CAPSULE | Freq: Three times a day (TID) | ORAL | 2 refills | Status: DC
  Filled 2022-07-06 (×2): qty 90, 30d supply, fill #0

## 2022-07-06 MED ORDER — NOVOLOG FLEXPEN 100 UNIT/ML ~~LOC~~ SOPN
15.0000 [IU] | PEN_INJECTOR | Freq: Three times a day (TID) | SUBCUTANEOUS | 2 refills | Status: DC
  Filled 2022-07-06: qty 15, 34d supply, fill #0

## 2022-07-06 MED ORDER — INSULIN DETEMIR 100 UNIT/ML ~~LOC~~ SOLN
25.0000 [IU] | Freq: Every day | SUBCUTANEOUS | 3 refills | Status: DC
  Filled 2022-07-06: qty 10, 40d supply, fill #0

## 2022-07-06 MED ORDER — UNIFINE PENTIPS 32G X 4 MM MISC
2 refills | Status: DC
  Filled 2022-07-06: qty 100, 30d supply, fill #0

## 2022-07-06 MED ORDER — LEVEMIR FLEXPEN 100 UNIT/ML ~~LOC~~ SOPN
25.0000 [IU] | PEN_INJECTOR | Freq: Every day | SUBCUTANEOUS | 11 refills | Status: DC
  Filled 2022-07-06: qty 6, 24d supply, fill #0

## 2022-07-06 NOTE — Progress Notes (Signed)
Diabetes Self-Management Education  Visit Type: Follow-up  Appt. Start Time: 1325 Appt. End Time: 1415  07/06/2022  [name], identified by name and date of birth, is a 46 y.o. male with a diagnosis of Diabetes: Type 2.   ASSESSMENT   We used an interpreter for this visit. It was the same one that Dr. August Saucer used.  Estimated body mass index is 25.97 kg/m as calculated from the following:   Height as of an earlier encounter on 07/06/22: 5\' 2"  (1.575 m).   Weight as of an earlier encounter on 07/06/22: 142 lb (64.4 kg).  His weight is appropriate and stable.  His blood sugars are uncontrolled. He has a difficult time self monitoring due to limited vision. Daughter is not always with him to help.   Lab Results  Component Value Date   HGBA1C 11.0 (A) 07/06/2022   HGBA1C 10.6 (A) 03/09/2022   HGBA1C 13.7 (A) 11/25/2021   HGBA1C 11.0 (A) 05/24/2021   HGBA1C 11.3 (A) 04/26/2020     Diabetes Self-Management Education - 07/06/22 1500       Visit Information   Visit Type Follow-up      Initial Visit   Diabetes Type Type 2    Date Diagnosed 2009    Are you currently following a meal plan? No   says his body limits how much he can eat (by making him feel sick) so his weight stays the same.   Are you taking your medications as prescribed? No   was not taking levemir or victoza     Psychosocial Assessment   Patient Belief/Attitude about Diabetes Afraid   he was extremely anxious at first during our visit   What is the hardest part about your diabetes right now, causing you the most concern, or is the most worrisome to you about your diabetes?   Checking blood sugar   he cannot see to check his blood sugar   Self-care barriers Impaired vision;English as a second language;Lack of material resources    Self-management support Family;Doctor's office;CDE visits    Other persons present Family Member   daughter   Patient Concerns Glycemic Control;Problem  Solving;Monitoring;Medication    Special Needs Verbal instruction   use an interpreter   Preferred Learning Style Auditory    Learning Readiness Contemplating    How often do you need to have someone help you when you read instructions, pamphlets, or other written materials from your doctor or pharmacy? 5 - Always    What is the last grade level you completed in school? 9      Pre-Education Assessment   Patient understands the diabetes disease and treatment process. N/A (Comment)    Patient understands incorporating nutritional management into lifestyle. --   will assess better at future visit   Patient undertands incorporating physical activity into lifestyle. Comprehends key points    Patient understands using medications safely. Needs Review    Patient understands monitoring blood glucose, interpreting and using results Needs Review    Patient understands prevention, detection, and treatment of acute complications. Needs Review    Patient understands prevention, detection, and treatment of chronic complications. Compreheands key points    Patient understands how to develop strategies to address psychosocial issues. Comprehends key points    Patient understands how to develop strategies to promote health/change behavior. Comprehends key points      Complications   Last HgB A1C per patient/outside source 11 %    How often do you check  your blood sugar? 3-4 times / week    Fasting Blood glucose range (mg/dL) >938    Postprandial Blood glucose range (mg/dL) >101    Number of hypoglycemic episodes per month 1    Can you tell when your blood sugar is low? Yes    What do you do if your blood sugar is low? drinks juice    Number of hyperglycemic episodes ( >200mg /dL): Daily    Can you tell when your blood sugar is high? No    Have you had a dilated eye exam in the past 12 months? Yes    Are you checking your feet? No      Dietary Intake   Breakfast coffee with milk 4 oz and splenda     Dinner we never got to this today    Beverage(s) water      Activity / Exercise   Activity / Exercise Type Light (walking / raking leaves);ADL's      Patient Education   Previous Diabetes Education Yes (please comment)    Medications Taught/reviewed insulin/injectables, injection, site rotation, insulin/injectables storage and needle disposal.;Reviewed patients medication for diabetes, action, purpose, timing of dose and side effects.    Monitoring Taught/evaluated CGM (comment);Taught/discussed recording of test results and interpretation of SMBG.;Purpose and frequency of SMBG.      Individualized Goals (developed by patient)   Medications take my medication as prescribed    Monitoring  Consistenly use CGM      Outcomes   Expected Outcomes Demonstrated interest in learning but significant barriers to change    Future DMSE 4-6 wks    Program Status Not Completed      Subsequent Visit   Since your last visit have you continued or begun to take your medications as prescribed? --   see above   Since your last visit have you had your blood pressure checked? No    Since your last visit have you experienced any weight changes? No change    Since your last visit, are you checking your blood glucose at least once a day? No             Individualized Plan for Diabetes Self-Management Training:   Learning Objective:  Patient will have a greater understanding of diabetes self-management. Patient education plan is to attend individual and/or group sessions per assessed needs and concerns.   Plan:   Patient Instructions  Estimado Sr. Ermalinda Barrios por su visita hoy!  El plan es reiniciar Levemir insulina de accin prolongada 25 unidades con el desayuno.  Contine tomando la insulina de accin rpida de Novolog antes de las [address] veces al da, [place] solo tomara 5 unidades con el desayuno si solo toma caf y no ms de 15 unidades con cualquiera de las comidas.  Controle  el nivel de azcar en la sangre al menos 6 veces al da con un monitor continuo de glucosa, si tiene [place]. De lo contrario, use su medidor al [employer] 3 veces al da: antes del desayuno y antes de la cena y antes de Engineer, water menos 2 horas despus de Physicist, medical.  Me gustara discutir ms sobre lo que come y bebe y su actividad en nuestra prxima visita para asegurarnos de que coincida con sus insulinas.  Como siempre, si tiene [place] pregunta o inquietud [no dude Lobbyist.  Lupita Leash [phone]    Dear Mr. Imperatore,   Thank you for your visit today!  Plan is to restart Levemir long acting  insulin 25 units with breakfast.  Continue taking Novolog rapid acting insulin before meals wo times a day  BUT would only take 5 units with breakfast if you only have coffee and no more than 15 units with either meal.   Check blood sugar at least 6 times a day with continuous glucose monitor if you get one. Other wise, using your meter at least 3 times a day- before breakfast and before dinner and before bedtime/at last 2 hours after dinner.   I would like to discuss more about what you eat and drink and your activity at our next visit to be sure it is matching your insulins.   As always, if you have ay questions or concerns [please do not hesitate to call.  Lupita Leash [phone]  Expected Outcomes:  Demonstrated interest in learning but significant barriers to change  Education material provided: Diabetes Resources  If problems or questions, patient to contact team via:  Phone and Email  Future DSME appointment: 4-6 wks Norm Parcel, RD 07/06/2022 4:30 PM.

## 2022-07-06 NOTE — Assessment & Plan Note (Signed)
Patient reports persistent diabetic neuropathy affecting both his hands and feet that does not improve or get worse based on the time of day. He has tried gabapentin in the past without significant relief.  Plan:Start pregabalin 75 mg TID.

## 2022-07-06 NOTE — Progress Notes (Signed)
   CC: diabetes follow-up  HPI:  [name] is a 46 y.o. person with past medical history as detailed below who presents today for diabetes follow-up. Please see problem based charting for detailed assessment and plan.  Past Medical History:  Diagnosis Date   Diabetes mellitus 03/2008   diagnosed in april when admitted to [facility] with blood glucose of 581. HbA1C was 9.9   DKA (diabetic ketoacidoses)    hospitalized in 03/10/08   Latent tuberculosis infection    PPD test measured 12 mm on 05/25/08. CXR on 06/03/08 showed no active disease or adenopathy. Started to take isoniazid 300 mg daily. on 06/11/08 and will  take medication  along with Vit  B6 for 9 months.   Rheumatoid arthritis(714.0)    managed by Dr. Pollyann [name]. sronegative. Dr. 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   Review of Systems:  Negative unless otherwise stated.  Physical Exam:  Vitals:   07/06/22 1456  BP: 94/68  Pulse: 94  Temp: 98.4 F (36.9 C)  TempSrc: Oral  SpO2: 99%  Weight: 142 lb (64.4 kg)  Height: 5\' 2"  (1.575 m)   Constitutional:No acute distress.  Cardio:Regular rate and rhythm. No murmurs, rubs, or gallops. Pulm:Clear to auscultation bilaterally. for extremity edema. DP pulses normal bilaterally. No abrasions or lesions to bilateral feet. Skin:Warm and dry. Neuro:Alert and oriented x3. No focal deficit noted. Psych:Pleasant mood and affect.  Assessment & Plan:   See Encounters Tab for problem based charting.  Uncontrolled type 2 diabetes mellitus with hyperglycemia (HCC) DM: Last HbA1c 10.6% in 02/2022, worse today at 11.0. Current prescribed regimen is novolog 15 units TID with meals, levemir 25 units daily, victoza 0.6 mg daily however he was under the impression that when his prior Levemir and Victoza ran out that he could not get more, therefore did not request or pick up refills. He quite infrequently checks  blood sugar at home because he has poor vision and struggles to see the blood from a finger stick, however when he does check he has had readings of above 200, sometimes as high as 300.   He apparently is only taking novolog 25 units in the morning and 15 units at night, not taking Levemir or Victoza.  Plan:Refills sent in for novolog, Levemir, and Victoza. Patient to return in 4 weeks and coordinate a visit this same day with 03/2022. A prescription for Freestyle Lupita Leash has been sent in as well. Patient counseled on requesting refills when his medications were running low so that he does not run out of medication again.  Ultimately, the goal will be to transition him to Metformin, discontinue mealtime insulin, and start an SGLT2/GLP1. We will take his diabetes management one visit at a time.  Diabetic neuropathy, painful Patient reports persistent diabetic neuropathy affecting both his hands and feet that does not improve or get worse based on the time of day. He has tried gabapentin in the past without significant relief.  Plan:Start pregabalin 75 mg TID.  Patient discussed with Dr.  Josephine [name]

## 2022-07-06 NOTE — Assessment & Plan Note (Signed)
Last HbA1c 10.6% in 02/2022, worse today at 11.0. Current prescribed regimen is novolog 15 units TID with meals, levemir 25 units daily, victoza 0.6 mg daily however he was under the impression that when his prior Levemir and Victoza ran out that he could not get more, therefore did not request or pick up refills. He quite infrequently checks blood sugar at home because he has poor vision and struggles to see the blood from a finger stick, however when he does check he has had readings of above 200, sometimes as high as 300.   He apparently is only taking novolog 25 units in the morning and 15 units at night, not taking Levemir or Victoza.  Plan:Refills sent in for novolog, Levemir, and Victoza. Patient to return in 4 weeks and coordinate a visit this same day with Lupita Leash. A prescription for Freestyle Josephine Igo has been sent in as well. Patient counseled on requesting refills when his medications were running low so that he does not run out of medication again.  Ultimately, the goal will be to transition him to Metformin, discontinue mealtime insulin, and start an SGLT2/GLP1. We will take his diabetes management one visit at a time.

## 2022-07-06 NOTE — Assessment & Plan Note (Addendum)
DM: Last HbA1c 10.6% in 02/2022, worse today at 11.0. Current prescribed regimen is novolog 15 units TID with meals, levemir 25 units daily, victoza 0.6 mg daily however he was under the impression that when his prior Levemir and Victoza ran out that he could not get more, therefore did not request or pick up refills. He quite infrequently checks blood sugar at home because he has poor vision and struggles to see the blood from a finger stick, however when he does check he has had readings of above 200, sometimes as high as 300.   He apparently is only taking novolog 25 units in the morning and 15 units at night, not taking Levemir or Victoza.  Plan:Refills sent in for novolog, Levemir, and Victoza. Patient to return in 4 weeks and coordinate a visit this same day with Lupita Leash. A prescription for Freestyle Josephine Igo has been sent in as well. Patient counseled on requesting refills when his medications were running low so that he does not run out of medication again.  Ultimately, the goal will be to transition him to Metformin, discontinue mealtime insulin, and start an SGLT2/GLP1. We will take his diabetes management one visit at a time.

## 2022-07-06 NOTE — Addendum Note (Signed)
Addended by: Ihor Dow on: 07/06/2022 04:33 PM   Modules accepted: Orders

## 2022-07-06 NOTE — Patient Instructions (Addendum)
Sr. Veryl Speak,  Fue agradable verte hoy! Karl Pock por elegir [facility] para su Atencin Primaria.  Hoy hablamos de:  Diabetes: asegrese de comer al menos 3 comidas al da y de tomar 15 unidades de novolog con cada comida. Tambin debe asegurarse de tomar levemir 25 unidades una vez al da por la maana y comenzar a tomar victoza 0,6 mg Pollyann Savoy al [employer]. He enviado recargas para todos sus medicamentos. Cuando se est agotando, llame a la farmacia y solicite una Technical sales engineer.  Estoy enviando un medicamento llamado pregabalina que puede tomar tres veces al da para el dolor de pie. Tambin he enviado una receta para el monitor de glucosa porttil [employer].  Me gustara que volvieras en 4 semanas para que podamos ver cmo est tu nivel de azcar.  Mi mejor, Dr. August Saucer    Mr. Duerst,  It was nice seeing you today! Thank you for choosing [facility] for your Primary Care.    Today we talked about:   Diabetes: Please make sure you are eating at least 3 meals per day and taking 15 units of novolog with each meal. You also need to make sure to take levemir 25 units once daily in the morning, and start taking victoza 0.6 mg once daily. I have sent in refills for all of your medications. When you are running low, call the pharmacy and request a refill.  I am sending in a medicine called pregabalin that you can take three times daily for your foot pain. I have also sent a prescription for the [facility] wearable glucose monitor.   I would like you to come back in 4 weeks so we can see how your sugar is doing.   My best,  Dr. August Saucer

## 2022-07-06 NOTE — Patient Instructions (Addendum)
Estimado [name] por su visita hoy!  El plan es reiniciar Levemir insulina de accin prolongada 25 unidades con el desayuno.  Contine tomando la insulina de accin rpida de Novolog antes de las [address] veces al da, [place] solo tomara 5 unidades con el desayuno si solo toma caf y no ms de 15 unidades con cualquiera de las comidas.  Controle el nivel de azcar en la sangre al menos 6 veces al da con un monitor continuo de glucosa, si tiene [place]. De lo contrario, use su medidor al [employer] 3 veces al da: antes del desayuno y antes de la cena y antes de Engineer, water menos 2 horas despus de Physicist, medical.  Me gustara discutir ms sobre lo que come y bebe y su actividad en nuestra prxima visita para asegurarnos de que coincida con sus insulinas.  Como siempre, si tiene [place] pregunta o inquietud [no dude Lobbyist.  Lupita Leash [phone]    Dear [name],   Thank you for your visit today!  Plan is to restart Levemir long acting insulin 25 units with breakfast.  Continue taking Novolog rapid acting insulin before meals wo times a day  BUT would only take 5 units with breakfast if you only have coffee and no more than 15 units with either meal.   Check blood sugar at least 6 times a day with continuous glucose monitor if you get one. Other wise, using your meter at least 3 times a day- before breakfast and before dinner and before bedtime/at last 2 hours after dinner.   I would like to discuss more about what you eat and drink and your activity at our next visit to be sure it is matching your insulins.   As always, if you have ay questions or concerns [please do not hesitate to call.  Lupita Leash [phone]

## 2022-07-06 NOTE — Telephone Encounter (Addendum)
Call to give [facility] the [facility] pharmacy savings card: Authorization number [id number] Johnney Killian [id number] Grp [id number] Pcn [id number]  Cost after coupon:64.99. patient and daughter informed.      ;

## 2022-07-07 ENCOUNTER — Other Ambulatory Visit (HOSPITAL_COMMUNITY): Payer: Self-pay

## 2022-07-07 NOTE — Progress Notes (Signed)
Internal Medicine Clinic Attending  Case discussed with Dr. August Saucer  At the time of the visit.  We reviewed the resident's history and exam and pertinent patient test results.  I agree with the assessment, diagnosis, and plan of care documented in the resident's note.    Adherence has been very challenging, patient did not understand he was supposed to get his refills when he ran out of insulin, so he hasn't been taking his prescribed regimen. I agree with the plan to restart intended regimen today, get him a Freestyle Libre for CGM, and follow up with [place] & Lupita Leash shortly.

## 2022-07-12 ENCOUNTER — Other Ambulatory Visit (HOSPITAL_COMMUNITY): Payer: Self-pay

## 2022-08-07 ENCOUNTER — Other Ambulatory Visit: Payer: Self-pay

## 2022-08-07 ENCOUNTER — Other Ambulatory Visit (HOSPITAL_COMMUNITY): Payer: Self-pay

## 2022-08-07 ENCOUNTER — Ambulatory Visit: Payer: Self-pay

## 2022-08-07 ENCOUNTER — Ambulatory Visit (INDEPENDENT_AMBULATORY_CARE_PROVIDER_SITE_OTHER): Payer: Self-pay | Admitting: Dietician

## 2022-08-07 ENCOUNTER — Encounter: Payer: Self-pay | Admitting: Dietician

## 2022-08-07 DIAGNOSIS — E1140 Type 2 diabetes mellitus with diabetic neuropathy, unspecified: Secondary | ICD-10-CM

## 2022-08-07 DIAGNOSIS — Z794 Long term (current) use of insulin: Secondary | ICD-10-CM

## 2022-08-07 MED ORDER — NOVOLOG FLEXPEN 100 UNIT/ML ~~LOC~~ SOPN
10.0000 [IU] | PEN_INJECTOR | Freq: Two times a day (BID) | SUBCUTANEOUS | 2 refills | Status: DC
  Filled 2022-08-07: qty 6, 30d supply, fill #0
  Filled 2022-08-30: qty 6, 30d supply, fill #1
  Filled 2022-10-25: qty 6, 30d supply, fill #2
  Filled 2022-11-29: qty 6, 30d supply, fill #3
  Filled 2022-12-27: qty 6, 30d supply, fill #4
  Filled 2023-01-29: qty 6, 30d supply, fill #5

## 2022-08-07 MED ORDER — LIRAGLUTIDE 18 MG/3ML ~~LOC~~ SOPN
0.6000 mg | PEN_INJECTOR | Freq: Every day | SUBCUTANEOUS | 2 refills | Status: DC
  Filled 2022-08-07: qty 6, 60d supply, fill #0

## 2022-08-07 MED ORDER — LEVEMIR FLEXPEN 100 UNIT/ML ~~LOC~~ SOPN
25.0000 [IU] | PEN_INJECTOR | Freq: Every day | SUBCUTANEOUS | 11 refills | Status: DC
  Filled 2022-08-07: qty 6, 24d supply, fill #0
  Filled 2022-08-30: qty 6, 24d supply, fill #1
  Filled 2022-10-25: qty 6, 24d supply, fill #2
  Filled 2022-11-29: qty 6, 24d supply, fill #3

## 2022-08-07 NOTE — Assessment & Plan Note (Signed)
Patient presents for follow-up of his type 2 diabetes today.  Last A1c 11% 1 month ago.  He has not been taking his blood sugar regularly, but reports a few readings in the mid 200s.  Currently prescribed NovoLog 15 units 3 times a day with meals, Levemir 25 units in the morning, Victoza 0.6 mg daily.  However, patient has been taking NovoLog 15 units with breakfast and dinner only.  He endorses some episodes of hypoglycemia where his vision worsens, he is dizzy, sweaty, and nervous.  This has happened 2-3 times since his last visit.  He drinks juice or soda to address his symptoms.  These episodes do not happen with any regularity so it is hard to know which component of his insulin regimen needs to be changed.  He also reports that when he goes for his refills, he is unable to obtain medications and thus tries to spread out his insulin use so that he does not run out.  -Refill novolog (10u BID), Levimir 25u daily, Victoza 0.6 mg daily -Return in 1 week to review blood sugar checks in morning and with meals -Activate CGM with Butch Penny at next visit.  He was instructed to bring this with him. -Instructed to have visualized meals before taking mealtime insulin

## 2022-08-07 NOTE — Progress Notes (Signed)
Diabetes Self-Management Education  Visit Type:  Follow-up  Appt. Start Time: 1030 Appt. End Time: 1517  08/07/2022  [name], identified by name and date of birth, is a 46 y.o. male with a diagnosis of Diabetes:    Type 2 ASSESSMENT   [name] is here with his friend and an interpreter today. He did not get the CGM and his friend doe snot seem to know very much about it but is willing to help him with it. Education was done again today on how to apply sensor and start it and how to use the CGM to self monitoring blood sugars. They were educated again where to get CGM supplies, how to call for the discount if sensor cost more than 75$. They verbalized understanding.    His weight gain along with his report that he is taking the 15 units Novolog before meals three times daily along with levemir and victoza is consistent wth him saying his blood sugars have improved. He reports when he has checked his blood sugars are in the 200s instead of the 300s.   Estimated body mass index is 26.78 kg/m as calculated from the following:   Height as of an earlier encounter on 08/07/22: 5\' 2"  (1.575 m).   Weight as of an earlier encounter on 08/07/22: 146 lb 6.4 oz (66.4 kg). Wt Readings from Last 10 Encounters:  08/07/22 146 lb 6.4 oz (66.4 kg)  07/06/22 142 lb (64.4 kg)  05/01/22 141 lb 8 oz (64.2 kg)  04/19/22 142 lb 4.8 oz (64.5 kg)  03/09/22 142 lb 1.6 oz (64.5 kg)  12/26/21 144 lb 12.8 oz (65.7 kg)  11/25/21 133 lb 6.4 oz (60.5 kg)  06/27/21 141 lb 14.4 oz (64.4 kg)  05/24/21 142 lb 9.6 oz (64.7 kg)  04/26/20 135 lb 6.4 oz (61.4 kg)       Diabetes Self-Management Education - 08/07/22 1300       Health Coping   How would you rate your overall health? Good      Pre-Education Assessment   Patient understands incorporating nutritional management into lifestyle. Needs Review      Complications   Have you had a dental exam in the past 12 months? --   will assess at future  visit     Patient Education   Healthy Eating Plate Method    Monitoring Taught/evaluated CGM (comment)      Patient Self-Evaluation of Goals - Patient rates self as meeting previously set goals (% of time)   Medications >75% (most of the time)    Monitoring < 25% (hardly ever/never)      Outcomes   Program Status Not Completed      Subsequent Visit   Since your last visit have you continued or begun to take your medications as prescribed? Yes    Since your last visit have you had your blood pressure checked? No    Since your last visit have you experienced any weight changes? Gain    Weight Gain (lbs) 4    Since your last visit, are you checking your blood glucose at least once a day? No             Learning Objective:  Patient will have a greater understanding of diabetes self-management. Patient education plan is to attend individual and/or group sessions per assessed needs and concerns.   Plan:   Patient Instructions  este es el vdeo que muestra cmo aplicar, iniciar y [place] monitor  continuo de glucosa [employer] 2.  https://www.google.com/url?sa=t&rct=j&q=&esrc=s&source=web&cd=&cad=rja&uact=8&ved=[email or web address]&url=https%3A%16F%16Fwww.youtube.com%16Fwatch%3Fv%3DYWb5QqlBwDk&usg=[email or web address]&[email or web address]=[id number]   Lupita Leash [phone]    Expected Outcomes:  Demonstrated interest in learning. Expect positive outcomes  Education material provided: My Plate and Diabetes Resources  If problems or questions, patient to contact team via:  Phone  Future DSME appointment: - 2 wks

## 2022-08-07 NOTE — Patient Instructions (Signed)
este es el vdeo que muestra cmo Midwife, iniciar y Marydel monitor continuo de glucosa [place] Libre 2.  https://www.google.com/url?sa=t&rct=j&q=&esrc=s&source=web&cd=&cad=rja&uact=8&ved=[email or web address]BegQICBAG&url=https%3A%63F%63Fwww.youtube.com%63Fwatch%3Fv%3DYWb5QqlBwDk&usg=[email or web address]&[email or web address]=[id number]   Butch Penny [phone]

## 2022-08-07 NOTE — Patient Instructions (Signed)
Jason [name], fue un placer verlo hoy!  Hoy discutimos: Diabetes: Tome Levimir 25 mg por la [employer] Tomar Novolog 10 u con el desayuno y la cena. No tome su Novolog hasta que haya visto lo que va a comer. Le pediremos que programe un tiempo con [name] para configurar su Freestyle Libre; asegrese de traerlo con usted. Nos vemos de regreso en 1 semana. Mida su nivel de azcar en sangre todas las [place] y antes de cada comida. [name] su medidor para que podamos ajustar los medicamentos si es necesario.  He ordenado los siguientes laboratorios hoy:  rdenes de laboratorio No se han solicitado pruebas de laboratorio hoy    Pruebas ordenadas hoy:  ninguno  Referencias ordenadas hoy:  rdenes de referencia No se solicitaron referencias hoy    He ordenado/cambiado los siguientes medicamentos:  Suspenda los siguientes medicamentos: No hay medicamentos discontinuados.  Inicie los siguientes medicamentos: En este encuentro no se realizaron rdenes de los tipos definidos.    Seguimiento: 1 semana  Asegrese de llegar 15 minutos antes de su prxima cita. Si llega tarde, es posible que le soliciten reprogramar su cita.  Esperamos verte la prxima vez. Llame a nuestra clnica al [phone] si tiene alguna pregunta o inquietud. El mejor momento para llamar es de lunes a viernes de 9 a. m. a 4 p. m., pero hay alguien disponible las 24 horas, los 7 das de la [name]. Si es fuera del horario de atencin o durante el fin de [name], llame al nmero principal del hospital y pregunte por el residente de guardia de medicina interna. Si necesita reabastecimiento de medicamentos, notifique a su farmacia con una semana de anticipacin y ellos nos enviarn una solicitud.  Gracias por permitirnos participar en su cuidado. Deseandote lo mejor!  [name], MD

## 2022-08-07 NOTE — Progress Notes (Signed)
   CC: diabetes f/u  HPI:  [name] is a 46 y.o. with past medical history as below who presents for diabetes f/u.  Past Medical History:  Diagnosis Date   Diabetes mellitus 03/2008   diagnosed in april when admitted to [facility] with blood glucose of 581. HbA1C was 9.9   DKA (diabetic ketoacidoses)    hospitalized in 03/10/08   Latent tuberculosis infection    PPD test measured 12 mm on 05/25/08. CXR on 06/03/08 showed no active disease or adenopathy. Started to take isoniazid 300 mg daily. on 06/11/08 and will  take medication  along with Vit  B6 for 9 months.   Rheumatoid arthritis(714.0)    managed by Dr. Bo [name]. sronegative. Dr. Estanislado [name] wanted to start him on Methotrxate but patient was found to be  PPD positive.   Review of Systems:  See detailed assessment and plan for pertinent ROS.  Physical Exam:  Vitals:   08/07/22 0937  BP: 123/82  Pulse: 95  Temp: 98 F (36.7 C)  TempSrc: Oral  SpO2: 100%  Weight: 146 lb 6.4 oz (66.4 kg)  Height: 5\' 2"  (1.575 m)   Physical Exam Constitutional:      General: He is not in acute distress. HENT:     Head: Normocephalic and atraumatic.  Eyes:     Extraocular Movements: Extraocular movements intact.  Cardiovascular:     Rate and Rhythm: Normal rate and regular rhythm.  Pulmonary:     Effort: Pulmonary effort is normal.     Breath sounds: Normal breath sounds. No wheezing, rhonchi or rales.  Skin:    General: Skin is warm and dry.  Neurological:     Mental Status: He is alert and oriented to person, place, and time.  Psychiatric:        Mood and Affect: Mood normal.      Assessment & Plan:   See Encounters Tab for problem based charting. Type 2 diabetes mellitus with diabetic neuropathy, with long-term current use of insulin [facility]) Patient presents for follow-up of his type 2 diabetes today.  Last A1c 11% 1 month ago.  He has not been taking his blood sugar regularly, but reports a few readings in  the mid 200s.  Currently prescribed NovoLog 15 units 3 times a day with meals, Levemir 25 units in the morning, Victoza 0.6 mg daily.  However, patient has been taking NovoLog 15 units with breakfast and dinner only.  He endorses some episodes of hypoglycemia where his vision worsens, he is dizzy, sweaty, and nervous.  This has happened 2-3 times since his last visit.  He drinks juice or soda to address his symptoms.  These episodes do not happen with any regularity so it is hard to know which component of his insulin regimen needs to be changed.  He also reports that when he goes for his refills, he is unable to obtain medications and thus tries to spread out his insulin use so that he does not run out.  -Refill novolog (10u BID), Levimir 25u daily, Victoza 0.6 mg daily -Return in 1 week to review blood sugar checks in morning and with meals -Activate CGM with Jason [name] at next visit.  He was instructed to bring this with him. -Instructed to have visualized meals before taking mealtime insulin   Patient seen with Dr. Dareen [name]

## 2022-08-09 ENCOUNTER — Ambulatory Visit: Payer: Self-pay | Admitting: Dietician

## 2022-08-09 NOTE — Progress Notes (Signed)
Internal Medicine Clinic Attending   I saw and evaluated the patient.  I personally confirmed the key portions of the history and exam documented by Dr. White and I reviewed pertinent patient test results.  The assessment, diagnosis, and plan were formulated together and I agree with the documentation in the resident's note.  

## 2022-08-14 ENCOUNTER — Encounter: Payer: Self-pay | Admitting: Student

## 2022-08-14 ENCOUNTER — Encounter: Payer: Self-pay | Admitting: Internal Medicine

## 2022-08-30 ENCOUNTER — Other Ambulatory Visit (HOSPITAL_COMMUNITY): Payer: Self-pay

## 2022-08-30 MED ORDER — PREDNISOLONE ACETATE 1 % OP SUSP
1.0000 [drp] | Freq: Four times a day (QID) | OPHTHALMIC | 5 refills | Status: AC
  Filled 2022-08-30: qty 5, 25d supply, fill #0

## 2022-08-30 MED ORDER — MOXIFLOXACIN HCL 0.5 % OP SOLN
1.0000 [drp] | Freq: Four times a day (QID) | OPHTHALMIC | 5 refills | Status: AC
  Filled 2022-08-30: qty 3, 15d supply, fill #0

## 2022-10-25 ENCOUNTER — Other Ambulatory Visit (HOSPITAL_COMMUNITY): Payer: Self-pay

## 2022-11-28 ENCOUNTER — Other Ambulatory Visit: Payer: Self-pay

## 2022-11-28 ENCOUNTER — Ambulatory Visit (HOSPITAL_COMMUNITY)
Admission: RE | Admit: 2022-11-28 | Discharge: 2022-11-28 | Disposition: A | Discharge status: Home / Self Care | Payer: Self-pay | Source: Ambulatory Visit | Attending: Internal Medicine | Admitting: Internal Medicine

## 2022-11-28 ENCOUNTER — Emergency Department (HOSPITAL_COMMUNITY)
Admission: EM | Admit: 2022-11-28 | Discharge: 2022-11-28 | Disposition: A | Discharge status: Home / Self Care | Payer: Self-pay | Attending: Emergency Medicine | Admitting: Emergency Medicine

## 2022-11-28 ENCOUNTER — Ambulatory Visit: Payer: Self-pay | Admitting: Student

## 2022-11-28 ENCOUNTER — Other Ambulatory Visit (HOSPITAL_COMMUNITY): Payer: Self-pay

## 2022-11-28 ENCOUNTER — Emergency Department (HOSPITAL_COMMUNITY): Payer: Self-pay

## 2022-11-28 ENCOUNTER — Encounter (HOSPITAL_COMMUNITY): Payer: Self-pay

## 2022-11-28 VITALS — BP 110/66 | HR 91 | Temp 97.5°F | Ht 63.0 in | Wt 140.1 lb

## 2022-11-28 DIAGNOSIS — R079 Chest pain, unspecified: Secondary | ICD-10-CM

## 2022-11-28 DIAGNOSIS — Z794 Long term (current) use of insulin: Secondary | ICD-10-CM | POA: Insufficient documentation

## 2022-11-28 DIAGNOSIS — E1165 Type 2 diabetes mellitus with hyperglycemia: Secondary | ICD-10-CM | POA: Insufficient documentation

## 2022-11-28 DIAGNOSIS — E1140 Type 2 diabetes mellitus with diabetic neuropathy, unspecified: Secondary | ICD-10-CM

## 2022-11-28 DIAGNOSIS — K5900 Constipation, unspecified: Secondary | ICD-10-CM | POA: Insufficient documentation

## 2022-11-28 DIAGNOSIS — R739 Hyperglycemia, unspecified: Secondary | ICD-10-CM

## 2022-11-28 DIAGNOSIS — R0789 Other chest pain: Secondary | ICD-10-CM | POA: Insufficient documentation

## 2022-11-28 DIAGNOSIS — I200 Unstable angina: Secondary | ICD-10-CM

## 2022-11-28 LAB — CBC
HCT: 41.5 % (ref 39.0–52.0)
Hemoglobin: 14.4 g/dL (ref 13.0–17.0)
MCH: 29.8 pg (ref 26.0–34.0)
MCHC: 34.7 g/dL (ref 30.0–36.0)
MCV: 85.7 fL (ref 80.0–100.0)
Platelets: 253 10*3/uL (ref 150–400)
RBC: 4.84 MIL/uL (ref 4.22–5.81)
RDW: 11.9 % (ref 11.5–15.5)
WBC: 7.1 10*3/uL (ref 4.0–10.5)
nRBC: 0 % (ref 0.0–0.2)

## 2022-11-28 LAB — BASIC METABOLIC PANEL
Anion gap: 11 (ref 5–15)
BUN: 16 mg/dL (ref 6–20)
CO2: 24 mmol/L (ref 22–32)
Calcium: 9.6 mg/dL (ref 8.9–10.3)
Chloride: 100 mmol/L (ref 98–111)
Creatinine, Ser: 0.62 mg/dL (ref 0.61–1.24)
GFR, Estimated: >60 mL/min (ref >60)
Glucose, Bld: 409 mg/dL — ABNORMAL HIGH (ref 70–99)
Potassium: 4 mmol/L (ref 3.5–5.1)
Sodium: 135 mmol/L (ref 135–145)

## 2022-11-28 LAB — POCT GLYCOSYLATED HEMOGLOBIN (HGB A1C): Hemoglobin A1C: 13.3 % — AB (ref 4.0–5.6)

## 2022-11-28 LAB — GLUCOSE, CAPILLARY: Glucose-Capillary: 438 mg/dL — ABNORMAL HIGH (ref 70–99)

## 2022-11-28 LAB — TROPONIN I (HIGH SENSITIVITY)
Troponin I (High Sensitivity): 4 ng/L (ref <18)
Troponin I (High Sensitivity): 3 ng/L (ref <18)

## 2022-11-28 LAB — CBG MONITORING, ED: Glucose-Capillary: 144 mg/dL — ABNORMAL HIGH (ref 70–99)

## 2022-11-28 MED ORDER — IOHEXOL 350 MG/ML SOLN
75.0000 mL | Freq: Once | INTRAVENOUS | Status: AC | PRN
  Administered 2022-11-28: 75 mL via INTRAVENOUS

## 2022-11-28 MED ORDER — SODIUM CHLORIDE 0.9 % IV BOLUS
1000.0000 mL | Freq: Once | INTRAVENOUS | Status: AC
  Administered 2022-11-28: 1000 mL via INTRAVENOUS

## 2022-11-28 MED ORDER — LIRAGLUTIDE 18 MG/3ML ~~LOC~~ SOPN
0.6000 mg | PEN_INJECTOR | Freq: Every day | SUBCUTANEOUS | 2 refills | Status: DC
  Filled 2022-11-28: qty 6, 60d supply, fill #0

## 2022-11-28 NOTE — ED Provider Triage Note (Signed)
Emergency Medicine Provider Triage Evaluation Note  Jason [name] , a 47 y.o. male  was evaluated in triage.  Pt complains of chest pain stabbing which began approximately 3 days ago, radiating down his left arm.  Evaluated by internal medicine today and sent here for further chest pain workup.  No alleviating factors, no exacerbating factors.  He does endorse a cough but no fevers.No prior hx of CAD.   Review of Systems  Positive: Chest pain, cough Negative: Leg swelling  Physical Exam  BP (!) 126/91 (BP Location: Right Arm)   Pulse 93   Temp 98.7 F (37.1 C)   Resp 16   Ht 5\' 3"  (1.6 m)   Wt 63.5 kg   SpO2 97%   BMI 24.80 kg/m  Gen:   Awake, no distress   Resp:  Normal effort  MSK:   Moves extremities without difficulty  Other:    Medical Decision Making  Medically screening exam initiated at 11:29 AM.  Appropriate orders placed.  Jason [name] was informed that the remainder of the evaluation will be completed by another provider, this initial triage assessment does not replace that evaluation, and the importance of remaining in the ED until their evaluation is complete.     Janeece Fitting, PA-C 11/28/22 1134

## 2022-11-28 NOTE — ED Provider Notes (Signed)
[facility] EMERGENCY DEPARTMENT Provider Note   CSN: [id number] Arrival date & time: 11/28/22  1112     History  Chief Complaint  Patient presents with   Chest Pain    Jason [name] is a 47 y.o. male with history significant for uncontrolled insulin-dependent DM type 2 presents to the ED complaining of left sided "pricking" chest pain that radiates into his left neck and down his left arm for the past 3 days.  Patient states he is not currently having this pain.  He reports he has had similar chest pain in the past that was approximately 7 months ago.  When asked about his blood sugars at home, patient states that despite using insulin he does not regularly check his blood sugar.  He also reports some mild left-sided abdominal pain.  Denies nausea, vomiting, shortness of breath, syncope, weakness, fever.  Patient was at his PCP earlier today and was sent to ED for evaluation.         Home Medications Prior to Admission medications   Medication Sig Start Date End Date Taking? Authorizing Provider  Alcohol Swabs (ALCOHOL WIPES) 70 % PADS Please use an alcohol wipe to cleanse your skin before each insulin injection 05/01/22   Rick Duff, MD  Blood Glucose Monitoring Suppl (AGAMATRIX PRESTO PRO METER) DEVI 1 Units by Does not apply route 3 (three) times daily before meals. Patient not taking: Reported on 12/01/2021 02/26/15   Lucious Groves, DO  Continuous Blood Gluc Receiver (FREESTYLE LIBRE 2 READER) DEVI Use to continuously monitor blood sugars 07/06/22   Farrel Gordon, DO  Continuous Blood Gluc Sensor (FREESTYLE LIBRE 2 SENSOR) MISC Use to continuously monitor blood sugars 07/06/22   Farrel Gordon, DO  glucose blood test strip Use to test blood sugar three times daily 05/01/22   Rick Duff, MD  insulin aspart (NOVOLOG FLEXPEN) 100 UNIT/ML FlexPen Inject 10 Units into the skin 2 (two) times daily with a meal. 08/07/22   Linward Natal, MD  insulin detemir  (LEVEMIR FLEXPEN) 100 UNIT/ML FlexPen Inject 25 Units into the skin daily. 08/07/22   Linward Natal, MD  Insulin Pen Needle (UNIFINE PENTIPS) 32G X 4 MM MISC Use as directed with insulin three times daily. 07/06/22   Farrel Gordon, DO  Insulin Syringe-Needle U-100 Flossie Buffy INSULIN SYRINGE) 31G X 1/4" 1 ML MISC Use twice daily for injecting insulin Patient not taking: Reported on 12/01/2021 05/24/21   Riesa Pope, MD  Insulin Syringes, Disposable, U-100 1 ML MISC Use to inject insulin twice daily. IM program. Patient not taking: Reported on 12/01/2021 05/24/21   Riesa Pope, MD  liraglutide (VICTOZA) 18 MG/3ML SOPN Inject 0.6 mg into the skin daily. 11/28/22   Riesa Pope, MD  Microlet Lancets MISC Use 3 times daily before meals Patient not taking: Reported on 12/01/2021 11/25/21   Rick Duff, MD  moxifloxacin (VIGAMOX) 0.5 % ophthalmic solution Place 1 drop into the left eye 4 (four) times daily. Begin one day after surgery, continue as directed. 08/30/22     ofloxacin (OCUFLOX) 0.3 % ophthalmic solution Place 1 drop into the right eye 4 (four) times daily. Begin one day prior to surgery, Continue as directed. 05/04/22     ofloxacin (OCUFLOX) 0.3 % ophthalmic solution Place 1 drop into the right eye 4 (four) times daily. Begin one day prior to surgery. Continue as directed 05/24/22     prednisoLONE acetate (PRED FORTE) 1 % ophthalmic suspension Place 1 drop into the right eye 4 (  four) times daily. Begin one day after surgery, continue as directed 05/04/22     prednisoLONE acetate (PRED FORTE) 1 % ophthalmic suspension Place 1 drop into the right eye 4 (four) times daily. Begin one day after surgery, continue as directed 05/24/22     prednisoLONE acetate (PRED FORTE) 1 % ophthalmic suspension Place 1 drop into the left eye 4 (four) times daily. Begin one day after surgery, continue as directed. 08/30/22     pregabalin (LYRICA) 75 MG capsule Take 1 capsule (75 mg total) by mouth 3  (three) times daily. 07/06/22 07/06/23  Champ Mungo, DO      Allergies    Patient has no known allergies.    Review of Systems   Review of Systems  Constitutional:  Negative for fever.  Respiratory:  Negative for chest tightness and shortness of breath.   Cardiovascular:  Positive for chest pain. Negative for palpitations.  Gastrointestinal:  Positive for abdominal pain. Negative for diarrhea, nausea and vomiting.  Neurological:  Negative for dizziness, syncope and weakness.    Physical Exam Updated Vital Signs BP (!) 141/95   Pulse 88   Temp 98.1 F (36.7 C) (Oral)   Resp 16   Ht 5\' 3"  (1.6 m)   Wt 63.5 kg   SpO2 100%   BMI 24.80 kg/m  Physical Exam Vitals and nursing note reviewed.  Constitutional:      General: He is not in acute distress.    Appearance: He is not ill-appearing.  HENT:     Mouth/Throat:     Mouth: Mucous membranes are moist.     Pharynx: Oropharynx is clear.  Cardiovascular:     Rate and Rhythm: Normal rate and regular rhythm.     Pulses: Normal pulses.     Heart sounds: Normal heart sounds.  Pulmonary:     Effort: Pulmonary effort is normal. No tachypnea or respiratory distress.     Breath sounds: Normal breath sounds and air entry. No decreased breath sounds.  Chest:     Chest wall: No tenderness.  Abdominal:     General: Abdomen is flat. Bowel sounds are normal. There is no distension.     Palpations: Abdomen is soft.     Tenderness: There is abdominal tenderness in the left upper quadrant and left lower quadrant.  Musculoskeletal:     Right lower leg: No edema.     Left lower leg: No edema.  Skin:    General: Skin is warm and dry.     Capillary Refill: Capillary refill takes less than 2 seconds.  Neurological:     Mental Status: He is alert. Mental status is at baseline.  Psychiatric:        Mood and Affect: Mood normal.        Behavior: Behavior normal.     ED Results / Procedures / Treatments   Labs (all labs ordered are listed,  but only abnormal results are displayed) Labs Reviewed  BASIC METABOLIC PANEL - Abnormal; Notable for the following components:      Result Value   Glucose, Bld 409 (*)    All other components within normal limits  CBG MONITORING, ED - Abnormal; Notable for the following components:   Glucose-Capillary 144 (*)    All other components within normal limits  CBC  TROPONIN I (HIGH SENSITIVITY)  TROPONIN I (HIGH SENSITIVITY)    EKG EKG Interpretation  Date/Time:  Tuesday November 28 2022 11:16:45 EST Ventricular Rate:  94 PR Interval:  152 QRS Duration: 140 QT Interval:  392 QTC Calculation: 490 R Axis:   -66 Text Interpretation: Normal sinus rhythm Left axis deviation Right bundle branch block Abnormal ECG When compared with ECG of 28-Nov-2022 10:48, No significant change since last tracing Confirmed by Benjiman Core [phone]) on 11/28/2022 4:23:49 PM  Radiology CT ABDOMEN PELVIS W CONTRAST  Result Date: 11/28/2022 CLINICAL DATA:  Left lower quadrant abdominal pain EXAM: CT ABDOMEN AND PELVIS WITH CONTRAST TECHNIQUE: Multidetector CT imaging of the abdomen and pelvis was performed using the standard protocol following bolus administration of intravenous contrast. RADIATION DOSE REDUCTION: This exam was performed according to the departmental dose-optimization program which includes automated exposure control, adjustment of the mA and/or kV according to patient size and/or use of iterative reconstruction technique. CONTRAST:  58mL OMNIPAQUE IOHEXOL 350 MG/ML SOLN COMPARISON:  11/07/2013 FINDINGS: Lower chest: No acute abnormality. Hepatobiliary: No focal liver abnormality is seen. No gallstones, gallbladder wall thickening, or biliary dilatation. Pancreas: Unremarkable Spleen: Unremarkable Adrenals/Urinary Tract: The adrenal glands are unremarkable. The kidneys are normal. The bladder is mildly distended, but is otherwise unremarkable. Stomach/Bowel: Moderate colonic stool burden with moderate  stool within the rectal vault possibly reflecting changes of constipation. Moderate diverticulosis of the cecum without superimposed acute inflammatory change. The stomach, small bowel, and large bowel are otherwise unremarkable. Appendix normal. No free intraperitoneal gas or fluid. Vascular/Lymphatic: Aortic atherosclerosis. No enlarged abdominal or pelvic lymph nodes. Reproductive: Prostate is unremarkable. Other: Moderate fat containing right inguinal hernia. Musculoskeletal: Bilateral L5 pars defects are present without associated spondylolisthesis. No acute bone abnormality. No lytic or blastic bone lesion. IMPRESSION: 1. No acute intra-abdominal pathology identified. No definite radiographic explanation for the patient's reported symptoms. 2. Moderate colonic stool burden with moderate stool within the rectal vault possibly reflecting changes of constipation. 3. Moderate cecal diverticulosis without superimposed acute inflammatory change. 4. Moderate fat containing right inguinal hernia. 5. 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 6. Aortic atherosclerosis. Aortic Atherosclerosis (ICD10-I70.0). Electronically Signed   By: Helyn Numbers M.D.   On: 11/28/2022 21:52   DG Chest 1 View  Result Date: 11/28/2022 CLINICAL DATA:  47 year old male with left side chest pain. EXAM: CHEST  1 VIEW COMPARISON:  Chest radiograph 11/07/2013 and earlier. FINDINGS: PA view at 1220 hours. Lung volumes and mediastinal contours are normal. Visualized tracheal air column is within normal limits. Lung markings appear stable, both lungs appear clear. No pneumothorax or pleural effusion. Negative visible bowel gas. No osseous abnormality identified. IMPRESSION: Negative, no cardiopulmonary abnormality. Electronically Signed   By: Odessa Fleming M.D.   On: 11/28/2022 12:34    Procedures Procedures    Medications Ordered in ED Medications  sodium chloride 0.9 % bolus 1,000 mL (0 mLs Intravenous Stopped 11/28/22  1924)  iohexol (OMNIPAQUE) 350 MG/ML injection 75 mL (75 mLs Intravenous Contrast Given 11/28/22 2017)    ED Course/ Medical Decision Making/ A&P                           Medical Decision Making Amount and/or Complexity of Data Reviewed Radiology: ordered.  Risk Prescription drug management.   This patient presents to the ED with chief complaint(s) of left-sided chest pain that radiates into the left neck and left arm with pertinent past medical history of insulin-dependent diabetes mellitus.The complaint involves an extensive differential diagnosis and also carries with it a high risk of complications and morbidity.    The differential diagnosis includes ACS, DKA,  hyperglycemia, hypoglycemia, noncardiac chest pain, neuropathy  The initial plan is to obtain baseline labs, chest x-ray, and EKG  Additional history obtained: Additional history obtained from  none Records reviewed Primary Care Documents  Initial Assessment:   On exam, patient is resting comfortably in bed.  Heart rate is normal with regular rhythm.  Chest pain is nonreproducible with deep inspiration and palpation.  Lungs are clear to auscultation bilaterally.  Abdomen is soft with mild tenderness to palpation of both the left quadrants.  Bowel sounds are normal.  Skin is warm and dry, nondiaphoretic.  Patient states he is not currently having any chest pain or related symptoms.  Independent ECG/labs interpretation:  The following labs were independently interpreted:  Troponin negative.  CBC without evidence of leukocytosis or anemia. Metabolic panel significant for hyperglycemia with a glucose of 409.  Kidney function is normal. ECG demonstrates sinus rhythm with right bundle branch block.  No evidence of ischemia or infarction.  Independent visualization and interpretation of imaging: I independently visualized the following imaging with scope of interpretation limited to determining acute life threatening conditions  related to emergency care: Chest x-ray, which revealed no evidence of pneumonia, pneumothorax, or pleural effusions.  Heart is of normal size.  I agree with radiologist interpretation  CT abdomen pelvis was obtained as well due to patient complaining of intermittent left-sided abdominal pain for the past week.  No evidence of acute intra-abdominal pathology or inflammatory changes.  Patient does have a moderate stool burden which may reflect some constipation.  Treatment and Reassessment: Will treat patient with NS fluid bolus and reassess CBG once this has been completed.  Patient is not currently having any symptoms of chest pain and appears to be resting comfortably.  His troponin was negative.  ECG did have RBBB but did not have evidence of active ischemia or infarction.  Glucose improved from 409 to 144.  Plan to refer patient to cardiology and will have patient follow-up with primary care provider as well.  Disposition:   The patient has been appropriately medically screened and/or stabilized in the ED. I have low suspicion for any other emergent medical condition which would require further screening, evaluation or treatment in the ED or require inpatient management. At time of discharge the patient is hemodynamically stable and in no acute distress. I have discussed work-up results and diagnosis with patient and answered all questions. Patient is agreeable with discharge plan. We discussed strict return precautions for returning to the emergency department and they verbalized understanding.  Patient was referred to cardiology for follow-up regarding episodes of chest pain.  Will also have patient follow-up again with primary care provider regarding his blood sugar.  Advised patient to check his blood sugar regularly at home and keep a log.  Patient was given information about constipation and over-the-counter medications and dietary changes he can try.         Final Clinical Impression(s) /  ED Diagnoses Final diagnoses:  Chest pain, unspecified type  Hyperglycemia  Constipation, unspecified constipation type    Rx / DC Orders ED Discharge Orders          Ordered    Ambulatory referral to Cardiology       Comments: If you have not heard from the Cardiology office within the next 72 hours please call [phone].   11/28/22 1936              Lenard Simmer, [place] 11/28/22 2202    Benjiman Core, MD  11/29/22 1448  

## 2022-11-28 NOTE — Discharge Instructions (Addendum)
Thank you for allowing me to be part of your care today.  Your workup was reassuring.  You did have high blood sugar that I recommend following up with your primary care provider about.  I advise you to check your blood sugars regularly at home and keep a log.  I am referring you to cardiology to follow-up regarding your chest pain.  Your imaging was reassuring for no acute abnormality in your abdomen aside from moderate constipation.  I recommend using Colace or MiraLAX which are stool softeners.  I have also attached information about constipation and dietary changes you can make.  Return to the ER if you develop any worsening symptoms or have any new concerns.

## 2022-11-28 NOTE — Progress Notes (Signed)
CC: Diabetes follow up and chest pain  HPI:  Jason [name] is a 47 y.o. male living with a history stated below and presents today for diabetes follow up and chest pain. Please see problem based assessment and plan for additional details.  Past Medical History:  Diagnosis Date   Diabetes mellitus 03/2008   diagnosed in april when admitted to [facility] with blood glucose of 581. HbA1C was 9.9   DKA (diabetic ketoacidoses)    hospitalized in 03/10/08   Latent tuberculosis infection    PPD test measured 12 mm on 05/25/08. CXR on 06/03/08 showed no active disease or adenopathy. Started to take isoniazid 300 mg daily. on 06/11/08 and will  take medication  along with Vit  B6 for 9 months.   Rheumatoid arthritis(714.0)    managed by Dr. Pollyann Savoy. sronegative. Dr. 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    Current Outpatient Medications on File Prior to Visit  Medication Sig Dispense Refill   Alcohol Swabs (ALCOHOL WIPES) 70 % PADS Please use an alcohol wipe to cleanse your skin before each insulin injection 100 each 0   Blood Glucose Monitoring Suppl (AGAMATRIX PRESTO PRO METER) DEVI 1 Units by Does not apply route 3 (three) times daily before meals. (Patient not taking: Reported on 12/01/2021) 1 Device 0   Continuous Blood Gluc Receiver (FREESTYLE LIBRE 2 READER) DEVI Use to continuously monitor blood sugars 1 each 1   Continuous Blood Gluc Sensor (FREESTYLE LIBRE 2 SENSOR) MISC Use to continuously monitor blood sugars 2 each 11   glucose blood test strip Use to test blood sugar three times daily 100 each 6   insulin aspart (NOVOLOG FLEXPEN) 100 UNIT/ML FlexPen Inject 10 Units into the skin 2 (two) times daily with a meal. 15 mL 2   insulin detemir (LEVEMIR FLEXPEN) 100 UNIT/ML FlexPen Inject 25 Units into the skin daily. 15 mL 11   Insulin Pen Needle (UNIFINE PENTIPS) 32G X 4 MM MISC Use as directed with insulin three times daily.  100 each 2   Insulin Syringe-Needle U-100 (ULTICARE INSULIN SYRINGE) 31G X 1/4" 1 ML MISC Use twice daily for injecting insulin (Patient not taking: Reported on 12/01/2021) 100 each 4   Insulin Syringes, Disposable, U-100 1 ML MISC Use to inject insulin twice daily. IM program. (Patient not taking: Reported on 12/01/2021) 100 each 6   Microlet Lancets MISC Use 3 times daily before meals (Patient not taking: Reported on 12/01/2021) 100 each 6   moxifloxacin (VIGAMOX) 0.5 % ophthalmic solution Place 1 drop into the left eye 4 (four) times daily. Begin one day after surgery, continue as directed. 3 mL 5   ofloxacin (OCUFLOX) 0.3 % ophthalmic solution Place 1 drop into the right eye 4 (four) times daily. Begin one day prior to surgery, Continue as directed. 5 mL 5   ofloxacin (OCUFLOX) 0.3 % ophthalmic solution Place 1 drop into the right eye 4 (four) times daily. Begin one day prior to surgery. Continue as directed 5 mL 5   prednisoLONE acetate (PRED FORTE) 1 % ophthalmic suspension Place 1 drop into the right eye 4 (four) times daily. Begin one day after surgery, continue as directed 10 mL 5   prednisoLONE acetate (PRED FORTE) 1 % ophthalmic suspension Place 1 drop into the right eye 4 (four) times daily. Begin one day after surgery, continue as directed 10 mL 5   prednisoLONE acetate (PRED FORTE) 1 %  ophthalmic suspension Place 1 drop into the left eye 4 (four) times daily. Begin one day after surgery, continue as directed. 5 mL 5   pregabalin (LYRICA) 75 MG capsule Take 1 capsule (75 mg total) by mouth 3 (three) times daily. 90 capsule 2   No current facility-administered medications on file prior to visit.    Family History  Problem Relation Age of Onset   Diabetes Cousin    Hypertension Cousin    Diabetes Mother     Social History   Socioeconomic History   Marital status: Single    Spouse name: Not on file   Number of children: 1   Years of education: 9   Highest education level: Not on  file  Occupational History    Employer: [employer]  Tobacco Use   Smoking status: Never   Smokeless tobacco: Never  Substance and Sexual Activity   Alcohol use: No   Drug use: No   Sexual activity: Not on file  Other Topics Concern   Not on file  Social History Narrative   Works Holiday representative jobs occasionally, 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   Social Determinants of Health   Financial Resource Strain: Not on file  Food Insecurity: Not on file  Transportation Needs: Not on file  Physical Activity: Not on file  Stress: Not on file  Social Connections: Not on file  Intimate Partner Violence: Not on file    Review of Systems: ROS negative except for what is noted on the assessment and plan.  Vitals:   11/28/22 1108  BP: 110/66  Pulse: 91  Temp: (!) 97.5 F (36.4 C)  TempSrc: Oral  SpO2: 97%  Weight: 140 lb 1.6 oz (63.5 kg)  Height: 5\' 3"  (1.6 m)    Physical Exam: Constitutional: in no acute distress HENT: normocephalic atraumatic Eyes: conjunctiva non-erythematous Neck: supple Cardiovascular: regular rate and rhythm, no m/r/g. Chest palpitation does not elicit pain Pulmonary/Chest: normal work of breathing on room air MSK: normal bulk and tone Neurological: alert & oriented x 3 Skin: warm and dry Psych: normal mood  Assessment & Plan:   Unstable angina (HCC) Assessment: Presents today with an episode of stabbing chest pain that onset 4 days ago. The pain woke him from his sleep and persisted since. The pain is left sided and radiates into his left neck and left arm periodically. It is associated with suffocating feeling and diaphoresis. He has had similar episode 7 months ago that resolved after 2 days. The pain is still present but significantly improved. The pain is not reproducible.   Vital signs unremarkable. On exam he has a regular rate and rhythm without murmurs, gallops, rubs.   Multiple risk factors for ACS including HLD, T2DM uncontrolled,  and RA. No prior cardiac history. EKG with st depression in lateral leads, LAD, and RBBB. Prolonged QTC at 489. Prior EKG from 2009 without abnormalities.   Will send to ED for immediate evaluation with concern for ACS event.  Plan: - Send to ED for further cardiac evaluation and possible admisssion   Type 2 diabetes mellitus with diabetic neuropathy, with long-term current use of insulin (HCC) Assessment: Continues to have uncontrolled T2DM, current regimen of novlog 15 U TID, levemin 25 U daily. He denies being on victoza. He does not regularly check his glucose levels as his glucometer has been broken. He has a CGM at home but prefers the finger sticks. Discussed need to check glucose levels so we can adjust insulin levels  accordingly. Will give new glucometer and refill victoza. Foot exam at follow up visit as sent to ED with concern for ACS  Plan: - continue novolog 15 U Tid levemir 25 U daily and restart victoza .6 mg  Patient discussed with Dr. Donnita Falls, D.O. [facility], PGY-3 Phone: [phone] Date 11/28/2022 Time 11:14 AM

## 2022-11-28 NOTE — Assessment & Plan Note (Addendum)
Assessment: Presents today with an episode of stabbing chest pain that onset 4 days ago. The pain woke him from his sleep and persisted since. The pain is left sided and radiates into his left neck and left arm periodically. It is associated with suffocating feeling and diaphoresis. He has had similar episode 7 months ago that resolved after 2 days. The pain is still present but significantly improved. The pain is not reproducible.   Vital signs unremarkable. On exam he has a regular rate and rhythm without murmurs, gallops, rubs.   Multiple risk factors for ACS including HLD, T2DM uncontrolled, and RA. No prior cardiac history. EKG with st depression in lateral leads, LAD, and RBBB. Prolonged QTC at 489. Prior EKG from 2009 without abnormalities.   Will send to ED for immediate evaluation with concern for ACS event.  Plan: - Send to ED for further cardiac evaluation and possible admisssion

## 2022-11-28 NOTE — ED Triage Notes (Signed)
Pt c/o 7/10 pricking left sided chest pain that radiates to left neck and down left armx3d. Pt deies N/V, SOB.

## 2022-11-28 NOTE — Assessment & Plan Note (Addendum)
Assessment: Continues to have uncontrolled T2DM, current regimen of novlog 15 U TID, levemin 25 U daily. He denies being on victoza. He does not regularly check his glucose levels as his glucometer has been broken. He has a CGM at home but prefers the finger sticks. Discussed need to check glucose levels so we can adjust insulin levels accordingly. Will give new glucometer and refill victoza. Foot exam at follow up visit as sent to ED with concern for ACS  Plan: - continue novolog 15 U Tid levemir 25 U daily and restart victoza .6 mg

## 2022-11-29 ENCOUNTER — Other Ambulatory Visit (HOSPITAL_COMMUNITY): Payer: Self-pay

## 2022-11-29 NOTE — Addendum Note (Signed)
Addended by: Riesa Pope on: 11/29/2022 04:10 PM   Modules accepted: Orders

## 2022-11-29 NOTE — Progress Notes (Signed)
Internal Medicine Clinic Attending  Case discussed with Dr. Katsadouros  At the time of the visit.  We reviewed the resident's history and exam and pertinent patient test results.  I agree with the assessment, diagnosis, and plan of care documented in the resident's note.  

## 2022-12-05 NOTE — Progress Notes (Incomplete)
CC: ***  HPI:   [name] is a 47 y.o. male with a past medical history of diabetes complicated by DKA and rheumatoid arthritis who presents for ED follow-up. He visited the ED on 1-9 for chest pain, subsequent workup for ischemic event was negative, and he was discharged with a referral to cardiology. Last [facility] visit 11-28-2022.   Will treat patient with NS fluid bolus and reassess CBG once this has been completed.  Patient is not currently having any symptoms of chest pain and appears to be resting comfortably.  His troponin was negative.  ECG did have RBBB but did not have evidence of active ischemia or infarction.  Glucose improved from 409 to 144.  Plan to refer patient to cardiology and will have patient follow-up with primary care provider as well.    Unstable angina (HCC) Assessment: Presents today with an episode of stabbing chest pain that onset 4 days ago. The pain woke him from his sleep and persisted since. The pain is left sided and radiates into his left neck and left arm periodically. It is associated with suffocating feeling and diaphoresis. He has had similar episode 7 months ago that resolved after 2 days. The pain is still present but significantly improved. The pain is not reproducible.  Vital signs unremarkable. On exam he has a regular rate and rhythm without murmurs, gallops, rubs.  Multiple risk factors for ACS including HLD, T2DM uncontrolled, and RA. No prior cardiac history. EKG with st depression in lateral leads, LAD, and RBBB. Prolonged QTC at 489. Prior EKG from 2009 without abnormalities.  Will send to ED for immediate evaluation with concern for ACS event. Plan: - Send to ED for further cardiac evaluation and possible admisssion   Xxx      Type 2 diabetes mellitus with diabetic neuropathy, with long-term current use of insulin ([place]) Assessment: Continues to have uncontrolled T2DM, current regimen of novlog 15 U TID, levemin 25 U daily. He denies  being on victoza. He does not regularly check his glucose levels as his glucometer has been broken. He has a CGM at home but prefers the finger sticks. Discussed need to check glucose levels so we can adjust insulin levels accordingly. Will give new glucometer and refill victoza. Foot exam at follow up visit as sent to ED with concern for ACS Plan: - continue novolog 15 U Tid levemir 25 U daily and restart victoza .6 mg  Xxxx     Past Medical History:  Diagnosis Date   Diabetes mellitus 03/2008   diagnosed in april when admitted to [facility] with blood glucose of 581. HbA1C was 9.9   DKA (diabetic ketoacidoses)    hospitalized in 03/10/08   Latent tuberculosis infection    PPD test measured 12 mm on 05/25/08. CXR on 06/03/08 showed no active disease or adenopathy. Started to take isoniazid 300 mg daily. on 06/11/08 and will  take medication  along with Vit  B6 for 9 months.   Rheumatoid arthritis(714.0)    managed by Dr. Bo Merino. sronegative. Dr. 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     Review of Systems:    Reports *** Denies *** (subjective fever?, pain anywhere?, bowel changes?)   Physical Exam:  There were no vitals filed for this visit.  General:   awake and alert, sitting comfortably in chair, cooperative, not in acute distress Skin:   warm and dry, intact without any obvious  lesions or scars, no rashes or lesions  Head:   normocephalic and atraumatic, oral mucosa moist with good dentition, no lymphadenopathy Eyes:   extraocular movements intact, conjunctivae pink, pupils round and reactive to light, no periorbital swelling or scleral icterus Ears:   pinnae normal, no discharge or external lesions  Nose:   symmetrical and without mucosal inflammation, no external lesions or discharge Lungs:   normal respiratory effort, breathing unlabored, symmetrical chest rise, no crackles or wheezing Cardiac:   regular rate and  rhythm, normal S1 and S2, capillary refill 2-3 seconds, dorsalis pedis pulses intact bilaterally, no pitting edema Abdomen:   soft and non-distended, normoactive bowel sounds present in all four quadrants, no guarding or palpable masses Musculoskeletal:   full range of motion in joints, motor strength 5 /5 in all four extremities, no obvious deformities or joint tenderness Neurologic:   oriented to person-place-time, moving all extremities, sensation to light touch intact, no facial droop Psychiatric:   euthymic mood with congruent affect, intelligible speech    Assessment & Plan:   No problem-specific Assessment & Plan notes found for this encounter.     See Encounters Tab for problem based charting.  Patient {GC/GE:3044014::"discussed with","seen with"} Dr. {NAMES:[id number]::"Guilloud","Hoffman","Mullen","Narendra","Williams","Vincent"}

## 2022-12-07 ENCOUNTER — Encounter: Payer: Self-pay | Admitting: Student

## 2022-12-07 NOTE — Progress Notes (Incomplete)
CC: ***  HPI:   [name] is a 47 y.o. male with a past medical history of diabetes complicated by DKA, tuberculosis post treatment, and rheumatoid arthritis who presents for hospital follow-up. He visited the [facility] ED on 11-28-2022 for chest pain, workup was negative for ischemic event, and then discharged with referral to cardiology. Last [facility] visit was on 11-28-2022, which addressed unstable angina and diabetes management.   Will treat patient with NS fluid bolus and reassess CBG once this has been completed.  Patient is not currently having any symptoms of chest pain and appears to be resting comfortably.  His troponin was negative.  ECG did have RBBB but did not have evidence of active ischemia or infarction.  Glucose improved from 409 to 144.  Plan to refer patient to cardiology and will have patient follow-up with primary care provider as well.    Unstable angina (HCC) Assessment: Presents today with an episode of stabbing chest pain that onset 4 days ago. The pain woke him from his sleep and persisted since. The pain is left sided and radiates into his left neck and left arm periodically. It is associated with suffocating feeling and diaphoresis. He has had similar episode 7 months ago that resolved after 2 days. The pain is still present but significantly improved. The pain is not reproducible.  Vital signs unremarkable. On exam he has a regular rate and rhythm without murmurs, gallops, rubs.  Multiple risk factors for ACS including HLD, T2DM uncontrolled, and RA. No prior cardiac history. EKG with st depression in lateral leads, LAD, and RBBB. Prolonged QTC at 489. Prior EKG from 2009 without abnormalities.  Will send to ED for immediate evaluation with concern for ACS event. Plan: - Send to ED for further cardiac evaluation and possible admission  Xxx Have his symptoms resolved . . . Chest pain, pressure, tightness, exertional dyspnea, breath shortness, diaphoresis,  palpitations Referral to cardiology . . . Appointment scheduled for 2-8     Type 2 diabetes mellitus with diabetic neuropathy, with long-term current use of insulin (HCC) Assessment: Continues to have uncontrolled T2DM, current regimen of novlog 15 U TID, levemin 25 U daily. He denies being on victoza. He does not regularly check his glucose levels as his glucometer has been broken. He has a CGM at home but prefers the finger sticks. Discussed need to check glucose levels so we can adjust insulin levels accordingly. Will give new glucometer and refill victoza. Foot exam at follow up visit as sent to ED with concern for ACS Plan: - continue novolog aspart 15 U Tid levemir detemir 25 U daily and restart victoza liraglutide .6 mg  Xxxx Most recent A1C 13.3 on 11-28-2022 Taking these medications? Glucose values at home? Can uptitrate to 1.8mg  q168 - if no diarrhea, abdominal pain, bloating, headache, nausea, constipation, sweating Diabetes educator referral? Foot exam today!     Past Medical History:  Diagnosis Date   Diabetes mellitus 03/2008   diagnosed in april when admitted to [facility] with blood glucose of 581. HbA1C was 9.9   DKA (diabetic ketoacidoses)    hospitalized in 03/10/08   Latent tuberculosis infection    PPD test measured 12 mm on 05/25/08. CXR on 06/03/08 showed no active disease or adenopathy. Started to take isoniazid 300 mg daily. on 06/11/08 and will  take medication  along with Vit  B6 for 9 months.   Rheumatoid arthritis(714.0)    managed by Dr. Bo Merino. sronegative. Dr. Estanislado Pandy wanted  to start him on Methotrxate but patient was found to be  PPD positive.     Review of Systems:    Reports *** Denies *** (subjective fever?, pain anywhere?, bowel changes?)   Physical Exam:  There were no vitals filed for this visit.  General:   awake and alert, sitting comfortably in chair, cooperative, not in acute distress Skin:   warm and dry, intact without  any obvious lesions or scars, no rashes or lesions  Head:   normocephalic and atraumatic, oral mucosa moist with good dentition, no lymphadenopathy Eyes:   extraocular movements intact, conjunctivae pink, pupils round and reactive to light, no periorbital swelling or scleral icterus Ears:   pinnae normal, no discharge or external lesions  Nose:   symmetrical and without mucosal inflammation, no external lesions or discharge Lungs:   normal respiratory effort, breathing unlabored, symmetrical chest rise, no crackles or wheezing Cardiac:   regular rate and rhythm, normal S1 and S2, capillary refill 2-3 seconds, dorsalis pedis pulses intact bilaterally, no pitting edema Abdomen:   soft and non-distended, normoactive bowel sounds present in all four quadrants, no guarding or palpable masses Musculoskeletal:   full range of motion in joints, motor strength 5 /5 in all four extremities, no obvious deformities or joint tenderness Neurologic:   oriented to person-place-time, moving all extremities, sensation to light touch intact, no facial droop Psychiatric:   euthymic mood with congruent affect, intelligible speech    Assessment & Plan:   No problem-specific Assessment & Plan notes found for this encounter.     See Encounters Tab for problem based charting.  Patient {GC/GE:3044014::"discussed with","seen with"} Dr. {NAMES:[id number]::"Guilloud","Hoffman","Mullen","Narendra","Williams","Vincent"}

## 2022-12-12 ENCOUNTER — Encounter: Payer: Self-pay | Admitting: Student

## 2022-12-12 NOTE — Progress Notes (Incomplete)
CC: Follow-up (angina + diabetes)  HPI:   [name] is a 47 y.o. male with a past medical history of diabetes complicated by DKA and treated tuberculosis who presents for ED follow-up. He visited the ED on 1-9 for chest pain, diagnostic workup for acute ischemia was negative, and discharged with referral to cardiology.    Past Medical History:  Diagnosis Date   Diabetes mellitus 03/2008   diagnosed in april when admitted to [facility] with blood glucose of 581. HbA1C was 9.9   DKA (diabetic ketoacidoses)    hospitalized in 03/10/08   Latent tuberculosis infection    PPD test measured 12 mm on 05/25/08. CXR on 06/03/08 showed no active disease or adenopathy. Started to take isoniazid 300 mg daily. on 06/11/08 and will  take medication  along with Vit  B6 for 9 months.   Rheumatoid arthritis(714.0)    managed by Dr. Bo Merino. sronegative. Dr. 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     Review of Systems:    Reports feeling good overall Denies chest pain, palpitations, pressure, tightness, diaphoresis, nausea, bloating, diarrhea   Physical Exam:  Vitals:   12/14/22 1351  BP: 101/78  Pulse: 95  Temp: 98 F (36.7 C)  TempSrc: Oral  SpO2: 97%  Weight: 139 lb 4.8 oz (63.2 kg)  Height: 5\' 3"  (1.6 m)    General:   awake and alert, sitting comfortably in chair, cooperative and pleasant, not in acute distress, accompanied by daughter who assisted communication Skin:   warm and dry, intact without any obvious lesions or scars, no rashes or lesions  Lungs:   normal respiratory effort, breathing unlabored, symmetrical chest rise, no crackles or wheezing Cardiac:   regular rate and rhythm, normal S1 and S2, dorsalis pedis pulses intact bilaterally, no pitting edema Neurologic:   oriented to person-place-time, moving all extremities, no gross focal deficits Psychiatric:   euthymic mood with congruent affect, intelligible  speech    Assessment & Plan:   Stable angina Patient was last seen in clinic on January 9, at which point he was reporting chest pain and tightness.  He was, therefore, directed to the ED where diagnostic workup for acute ischemia was negative and he was discharged with instructions to follow-up with cardiology.  Since returning home from the ED, the patient denies experiencing any chest pain, palpitations, pressure, tightness, diaphoresis, and nausea.  He has a cardiology visit on February 8.  - Return to clinic or ED if chest pain returns - Cardiology follow-up on 12-28-2022     Type 2 diabetes mellitus with diabetic neuropathy, with long-term current use of insulin ([place]) Patient has a history of diabetes, most recent A1C 13.3 on 11-28-2022.  At his last clinic visit on 1-9, he was counseled on the importance of checking his blood sugar levels at home, which he had not been doing previously.  He was offered a CGM, but declined, preferring instead to check his glucose manually.  After that visit, he annularly checked his blood glucose levels for about 1 week before his meter ran out of batteries and he never replaced them.  He states that his levels were typically in the 300-330s range after eating in 200-220s range in the morning after waking up.  He reports taking 25 units detemir in the morning and 15 units aspart with meals, sometimes he will skip a large meal and appropriately foregoes the corresponding administration of aspart  insulin.  Additionally, he self discontinued liraglutide 1 month ago due to some mild non-pruritic, non-erythematous, self-resolving swelling at the injection site. In the past, he has tried metformin, but discontinued it due to gastrointestinal side effects. He did not bring his glucose meter in with him to today's visit.  Instructed patient to bring his glucose meter with him at his next visit and encouraged him to resume taking liraglutide, to which he agrees. Diabetic  foot exam performed today, last ophthalmologic exam 04-2022.  - Continue insulin aspart 15U q12 with meals - Increase insulin detemir from 25U to 30U - Resume liraglutide 0.6mg  q168 - Review glucometer at next visit - Check uAlb:Cr    Preventative health care Patient due for influenza vaccine today and was agreeable to receiving it.  - Administer influenza vaccine       See Encounters Tab for problem based charting.  Patient discussed with Dr. Evette Doffing

## 2022-12-14 ENCOUNTER — Encounter: Payer: Self-pay | Admitting: Student

## 2022-12-14 ENCOUNTER — Other Ambulatory Visit (HOSPITAL_COMMUNITY): Payer: Self-pay

## 2022-12-14 ENCOUNTER — Ambulatory Visit: Payer: Self-pay | Admitting: Student

## 2022-12-14 VITALS — BP 101/78 | HR 95 | Temp 98.0°F | Ht 63.0 in | Wt 139.3 lb

## 2022-12-14 DIAGNOSIS — I2089 Other forms of angina pectoris: Secondary | ICD-10-CM

## 2022-12-14 DIAGNOSIS — Z23 Encounter for immunization: Secondary | ICD-10-CM

## 2022-12-14 DIAGNOSIS — Z0000 Encounter for general adult medical examination without abnormal findings: Secondary | ICD-10-CM

## 2022-12-14 DIAGNOSIS — E1140 Type 2 diabetes mellitus with diabetic neuropathy, unspecified: Secondary | ICD-10-CM

## 2022-12-14 DIAGNOSIS — Z794 Long term (current) use of insulin: Secondary | ICD-10-CM

## 2022-12-14 MED ORDER — LEVEMIR FLEXPEN 100 UNIT/ML ~~LOC~~ SOPN
30.0000 [IU] | PEN_INJECTOR | Freq: Every day | SUBCUTANEOUS | 11 refills | Status: DC
  Filled 2022-12-14: qty 15, 50d supply, fill #0
  Filled 2022-12-27: qty 9, 30d supply, fill #0
  Filled 2023-01-29: qty 9, 30d supply, fill #1

## 2022-12-14 NOTE — Patient Instructions (Signed)
  Thank you, [name], for allowing [place] to provide your care today. Today we discussed . . .  > Diabetes        - continue to take your insulin throughout the day and semaglutide weekly as prescribed       - also continue to check your blood sugars and please bring your meter to your next visit with [place] in two months       - we are increasing the dose of your insulin detemir from 25U to 30U, which should help improve your glucose levels > Chest pain       - please follow up with your cardiologist on 2-8    I have ordered the following labs for you:   Lab Orders         Microalbumin / Creatinine Urine Ratio       Tests ordered today:  none   Referrals ordered today:   Referral Orders  No referral(s) requested today      I have ordered the following medication/changed the following medications:   Stop the following medications: There are no discontinued medications.   Start the following medications: No orders of the defined types were placed in this encounter.     Follow up: 2 months    Remember:  Please continue to take your insulin throughout the day and resume taking semaglutide weekly. Bring your glucose meter with you to your next visit. We will see you again in about two months!   Should you have any questions or concerns please call the internal medicine clinic at [phone].     Jason Nickel, MD [place]

## 2022-12-14 NOTE — Assessment & Plan Note (Addendum)
Patient was last seen in clinic on January 9, at which point he was reporting chest pain and tightness.  He was, therefore, directed to the ED where diagnostic workup for acute ischemia was negative and he was discharged with instructions to follow-up with cardiology.  Since returning home from the ED, the patient denies experiencing any chest pain, palpitations, pressure, tightness, diaphoresis, and nausea.  He has a cardiology visit on February 8.  - Return to clinic or ED if chest pain returns - Cardiology follow-up on 12-28-2022

## 2022-12-14 NOTE — Assessment & Plan Note (Addendum)
Patient has a history of diabetes, most recent A1C 13.3 on 11-28-2022.  At his last clinic visit on 1-9, he was counseled on the importance of checking his blood sugar levels at home, which he had not been doing previously.  He was offered a CGM, but declined, preferring instead to check his glucose manually.  After that visit, he annularly checked his blood glucose levels for about 1 week before his meter ran out of batteries and he never replaced them.  He states that his levels were typically in the 300-330s range after eating in 200-220s range in the morning after waking up.  He reports taking 25 units detemir in the morning and 15 units aspart with meals, sometimes he will skip a large meal and appropriately foregoes the corresponding administration of aspart insulin.  Additionally, he self discontinued liraglutide 1 month ago due to some mild non-pruritic, non-erythematous, self-resolving swelling at the injection site. In the past, he has tried metformin, but discontinued it due to gastrointestinal side effects. He did not bring his glucose meter in with him to today's visit.  Instructed patient to bring his glucose meter with him at his next visit and encouraged him to resume taking liraglutide, to which he agrees. Diabetic foot exam performed today, last ophthalmologic exam 04-2022.  - Continue insulin aspart 15U q12 with meals - Increase insulin detemir from 25U to 30U - Resume liraglutide 0.6mg  q168 - Review glucometer at next visit - Check uAlb:Cr

## 2022-12-14 NOTE — Assessment & Plan Note (Signed)
Patient due for influenza vaccine today and was agreeable to receiving it.  - Administer influenza vaccine

## 2022-12-15 LAB — MICROALBUMIN / CREATININE URINE RATIO
Creatinine, Urine: 311.6 mg/dL
Microalb/Creat Ratio: 194 mg/g creat — ABNORMAL HIGH (ref 0–29)
Microalbumin, Urine: 604.3 ug/mL

## 2022-12-15 NOTE — Progress Notes (Signed)
Internal Medicine Clinic Attending  Case discussed with Dr. Jodi Mourning  At the time of the visit.  We reviewed the resident's history and exam and pertinent patient test results.  I agree with the assessment, diagnosis, and plan of care documented in the resident's note.

## 2022-12-21 ENCOUNTER — Other Ambulatory Visit (HOSPITAL_COMMUNITY): Payer: Self-pay

## 2022-12-27 ENCOUNTER — Other Ambulatory Visit (HOSPITAL_COMMUNITY): Payer: Self-pay

## 2022-12-28 ENCOUNTER — Ambulatory Visit: Payer: Self-pay | Admitting: Cardiology

## 2022-12-28 ENCOUNTER — Other Ambulatory Visit (HOSPITAL_COMMUNITY): Payer: Self-pay

## 2023-01-22 ENCOUNTER — Ambulatory Visit: Payer: Self-pay | Attending: Cardiology | Admitting: Cardiology

## 2023-01-22 ENCOUNTER — Encounter: Payer: Self-pay | Admitting: Cardiology

## 2023-01-22 ENCOUNTER — Encounter: Payer: Self-pay | Admitting: *Deleted

## 2023-01-22 VITALS — BP 120/80 | HR 101 | Ht 63.0 in | Wt 142.2 lb

## 2023-01-22 DIAGNOSIS — R072 Precordial pain: Secondary | ICD-10-CM

## 2023-01-22 NOTE — Patient Instructions (Signed)
Medication Instructions:  The current medical regimen is effective;  continue present plan and medications.  *If you need a refill on your cardiac medications before your next appointment, please call your pharmacy*  Testing/Procedures: Your physician has requested that you have a myoview. For further information please visit [email or web address]. Please follow instruction sheet, as given.  Follow-Up: At [facility], you and your health needs are our priority.  As part of our continuing mission to provide you with exceptional heart care, we have created designated Provider Care Teams.  These Care Teams include your primary Cardiologist (physician) and Advanced Practice Providers (APPs -  Physician Assistants and Nurse Practitioners) who all work together to provide you with the care you need, when you need it.  We recommend signing up for the patient portal called "MyChart".  Sign up information is provided on this After Visit Summary.  MyChart is used to connect with patients for Virtual Visits (Telemedicine).  Patients are able to view lab/test results, encounter notes, upcoming appointments, etc.  Non-urgent messages can be sent to your provider as well.   To learn more about what you can do with MyChart, go to [email or web address].    Your next appointment:   Follow up will be based on the results of the above testing.

## 2023-01-22 NOTE — Progress Notes (Signed)
Cardiology Office Note:    Date:  01/22/2023   ID:  Jason [name], DOB 10-May-1976, MRN [id number]  PCP:  Riesa Pope, [place] Providers Cardiologist:  None     Referring MD: Pat Kocher, PA    History of Present Illness:    Jason [name] is a 47 y.o. male here for evaluation of chest discomfort at the request of Theressa Stamps, PA.  In the emergency department on 11/28/2022 with uncontrolled diabetes and left-sided chest pain and a pricking like sensation radiating from left neck to left arm for about 3 days duration.  He states that he has the pain off and on quite frequently.  Usually when he bends over it feels better.  Sometimes he has to stretch and it may feel better.  Pain only lasts for a few seconds duration.  Does not seem to change with food.  Does not seem to be occur when traversing stairs or going up hills.  Non-smoker He is diabetic.  Uncontrolled.  Troponins were normal.  Sinus rhythm right bundle branch block.   Past Medical History:  Diagnosis Date   Diabetes mellitus 03/2008   diagnosed in april when admitted to [facility] with blood glucose of 581. HbA1C was 9.9   DKA (diabetic ketoacidoses)    hospitalized in 03/10/08   Latent tuberculosis infection    PPD test measured 12 mm on 05/25/08. CXR on 06/03/08 showed no active disease or adenopathy. Started to take isoniazid 300 mg daily. on 06/11/08 and will  take medication  along with Vit  B6 for 9 months.   Rheumatoid arthritis(714.0)    managed by Dr. Bo Merino. sronegative. Dr. 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    Past Surgical History:  Procedure Laterality Date   EYE SURGERY     HERNIA REPAIR Right     Current Medications: Current Meds  Medication Sig   Alcohol Swabs (ALCOHOL WIPES) 70 % PADS Please use an alcohol wipe to cleanse your skin before each insulin injection   Blood Glucose Monitoring Suppl  (AGAMATRIX PRESTO PRO METER) DEVI 1 Units by Does not apply route 3 (three) times daily before meals.   Continuous Blood Gluc Receiver (FREESTYLE LIBRE 2 READER) DEVI Use to continuously monitor blood sugars   Continuous Blood Gluc Sensor (FREESTYLE LIBRE 2 SENSOR) MISC Use to continuously monitor blood sugars   glucose blood test strip Use to test blood sugar three times daily   insulin aspart (NOVOLOG FLEXPEN) 100 UNIT/ML FlexPen Inject 10 Units into the skin 2 (two) times daily with a meal.   insulin detemir (LEVEMIR FLEXPEN) 100 UNIT/ML FlexPen Inject 30 Units into the skin daily.   Insulin Pen Needle (UNIFINE PENTIPS) 32G X 4 MM MISC Use as directed with insulin three times daily.   Insulin Syringe-Needle U-100 (ULTICARE INSULIN SYRINGE) 31G X 1/4" 1 ML MISC Use twice daily for injecting insulin   Insulin Syringes, Disposable, U-100 1 ML MISC Use to inject insulin twice daily. IM program.   liraglutide (VICTOZA) 18 MG/3ML SOPN Inject 0.6 mg into the skin daily.   Microlet Lancets MISC Use 3 times daily before meals   moxifloxacin (VIGAMOX) 0.5 % ophthalmic solution Place 1 drop into the left eye 4 (four) times daily. Begin one day after surgery, continue as directed.   ofloxacin (OCUFLOX) 0.3 % ophthalmic solution Place 1 drop into the right eye 4 (four) times daily. Begin  one day prior to surgery, Continue as directed.   ofloxacin (OCUFLOX) 0.3 % ophthalmic solution Place 1 drop into the right eye 4 (four) times daily. Begin one day prior to surgery. Continue as directed   prednisoLONE acetate (PRED FORTE) 1 % ophthalmic suspension Place 1 drop into the right eye 4 (four) times daily. Begin one day after surgery, continue as directed   prednisoLONE acetate (PRED FORTE) 1 % ophthalmic suspension Place 1 drop into the right eye 4 (four) times daily. Begin one day after surgery, continue as directed   prednisoLONE acetate (PRED FORTE) 1 % ophthalmic suspension Place 1 drop into the left eye 4  (four) times daily. Begin one day after surgery, continue as directed.   pregabalin (LYRICA) 75 MG capsule Take 1 capsule (75 mg total) by mouth 3 (three) times daily.     Allergies:   Patient has no known allergies.   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   Social Determinants of Health   Financial Resource Strain: Not on file  Food Insecurity: Not on file  Transportation Needs: Not on file  Physical Activity: Not on file  Stress: Not on file  Social Connections: Not on file     Family History: The patient's family history includes Diabetes in his cousin and mother; Hypertension in his cousin.  ROS:   Please see the history of present illness.    Denies any fevers chills nausea vomiting syncope bleeding all other systems reviewed and are negative.  EKGs/Labs/Other Studies Reviewed:    The following studies were reviewed today: ER notes reviewed abdominal CT personally reviewed aortic atherosclerosis.  Mild.  EKG:  As above  Recent Labs: 03/09/2022: ALT 21 11/28/2022: BUN 16; Creatinine, Ser 0.62; Hemoglobin 14.4; Platelets 253; Potassium 4.0; Sodium 135  Recent Lipid Panel    Component Value Date/Time   CHOL 209 (H) 12/26/2021 1109   TRIG 257 (H) 12/26/2021 1109   HDL 52 12/26/2021 1109   CHOLHDL 4.0 12/26/2021 1109   CHOLHDL 3.4 11/08/2013 0655   VLDL 17 11/08/2013 0655   LDLCALC 113 (H) 12/26/2021 1109    Physical Exam:    VS:  BP 120/80   Pulse (!) 101   Ht '5\' 3"'$   (1.6 m)   Wt 142 lb 3.2 oz (64.5 kg)   SpO2 98%   BMI 25.19 kg/m     Wt Readings from Last 3 Encounters:  01/22/23 142 lb 3.2 oz (64.5 kg)  12/14/22 139 lb 4.8 oz (63.2 kg)  11/28/22 139 lb 15.9 oz (63.5 kg)     GEN:  Well nourished, well developed in no acute distress HEENT: Normal NECK: No JVD; No carotid bruits LYMPHATICS: No lymphadenopathy CARDIAC: RRR, no murmurs, rubs, gallops RESPIRATORY:  Clear to auscultation without rales, wheezing or rhonchi  ABDOMEN: Soft, non-tender, non-distended MUSCULOSKELETAL:  No edema; No deformity  SKIN: Warm and dry NEUROLOGIC:  Alert and  oriented x 3 PSYCHIATRIC:  Normal affect , anxious  ASSESSMENT:    1. Precordial pain    PLAN:    In order of problems listed above:  Precordial chest pain - Heart rate too fast for coronary CT scan.  Risk factors include uncontrolled diabetes.  Troponin is normal.  No prior evidence of thrombosis.  Anxious.  We will go ahead and check a pharmacologic stress test and check an echocardiogram as well.  We want to ensure proper structure and function of his heart.  Continue to encourage good blood glucose control.  Hemoglobin A1c was 13 at 1 point.  Aortic atherosclerosis - Noted on abdominal CT on 11/28/2022 personally reviewed and interpreted.  With his diabetes should be on statin therapy.  We will follow-up with results of testing      Shared Decision Making/Informed Consent The risks [chest pain, shortness of breath, cardiac arrhythmias, dizziness, blood pressure fluctuations, myocardial infarction, stroke/transient ischemic attack, nausea, vomiting, allergic reaction, radiation exposure, metallic taste sensation and life-threatening complications (estimated to be 1 in 10,000)], benefits (risk stratification, diagnosing coronary artery disease, treatment guidance) and alternatives of a nuclear stress test were discussed in detail with Mr. Freely and he agrees to proceed.    Medication  Adjustments/Labs and Tests Ordered: Current medicines are reviewed at length with the patient today.  Concerns regarding medicines are outlined above.  Orders Placed This Encounter  Procedures   MYOCARDIAL PERFUSION IMAGING   No orders of the defined types were placed in this encounter.   Patient Instructions  Medication Instructions:  The current medical regimen is effective;  continue present plan and medications.  *If you need a refill on your cardiac medications before your next appointment, please call your pharmacy*  Testing/Procedures: Your physician has requested that you have a myoview. For further information please visit [email or web address]. Please follow instruction sheet, as given.  Follow-Up: At [facility], you and your health needs are our priority.  As part of our continuing mission to provide you with exceptional heart care, we have created designated Provider Care Teams.  These Care Teams include your primary Cardiologist (physician) and Advanced Practice Providers (APPs -  Physician Assistants and Nurse Practitioners) who all work together to provide you with the care you need, when you need it.  We recommend signing up for the patient portal called "MyChart".  Sign up information is provided on this After Visit Summary.  MyChart is used to connect with patients for Virtual Visits (Telemedicine).  Patients are able to view lab/test results, encounter notes, upcoming appointments, etc.  Non-urgent messages can be sent to your provider as well.   To learn more about what you can do with MyChart, go to [email or web address].    Your next appointment:   Follow up will be based on the results of the above testing.   Signed, Candee Furbish, MD  01/22/2023 3:47 PM    [place]

## 2023-01-23 NOTE — Addendum Note (Signed)
Addended by: Ronie Spies on: 01/23/2023 03:07 PM   Modules accepted: Orders

## 2023-01-25 ENCOUNTER — Telehealth (HOSPITAL_COMMUNITY): Payer: Self-pay | Admitting: *Deleted

## 2023-01-25 NOTE — Telephone Encounter (Signed)
Patient given detailed instructions per Myocardial Perfusion Study Information Sheet for the test on 01/29/2023 at 7:30. Patient notified to arrive 15 minutes early and that it is imperative to arrive on time for appointment to keep from having the test rescheduled.  If you need to cancel or reschedule your appointment, please call the office within 24 hours of your appointment. . Patient verbalized understanding.Jason [name]

## 2023-01-29 ENCOUNTER — Other Ambulatory Visit (HOSPITAL_COMMUNITY): Payer: Self-pay

## 2023-01-29 ENCOUNTER — Ambulatory Visit (HOSPITAL_COMMUNITY): Payer: Self-pay | Attending: Internal Medicine

## 2023-01-29 DIAGNOSIS — R072 Precordial pain: Secondary | ICD-10-CM | POA: Insufficient documentation

## 2023-01-29 LAB — MYOCARDIAL PERFUSION IMAGING
Estimated workload: 60
Exercise duration (min): 1 min
Exercise duration (sec): 0 s
LV dias vol: 68 mL (ref 62–150)
LV sys vol: 31 mL
MPHR: 176 {beats}/min
Nuc Stress EF: 54 %
Peak HR: 106 {beats}/min
Percent HR: 60 %
Rest HR: 95 {beats}/min
Rest Nuclear Isotope Dose: 10.5 mCi
SDS: 0
SRS: 0
SSS: 0
ST Depression (mm): 0 mm
Stress Nuclear Isotope Dose: 31.2 mCi
TID: 0.9

## 2023-01-29 MED ORDER — REGADENOSON 0.4 MG/5ML IV SOLN
0.4000 mg | Freq: Once | INTRAVENOUS | Status: AC
  Administered 2023-01-29: 0.4 mg via INTRAVENOUS

## 2023-01-29 MED ORDER — TECHNETIUM TC 99M TETROFOSMIN IV KIT
31.2000 | PACK | Freq: Once | INTRAVENOUS | Status: AC | PRN
  Administered 2023-01-29: 31.2 via INTRAVENOUS

## 2023-01-29 MED ORDER — TECHNETIUM TC 99M TETROFOSMIN IV KIT
10.5000 | PACK | Freq: Once | INTRAVENOUS | Status: AC | PRN
  Administered 2023-01-29: 10.5 via INTRAVENOUS

## 2023-01-30 ENCOUNTER — Other Ambulatory Visit (HOSPITAL_COMMUNITY): Payer: Self-pay

## 2023-01-31 ENCOUNTER — Encounter (HOSPITAL_COMMUNITY): Payer: Self-pay

## 2023-02-12 ENCOUNTER — Encounter: Payer: Self-pay | Admitting: Student

## 2023-02-12 ENCOUNTER — Ambulatory Visit: Payer: Self-pay | Admitting: Student

## 2023-02-12 VITALS — BP 106/65 | HR 98 | Temp 97.7°F | Ht 63.0 in | Wt 141.8 lb

## 2023-02-12 DIAGNOSIS — I959 Hypotension, unspecified: Secondary | ICD-10-CM

## 2023-02-12 DIAGNOSIS — E785 Hyperlipidemia, unspecified: Secondary | ICD-10-CM

## 2023-02-12 DIAGNOSIS — Z0000 Encounter for general adult medical examination without abnormal findings: Secondary | ICD-10-CM

## 2023-02-12 DIAGNOSIS — E1140 Type 2 diabetes mellitus with diabetic neuropathy, unspecified: Secondary | ICD-10-CM

## 2023-02-12 DIAGNOSIS — Z794 Long term (current) use of insulin: Secondary | ICD-10-CM

## 2023-02-12 LAB — POCT GLYCOSYLATED HEMOGLOBIN (HGB A1C): Hemoglobin A1C: 11 % — AB (ref 4.0–5.6)

## 2023-02-12 LAB — GLUCOSE, CAPILLARY: Glucose-Capillary: 363 mg/dL — ABNORMAL HIGH (ref 70–99)

## 2023-02-12 MED ORDER — LIRAGLUTIDE 18 MG/3ML ~~LOC~~ SOPN
0.6000 mg | PEN_INJECTOR | Freq: Every day | SUBCUTANEOUS | 4 refills | Status: DC
  Filled 2023-02-12: qty 6, 60d supply, fill #0

## 2023-02-12 MED ORDER — NOVOLOG FLEXPEN 100 UNIT/ML ~~LOC~~ SOPN
10.0000 [IU] | PEN_INJECTOR | Freq: Two times a day (BID) | SUBCUTANEOUS | 2 refills | Status: DC
  Filled 2023-02-12: qty 15, 75d supply, fill #0
  Filled 2023-03-19: qty 6, 30d supply, fill #0
  Filled 2023-06-26: qty 6, 30d supply, fill #1
  Filled 2023-08-06 – 2023-08-07 (×2): qty 6, 30d supply, fill #2

## 2023-02-12 MED ORDER — LEVEMIR FLEXPEN 100 UNIT/ML ~~LOC~~ SOPN
30.0000 [IU] | PEN_INJECTOR | Freq: Every day | SUBCUTANEOUS | 11 refills | Status: DC
  Filled 2023-02-12: qty 15, 50d supply, fill #0
  Filled 2023-06-26: qty 9, 30d supply, fill #0
  Filled 2023-08-06: qty 9, 30d supply, fill #1

## 2023-02-12 NOTE — Patient Instructions (Addendum)
Jason [name], Jason [name] por permitirnos brindarle atencin hoy. hoy discutimos  Diabetes Por favor continen con levemir, victoza y novolog.   Por favor, controle sus niveles de azcar en sangre justo cuando se despierte. Por favor traiga su medidor a su prxima cita   mareos Si estos episodios ocurren con ms frecuencia, llmenos de inmediato. Por favor compre un manguito de presin arterial.    He ordenado los siguientes laboratorios para usted:   Lab Orders         Glucose, capillary         Lipid Profile         POC Hbg A1C      Pruebas ordenadas hoy:  Referencias ordenadas hoy:  Referral Orders  No referral(s) requested today     He ordenado el siguiente medicamento/cambiado los siguientes medicamentos:  Suspender los siguientes medicamentos: There are no discontinued medications.   Iniciar los siguientes medicamentos: No orders of the defined types were placed in this encounter.    Seguimiento  1 month       Si tiene [place] pregunta o inquietud, llame a la clnica de medicina interna al [phone].     Sanjuana Letters, D.O. [place]

## 2023-02-12 NOTE — Assessment & Plan Note (Addendum)
Assessment: Hx of insulin dependent type 2 DM. Medical regimen of levemir 30 U daily, novolog 10 U BID with meals, and victoza .6 mg daily. He has mistakenly been taking the victoza weekly rather than daily. Average glucose readings from glucometer are in the 200's with highest in the 300-400's. No episodes of hypoglycemia. He believes the novolog is the most helpful in regards to lowering his glucose levels.  Despite this, A1c has improved from 13.3% to 11.0%. He is not checking fasting glucose levels, we discussed starting to do this and can adjust his levemir based on this in 1 month. 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 Will continue victoza daily and instructed him on this. While he would do well with an SGLT-2i, believe this would lead to further symptomatic hypotension.   Diabetic foot exam without lesions, wounds, or calluses.   Plan: - continue levemir 30 U daily, novlog 10 U BID with meals, and victoza .6 mg daily - A1c in 3 months -

## 2023-02-12 NOTE — Progress Notes (Signed)
CC: follow up diabetes  HPI:  [name] is a 47 y.o. male living with a history stated below and presents today for diabetes. Please see problem based assessment and plan for additional details.  Past Medical History:  Diagnosis Date   Diabetes mellitus 03/2008   diagnosed in april when admitted to [facility] with blood glucose of 581. HbA1C was 9.9   DKA (diabetic ketoacidoses)    hospitalized in 03/10/08   Latent tuberculosis infection    PPD test measured 12 mm on 05/25/08. CXR on 06/03/08 showed no active disease or adenopathy. Started to take isoniazid 300 mg daily. on 06/11/08 and will  take medication  along with Vit  B6 for 9 months.   Rheumatoid arthritis(714.0)    managed by Dr. Bo Merino. sronegative. Dr. 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    Current Outpatient Medications on File Prior to Visit  Medication Sig Dispense Refill   Alcohol Swabs (ALCOHOL WIPES) 70 % PADS Please use an alcohol wipe to cleanse your skin before each insulin injection 100 each 0   Blood Glucose Monitoring Suppl (AGAMATRIX PRESTO PRO METER) DEVI 1 Units by Does not apply route 3 (three) times daily before meals. 1 Device 0   Continuous Blood Gluc Receiver (FREESTYLE LIBRE 2 READER) DEVI Use to continuously monitor blood sugars 1 each 1   Continuous Blood Gluc Sensor (FREESTYLE LIBRE 2 SENSOR) MISC Use to continuously monitor blood sugars 2 each 11   glucose blood test strip Use to test blood sugar three times daily 100 each 6   Insulin Pen Needle (UNIFINE PENTIPS) 32G X 4 MM MISC Use as directed with insulin three times daily. 100 each 2   Insulin Syringe-Needle U-100 (ULTICARE INSULIN SYRINGE) 31G X 1/4" 1 ML MISC Use twice daily for injecting insulin 100 each 4   Insulin Syringes, Disposable, U-100 1 ML MISC Use to inject insulin twice daily. IM program. 100 each 6   Microlet Lancets MISC Use 3 times daily before meals 100 each 6    moxifloxacin (VIGAMOX) 0.5 % ophthalmic solution Place 1 drop into the left eye 4 (four) times daily. Begin one day after surgery, continue as directed. 3 mL 5   ofloxacin (OCUFLOX) 0.3 % ophthalmic solution Place 1 drop into the right eye 4 (four) times daily. Begin one day prior to surgery, Continue as directed. 5 mL 5   ofloxacin (OCUFLOX) 0.3 % ophthalmic solution Place 1 drop into the right eye 4 (four) times daily. Begin one day prior to surgery. Continue as directed 5 mL 5   prednisoLONE acetate (PRED FORTE) 1 % ophthalmic suspension Place 1 drop into the right eye 4 (four) times daily. Begin one day after surgery, continue as directed 10 mL 5   prednisoLONE acetate (PRED FORTE) 1 % ophthalmic suspension Place 1 drop into the right eye 4 (four) times daily. Begin one day after surgery, continue as directed 10 mL 5   prednisoLONE acetate (PRED FORTE) 1 % ophthalmic suspension Place 1 drop into the left eye 4 (four) times daily. Begin one day after surgery, continue as directed. 5 mL 5   pregabalin (LYRICA) 75 MG capsule Take 1 capsule (75 mg total) by mouth 3 (three) times daily. 90 capsule 2   No current facility-administered medications on file prior to visit.   Review of Systems: ROS negative except for what is noted on the assessment and plan.  Vitals:  02/12/23 1035 02/12/23 1119 02/12/23 1121  BP: (!) 147/108 100/68 106/65  Pulse: 97 98   Temp: 97.7 F (36.5 C)    TempSrc: Oral    SpO2: 100%    Weight: 141 lb 12.8 oz (64.3 kg)    Height: 5\' 3"  (1.6 m)     Physical Exam: Constitutional: well-appearing, in no acute distress HENT: normocephalic atraumatic, mucous membranes moist Eyes: conjunctiva non-erythematous Neck: supple Cardiovascular: regular rate and rhythm, no m/r/g Pulmonary/Chest: normal work of breathing on room air MSK: normal bulk and tone Neurological: alert & oriented x 3, 5/5 strength in bilateral upper and lower extremities, normal gait Skin: warm and  dry. Feet without wounds or calluses  Psych: normal mood  Assessment & Plan:   Hypotension Assessment: Patient endorsed episodes of lightheadedness and feeling out of it 1-2 times a month for the last 5 months. Denies syncopal episodes. Denies similar episodes in the past. Etiologies likely orthostatic hypotension or hypoglycemic events. Orthostatics performed in the office and were as per below  Laying - 120/80 HR 97 Sitting - 98/62 HR 96 Standing - 87/58 HR 96 Standing 3 min - 95/58 HR of 92  He was asymptomatic during testing, but is not having an appropriate cardiac response to his hypotension. In the past he has been borderline hypertensive and normotensive. He is not currently prescribed any anti-hypertensive's or AV nodal blocking agents. Differential includes autonomic dysfunction secondary to his uncontrolled diabetes. Also included in this would be dehydration though would still expect some cardiac response. On exam he had a regular rate and rhythm without murmurs and his extremities were warm to touch. We discussed returning to clinic if symptoms occur more frequently. 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 Do not suspect secondary to infectious etiologies or shock present.   Plan: - continue to monitor, encourage hydration - return to clinic if symptoms persist or episodes increase, consider tx with fludrocortisone - instructed to purchase blood pressure cuff and record BP when episodes occur   Type 2 diabetes mellitus with diabetic neuropathy, with long-term current use of insulin (HCC) Assessment: Hx of insulin dependent type 2 DM. Medical regimen of levemir 30 U daily, novolog 10 U BID with meals, and victoza .6 mg daily. He has mistakenly been taking the victoza weekly rather than daily. Average glucose readings from glucometer are in the 200's with highest in the 300-400's. No  episodes of hypoglycemia. He believes the novolog is the most helpful in regards to lowering his glucose levels.  Despite this, A1c has improved from 13.3% to 11.0%. He is not checking fasting glucose levels, we discussed starting to do this and can adjust his levemir based on this in 1 month. 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 Will continue victoza daily and instructed him on this. While he would do well with an SGLT-2i, believe this would lead to further symptomatic hypotension.   Diabetic foot exam without lesions, wounds, or calluses.   Plan: - continue levemir 30 U daily, novlog 10 U BID with meals, and victoza .6 mg daily - A1c in 3 months -    Preventative health care Assessment: Encouraged to fill out orange card   Plan: - follow up completion go orange card application  Hyperlipidemia Needs repeat lipid panel at follow up visit  Patient discussed with Dr.  Newell Coral, D.O. [facility], PGY-3 Phone: [phone] Date 02/12/2023 Time 8:18 PM

## 2023-02-12 NOTE — Assessment & Plan Note (Signed)
Needs repeat lipid panel at follow up visit

## 2023-02-12 NOTE — Assessment & Plan Note (Signed)
Assessment: Encouraged to fill out orange card   Plan: - follow up completion go orange card application

## 2023-02-12 NOTE — Assessment & Plan Note (Addendum)
Assessment: Patient endorsed episodes of lightheadedness and feeling out of it 1-2 times a month for the last 5 months. Denies syncopal episodes. Denies similar episodes in the past. Etiologies likely orthostatic hypotension or hypoglycemic events. Orthostatics performed in the office and were as per below  Laying - 120/80 HR 97 Sitting - 98/62 HR 96 Standing - 87/58 HR 96 Standing 3 min - 95/58 HR of 92  He was asymptomatic during testing, but is not having an appropriate cardiac response to his hypotension. In the past he has been borderline hypertensive and normotensive. He is not currently prescribed any anti-hypertensive's or AV nodal blocking agents. Differential includes autonomic dysfunction secondary to his uncontrolled diabetes. Also included in this would be dehydration though would still expect some cardiac response. On exam he had a regular rate and rhythm without murmurs and his extremities were warm to touch. We discussed returning to clinic if symptoms occur more frequently. 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 Do not suspect secondary to infectious etiologies or shock present.   Plan: - continue to monitor, encourage hydration - return to clinic if symptoms persist or episodes increase, consider tx with fludrocortisone - instructed to purchase blood pressure cuff and record BP when episodes occur

## 2023-02-13 ENCOUNTER — Other Ambulatory Visit: Payer: Self-pay

## 2023-02-13 ENCOUNTER — Other Ambulatory Visit (HOSPITAL_COMMUNITY): Payer: Self-pay

## 2023-02-16 NOTE — Progress Notes (Signed)
Internal Medicine Clinic Attending  Case discussed with Dr. Johnney Ou  At the time of the visit.  We reviewed the resident's history and exam and pertinent patient test results.  I agree with the assessment, diagnosis, and plan of care documented in the resident's note. Please ensure at a future visit that pedal pulses and sensation have been assessed as part of diabetic foot exam.

## 2023-02-19 ENCOUNTER — Other Ambulatory Visit (HOSPITAL_COMMUNITY): Payer: Self-pay

## 2023-03-16 ENCOUNTER — Ambulatory Visit: Payer: Self-pay

## 2023-03-16 ENCOUNTER — Other Ambulatory Visit (HOSPITAL_COMMUNITY): Payer: Self-pay

## 2023-03-16 VITALS — BP 110/73 | HR 102 | Temp 98.2°F | Wt 141.5 lb

## 2023-03-16 DIAGNOSIS — E785 Hyperlipidemia, unspecified: Secondary | ICD-10-CM

## 2023-03-16 DIAGNOSIS — Z794 Long term (current) use of insulin: Secondary | ICD-10-CM

## 2023-03-16 DIAGNOSIS — I959 Hypotension, unspecified: Secondary | ICD-10-CM

## 2023-03-16 DIAGNOSIS — E1140 Type 2 diabetes mellitus with diabetic neuropathy, unspecified: Secondary | ICD-10-CM

## 2023-03-16 MED ORDER — ATORVASTATIN CALCIUM 20 MG PO TABS
20.0000 mg | ORAL_TABLET | Freq: Every day | ORAL | 11 refills | Status: DC
  Filled 2023-03-16: qty 30, 30d supply, fill #0

## 2023-03-16 MED ORDER — LIRAGLUTIDE 18 MG/3ML ~~LOC~~ SOPN
1.2000 mg | PEN_INJECTOR | Freq: Every day | SUBCUTANEOUS | 4 refills | Status: DC
  Filled 2023-03-16: qty 6, 30d supply, fill #0

## 2023-03-16 NOTE — Assessment & Plan Note (Addendum)
Patient presents for follow-up of recent visit for dizziness and lightheadedness.  He was found to have blunted cardiac response to hypotensive changes on orthostatic testing.  We encourage good hydration at that time.  He states that his symptoms have resolved.  Blood pressure stable at 110/73 today.  Most suspicious for autonomic dysfunction at this point. -Continue to monitor for return of symptoms

## 2023-03-16 NOTE — Progress Notes (Signed)
CC: diabetes  HPI:  Jason [name] is a 47 y.o. with past medical history as below who presents for follow up of his diabetes. See detailed assessment and plan for HPI.  Past Medical History:  Diagnosis Date   Diabetes mellitus 03/2008   diagnosed in april when admitted to [facility] with blood glucose of 581. HbA1C was 9.9   DKA (diabetic ketoacidoses)    hospitalized in 03/10/08   Latent tuberculosis infection    PPD test measured 12 mm on 05/25/08. CXR on 06/03/08 showed no active disease or adenopathy. Started to take isoniazid 300 mg daily. on 06/11/08 and will  take medication  along with Vit  B6 for 9 months.   Rheumatoid arthritis(714.0)    managed by Dr. Pollyann Savoy. sronegative. Dr. 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   Review of Systems:  See detailed assessment and plan for pertinent ROS.  Physical Exam:  Vitals:   03/16/23 1041  BP: 110/73  Pulse: (!) 102  Temp: 98.2 F (36.8 C)  TempSrc: Oral   Physical Exam Constitutional:      General: He is not in acute distress. HENT:     Head: Normocephalic and atraumatic.  Eyes:     Extraocular Movements: Extraocular movements intact.  Cardiovascular:     Rate and Rhythm: Normal rate and regular rhythm.     Heart sounds: No murmur heard. Pulmonary:     Effort: Pulmonary effort is normal.     Breath sounds: No wheezing, rhonchi or rales.  Musculoskeletal:     Cervical back: Neck supple.     Right lower leg: No edema.     Left lower leg: No edema.  Skin:    General: Skin is warm and dry.  Neurological:     Mental Status: He is alert and oriented to person, place, and time.  Psychiatric:        Mood and Affect: Mood normal.        Behavior: Behavior normal.    The 10-year ASCVD risk score (Arnett DK, et al., 2019) is: 2.7%   Values used to calculate the score:     Age: 10 years     Sex: Male     Is Non-Hispanic African American: No      Diabetic: Yes     Tobacco smoker: No     Systolic Blood Pressure: 110 mmHg     Is BP treated: No     HDL Cholesterol: 52 mg/dL     Total Cholesterol: 209 mg/dL  Assessment & Plan:   See Encounters Tab for problem based charting.  Hypotension Patient presents for follow-up of recent visit for dizziness and lightheadedness.  He was found to have blunted cardiac response to hypotensive changes on orthostatic testing.  We encourage good hydration at that time.  He states that his symptoms have resolved.  Blood pressure stable at 110/73 today.  Most suspicious for autonomic dysfunction at this point. -Continue to monitor for return of symptoms   Hyperlipidemia Patient presents with history of mixed hyperlipidemia.  Last lipid panel over a year ago.  He is not on a statin but would benefit for primary prevention of cardiovascular event given his diabetes. -Start Lipitor 20 mg daily -Repeat lipid panel in the future if obtains insurance (paperwork handed to him this visit)  Type 2 diabetes mellitus with diabetic neuropathy, with long-term current use of insulin [facility]) Patient presents with history  of uncontrolled diabetes.  He is currently on Levemir 30 units daily, NovoLog 10 units twice daily with meals, and Victoza 0.6 mg daily.  He forgot to bring his glucose meter today stating that he was in a hurry.  He reports good tolerance of his medications.  Endorses a couple hypoglycemic episodes which occurred because he took his mealtime insulin without eating soon thereafter.  Otherwise his sugars have remained in the high 200s, reaching as high as 300.  This is the case at fasting checks and also preprandial checks.  A1c at last visit 11.0% but this was only a month ago.  Currently avoiding SGLT2i given recent symptoms of hypotension. -Continue Levemir 30 units daily, NovoLog 10 units twice daily with meals, and increase Victoza to 1.2 mg daily -A1c check in 2 months  Patient discussed with Dr.  Mayford Knife

## 2023-03-16 NOTE — Assessment & Plan Note (Signed)
Patient presents with history of mixed hyperlipidemia.  Last lipid panel over a year ago.  He is not on a statin but would benefit for primary prevention of cardiovascular event given his diabetes. -Start Lipitor 20 mg daily -Repeat lipid panel in the future if obtains insurance (paperwork handed to him this visit)

## 2023-03-16 NOTE — Assessment & Plan Note (Addendum)
Patient presents with history of uncontrolled diabetes.  He is currently on Levemir 30 units daily, NovoLog 10 units twice daily with meals, and Victoza 0.6 mg daily.  He forgot to bring his glucose meter today stating that he was in a hurry.  He reports good tolerance of his medications.  Endorses a couple hypoglycemic episodes which occurred because he took his mealtime insulin without eating soon thereafter.  Otherwise his sugars have remained in the high 200s, reaching as high as 300.  This is the case at fasting checks and also preprandial checks.  A1c at last visit 11.0% but this was only a month ago.  Currently avoiding SGLT2i given recent symptoms of hypotension. -Continue Levemir 30 units daily, NovoLog 10 units twice daily with meals, and increase Victoza to 1.2 mg daily -A1c check in 2 months

## 2023-03-16 NOTE — Patient Instructions (Addendum)
Sr. Cinda Quest, fue un placer verlo hoy!  Hoy discutimos: Azcar en sangre: aumente Victoza a 1,2 mg al [employer]. Regreso a clnica en 2 meses. Colesterol: tome Lipitor 20 mg al da.  He ordenado los siguientes laboratorios hoy:  rdenes de laboratorio No se han solicitado pruebas de laboratorio hoy    Pruebas ordenadas hoy:  ninguno  Referencias ordenadas hoy:  rdenes de referencia No se solicitaron referencias hoy    He ordenado/cambiado los siguientes medicamentos:  Suspenda los siguientes medicamentos: Medicamentos discontinuados durante este encuentro Motivo de la medicacin  liraglutida (VICTOZA) 18 MG/3ML SOPN Reordenar    Inicie los siguientes medicamentos: Los mdicos ordenaron este encuentro Medicamentos  tableta de atorvastatina (LIPITOR) de 20 mg Sig: Tome 1 tableta (20 mg en total) por va oral al da. Dispensacin: 30 comprimidos Recarga: 11 programa de mensajera instantnea  liraglutida (VICTOZA) 18 MG/3ML SOPN Sig: Inyectar 1,2 mg en la piel diariamente. Dispensar: 6 mL Recarga: 4 programa de mensajera instantnea    Seguimiento: 2 meses  Asegrese de llegar 15 minutos antes de su prxima cita. Si llega tarde, es posible que le soliciten reprogramar su cita.  Esperamos verte la prxima vez. Llame a nuestra clnica al [phone] si tiene alguna pregunta o inquietud. El mejor momento para llamar es de lunes a viernes de 9 a. m. a 4 p. m., pero hay alguien disponible las 24 horas, los 7 [address]  [address] de la [place]. Si es fuera del horario de atencin o durante el fin de [place], llame al nmero principal del hospital y pregunte por el residente de guardia de medicina interna. Si necesita reabastecimiento de medicamentos, notifique a su farmacia con una semana de anticipacin y ellos nos enviarn una solicitud.  Gracias por permitirnos participar en su cuidado. Deseandote lo mejor!  Cammie Sickle, MD

## 2023-03-19 ENCOUNTER — Other Ambulatory Visit (HOSPITAL_COMMUNITY): Payer: Self-pay

## 2023-03-21 NOTE — Progress Notes (Signed)
Internal Medicine Clinic Attending  Case discussed with the resident at the time of the visit.  We reviewed the resident's history and exam and pertinent patient test results.  I agree with the assessment, diagnosis, and plan of care documented in the resident's note.  

## 2023-04-23 ENCOUNTER — Encounter: Payer: Self-pay | Admitting: Student

## 2023-05-16 ENCOUNTER — Encounter: Payer: Self-pay | Admitting: Student

## 2023-06-26 ENCOUNTER — Other Ambulatory Visit: Payer: Self-pay

## 2023-06-26 ENCOUNTER — Other Ambulatory Visit (HOSPITAL_COMMUNITY): Payer: Self-pay

## 2023-08-06 ENCOUNTER — Other Ambulatory Visit (HOSPITAL_COMMUNITY): Payer: Self-pay

## 2023-08-07 ENCOUNTER — Other Ambulatory Visit: Payer: Self-pay

## 2023-08-07 ENCOUNTER — Other Ambulatory Visit (HOSPITAL_COMMUNITY): Payer: Self-pay

## 2023-08-09 ENCOUNTER — Other Ambulatory Visit: Payer: Self-pay | Admitting: Student

## 2023-08-09 DIAGNOSIS — E1140 Type 2 diabetes mellitus with diabetic neuropathy, unspecified: Secondary | ICD-10-CM

## 2023-08-09 NOTE — Telephone Encounter (Signed)
insulin aspart (NOVOLOG FLEXPEN) 100 UNIT/ML FlexPen  insulin detemir (LEVEMIR FLEXPEN) 100 UNIT/ML FlexPen   [facility] [place]

## 2023-08-10 ENCOUNTER — Other Ambulatory Visit (HOSPITAL_COMMUNITY): Payer: Self-pay

## 2023-08-10 MED ORDER — NOVOLOG FLEXPEN 100 UNIT/ML ~~LOC~~ SOPN
10.0000 [IU] | PEN_INJECTOR | Freq: Two times a day (BID) | SUBCUTANEOUS | 2 refills | Status: DC
  Filled 2023-08-10: qty 15, 75d supply, fill #0

## 2023-08-13 ENCOUNTER — Telehealth: Payer: Self-pay

## 2023-08-13 NOTE — Telephone Encounter (Signed)
Novolog flexpen no longer on IM program.  Submitted application for NOVOLOG FLEXPEN to [employer] for patient assistance via online portal.   Phone: [phone]

## 2023-08-16 NOTE — Telephone Encounter (Signed)
Received notification from [employer] regarding approval for NOVOLOG FLEXPEN. Patient assistance approved from 08/15/23 to 08/08/24.  Medication will ship to [facility]  Pt ID: [id number]  Company phone: [phone]

## 2023-08-29 NOTE — Telephone Encounter (Signed)
INFORMED PATIENT HIS [employer] SHIPMENT IS AVAILABLE HERE AT THE OFFICE FOR PICKUP.   2 BOXES OF NOVOLOG AND 2 BOXES OF PEN NEEDLES ARE LABELED AND READY IN MED ROOM FRIDGE.

## 2023-09-24 ENCOUNTER — Encounter: Payer: Self-pay | Admitting: Student

## 2023-10-10 ENCOUNTER — Ambulatory Visit: Payer: Self-pay | Admitting: Student

## 2023-10-10 ENCOUNTER — Encounter: Payer: Self-pay | Admitting: Student

## 2023-10-10 ENCOUNTER — Other Ambulatory Visit (HOSPITAL_COMMUNITY): Payer: Self-pay

## 2023-10-10 ENCOUNTER — Telehealth: Payer: Self-pay

## 2023-10-10 VITALS — BP 110/76 | HR 92 | Temp 98.0°F | Ht 63.0 in | Wt 140.5 lb

## 2023-10-10 DIAGNOSIS — E1140 Type 2 diabetes mellitus with diabetic neuropathy, unspecified: Secondary | ICD-10-CM

## 2023-10-10 DIAGNOSIS — Z0000 Encounter for general adult medical examination without abnormal findings: Secondary | ICD-10-CM

## 2023-10-10 DIAGNOSIS — Z23 Encounter for immunization: Secondary | ICD-10-CM

## 2023-10-10 DIAGNOSIS — Z139 Encounter for screening, unspecified: Secondary | ICD-10-CM

## 2023-10-10 DIAGNOSIS — Z794 Long term (current) use of insulin: Secondary | ICD-10-CM

## 2023-10-10 LAB — BASIC METABOLIC PANEL
Anion gap: 10 (ref 5–15)
BUN: 19 mg/dL (ref 6–20)
CO2: 24 mmol/L (ref 22–32)
Calcium: 9.2 mg/dL (ref 8.9–10.3)
Chloride: 100 mmol/L (ref 98–111)
Creatinine, Ser: 0.66 mg/dL (ref 0.61–1.24)
GFR, Estimated: >60 mL/min (ref >60)
Glucose, Bld: 437 mg/dL — ABNORMAL HIGH (ref 70–99)
Potassium: 4 mmol/L (ref 3.5–5.1)
Sodium: 134 mmol/L — ABNORMAL LOW (ref 135–145)

## 2023-10-10 LAB — POCT URINALYSIS DIPSTICK
Bilirubin, UA: NEGATIVE
Blood, UA: NEGATIVE
Glucose, UA: POSITIVE — AB
Ketones, UA: NEGATIVE
Leukocytes, UA: NEGATIVE
Nitrite, UA: NEGATIVE
Protein, UA: NEGATIVE
Spec Grav, UA: 1.01 (ref 1.010–1.025)
Urobilinogen, UA: 0.2 U/dL
pH, UA: 6 (ref 5.0–8.0)

## 2023-10-10 LAB — POCT GLYCOSYLATED HEMOGLOBIN (HGB A1C): Hemoglobin A1C: 13.9 % — AB (ref 4.0–5.6)

## 2023-10-10 LAB — GLUCOSE, CAPILLARY: Glucose-Capillary: 429 mg/dL — ABNORMAL HIGH (ref 70–99)

## 2023-10-10 MED ORDER — GLUCOSE BLOOD VI STRP
1.0000 | ORAL_STRIP | Freq: Three times a day (TID) | 6 refills | Status: DC
  Filled 2023-10-10: qty 100, 30d supply, fill #0

## 2023-10-10 NOTE — Patient Instructions (Signed)
[name] por permitirnos ayudarle hoy. Durante esta visita hemos hablado acerca de los siguientes asuntos medicos:   -Diabetes -No recoja el Levemir -Comience a inyectar la insulina de larga duracion que se llama [place]. Esta es [employer]  -- 30 unidades por dia -Continue usando el Novolog 10 unidades dos veces al dia con las comidas mas grandes  - Le hemos empezado una aplicacion para receibir la insulina Tresiba asi comorecibe el Novolog por el programa de asistencia financiera. Esto sera procesado en las siguientes semanas. Necesita regresar aqui para recibir [employer] que el proceso es autorizado   - Hoy recibio la vacuna de influenza  -Recuerde llamar a Jason [name] para comenzar su aplicacion de la tarjeta naraja - asi le daran muchisima mas ayuda con citas medicas y servicios medicos necesarios.    He ordenado los siguientes exmenes de laboratorio. Lo llamare manana para darle los resultados y Administrator, arts.  Mantengase hidratado con agua, evite bebidas azucaradas y monitoree sus niveles de Chief Financial Officer   Lab Orders         Glucose, capillary         BMP w Anion Gap (STAT/Sunquest-performed on-site)         POC Hbg A1C         POCT Urinalysis Dipstick (81002)       Le he referido a los siguientes especialistas:   Referral Orders  No referral(s) requested today     Medicamentos:   NO continue tomando los siguientes medicamentos: Medications Discontinued During This Encounter  Medication Reason   insulin detemir (LEVEMIR FLEXPEN) 100 UNIT/ML FlexPen      EMPIECE a tomar los siguientes medicamentos: No orders of the defined types were placed in this encounter.    Cita de seguimiento: [employer]  Remember: Regrese para Media planner y Education officer, museum las otras muestras de [place]  Para contactarnos: Esperamos verte la prxima vez. Llame a nuestra clnica al telefono: [phone] si tiene alguna pregunta o inquietud. El  mejor horario para llamar es de lunes a viernes de 9 a. m. a 4 p. m. Si es fuera del horario de atencin o durante el fin de [place], llame al nmero principal del hospital y pregunte por el residente de guardia de medicina interna. Si necesita reposicin de medicamentos, por favor notifique a su farmacia con una semana de anticipacin y ellos nos enviarn una solicitud.  Gracias por confiar en nosotros para cuidar de usted!   Morene Crocker, MD [facility]

## 2023-10-10 NOTE — Progress Notes (Signed)
Pharmacy Medication Assistance Program Note    10/17/2023  Patient ID: Jason [name], male   DOB: 1976/02/12, 47 y.o.   MRN: [id number]     10/10/2023  Outreach Medication One  Manufacturer Medication One [employer] [facility] Evaristo Bury  Dose of Evaristo Bury u100  Type of Radiographer, therapeutic Assistance  Name of Prescriber Morene Crocker  Date Application Submitted to Manufacturer 10/10/2023  Method Application Sent to Manufacturer Fax  Patient Assistance Determination Approved  Approval Start Date 10/15/2023  Approval End Date 08/08/2024      [facility]

## 2023-10-12 DIAGNOSIS — Z139 Encounter for screening, unspecified: Secondary | ICD-10-CM | POA: Insufficient documentation

## 2023-10-12 NOTE — Assessment & Plan Note (Addendum)
Uncontrolled. Pateint was last seen in the office in the Spring when he had an A1c of 11. Since then, patient has qualified for Novolog assistance program which he obtained ~2 months ago. However, patient remains uninsured and has not been able to afford Lantus. In fact, he was trying to use his novolog as a long acting insulin and run out of the supply recently obtained from assistnce program.   Today's in office BG ~400. Patient was asymptomatic.  Lab Results  Component Value Date   HGBA1C 13.9 (A) 10/10/2023   Dipstick without ketonuria and STAT BMP without AGMA. He received Tresiba 30 units and Novolog 10 units while in the office. Pateint discharged home given no evidence of hyperglycemic crisis.  -Will transition to [place] 30 units from Levemir -Continue Novolog 10u BID -Assistance program for [place] filed today; Ms. Cooper Render aware -Received 5 pens of Tresiba and 5 pens of Novolog today -RTC in 1 month for follow up

## 2023-10-12 NOTE — Progress Notes (Signed)
Subjective:  CC: diabetes follow up  HPI:  [name] is a 47 y.o. male with a past medical history stated below and presents today for diabetes follow up. Please see problem based assessment and plan for additional details.  Past Medical History:  Diagnosis Date   Diabetes mellitus 03/2008   diagnosed in april when [facility] to [facility] with blood glucose of 581. HbA1C was 9.9   DKA (diabetic ketoacidoses)    hospitalized in 03/10/08   Latent tuberculosis infection    PPD test measured 12 mm on 05/25/08. CXR on 06/03/08 showed no active disease or adenopathy. Started to take isoniazid 300 mg daily. on 06/11/08 and will  take medication  along with Vit  B6 for 9 months.   Rheumatoid arthritis(714.0)    managed by Dr. Pollyann [name]. sronegative. Dr. 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    Current Outpatient Medications on File Prior to Visit  Medication Sig Dispense Refill   Alcohol Swabs (ALCOHOL WIPES) 70 % PADS Please use an alcohol wipe to cleanse your skin before each insulin injection 100 each 0   atorvastatin (LIPITOR) 20 MG tablet Take 1 tablet (20 mg total) by mouth daily. 30 tablet 11   Blood Glucose Monitoring Suppl (AGAMATRIX PRESTO PRO METER) DEVI 1 Units by Does not apply route 3 (three) times daily before meals. 1 Device 0   Continuous Blood Gluc Receiver (FREESTYLE LIBRE 2 READER) DEVI Use to continuously monitor blood sugars 1 each 1   Continuous Blood Gluc Sensor (FREESTYLE LIBRE 2 SENSOR) MISC Use to continuously monitor blood sugars 2 each 11   insulin aspart (NOVOLOG FLEXPEN) 100 UNIT/ML FlexPen Inject 10 Units into the skin 2 (two) times daily with a meal. 15 mL 2   Insulin Pen Needle (UNIFINE PENTIPS) 32G X 4 MM MISC Use as directed with insulin three times daily. 100 each 2   Insulin Syringe-Needle U-100 (ULTICARE INSULIN SYRINGE) 31G X 1/4" 1 ML MISC Use twice daily for injecting insulin 100 each 4    Insulin Syringes, Disposable, U-100 1 ML MISC Use to inject insulin twice daily. IM program. 100 each 6   liraglutide (VICTOZA) 18 MG/3ML SOPN Inject 1.2 mg into the skin daily. 6 mL 4   Microlet Lancets MISC Use 3 times daily before meals 100 each 6   moxifloxacin (VIGAMOX) 0.5 % ophthalmic solution Place 1 drop into the left eye 4 (four) times daily. Begin one day after surgery, continue as directed. 3 mL 5   ofloxacin (OCUFLOX) 0.3 % ophthalmic solution Place 1 drop into the right eye 4 (four) times daily. Begin one day prior to surgery, Continue as directed. 5 mL 5   ofloxacin (OCUFLOX) 0.3 % ophthalmic solution Place 1 drop into the right eye 4 (four) times daily. Begin one day prior to surgery. Continue as directed 5 mL 5   prednisoLONE acetate (PRED FORTE) 1 % ophthalmic suspension Place 1 drop into the right eye 4 (four) times daily. Begin one day after surgery, continue as directed 10 mL 5   prednisoLONE acetate (PRED FORTE) 1 % ophthalmic suspension Place 1 drop into the right eye 4 (four) times daily. Begin one day after surgery, continue as directed 10 mL 5   prednisoLONE acetate (PRED FORTE) 1 % ophthalmic suspension Place 1 drop into the left eye 4 (four) times daily. Begin one day after surgery, continue as directed. 5 mL 5  pregabalin (LYRICA) 75 MG capsule Take 1 capsule (75 mg total) by mouth 3 (three) times daily. 90 capsule 2   No current facility-administered medications on file prior to visit.    Family History  Problem Relation Age of Onset   Diabetes Cousin    Hypertension Cousin    Diabetes Mother     Social History   Socioeconomic History   Marital status: Single    Spouse name: Not on file   Number of children: 1   Years of education: 9   Highest education level: Not on file  Occupational History    Employer: [employer]  Tobacco Use   Smoking status: Never   Smokeless tobacco: Never  Substance and Sexual Activity   Alcohol use: No   Drug  use: No   Sexual activity: Not on file  Other Topics Concern   Not on file  Social History Narrative   Works Holiday representative jobs occasionally, 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   Social Determinants of Health   Financial Resource Strain: Not on file  Food Insecurity: Not on file  Transportation Needs: Not on file  Physical Activity: Not on file  Stress: Not on file  Social Connections: Not on file  Intimate Partner Violence: Not on file    Review of Systems: ROS negative except for what is noted on the assessment and plan.  Objective:   Vitals:   10/10/23 1600  BP: 110/76  Pulse: 92  Temp: 98 F (36.7 C)  TempSrc: Oral  SpO2: 99%  Weight: 140 lb 8 oz (63.7 kg)  Height: 5\' 3"  (1.6 m)    Physical Exam: Constitutional: well-appearing male sitting in chair, in no acute distress HENT: normocephalic atraumatic, mucous membranes moist Eyes: conjunctiva non-erythematous Neck: supple Cardiovascular: regular rate and rhythm, no m/r/g Pulmonary/Chest: normal work of breathing on room air, lungs clear to auscultation bilaterally Abdominal: soft, non-tender, non-distended MSK: normal bulk and tone Neurological: alert & oriented x 3 Skin: warm and dry Psych: Pleasant mood and affect       02/12/2023   10:37 AM  Depression screen PHQ 2/9  Decreased Interest 0  Down, Depressed, Hopeless 0  PHQ - 2 Score 0        No data to display           Assessment & Plan:   Type 2 diabetes mellitus with diabetic neuropathy, with long-term current use of insulin (HCC) Uncontrolled. Pateint was last seen in the office in the Spring when he had an A1c of 11. Since then, patient has qualified for Novolog assistance program which he obtained ~2 months ago. However, patient remains uninsured and has not been able to afford Lantus. In fact, he was trying to use his novolog as a long acting insulin and run out of the supply recently obtained from assistnce program.   Today's in office BG ~400.  Patient was asymptomatic.  Lab Results  Component Value Date   HGBA1C 13.9 (A) 10/10/2023   Dipstick without ketonuria and STAT BMP without AGMA. He received Tresiba 30 units and Novolog 10 units while in the office. Pateint discharged home given no evidence of hyperglycemic crisis.  -Will transition to [place] 30 units from Levemir -Continue Novolog 10u BID -Assistance program for [place] filed today; Ms. Cooper Render aware -Received 5 pens of Tresiba and 5 pens of Novolog today -RTC in 1 month for follow up  Encounter for screening involving social determinants of health (SDoH) -Received [employer] -Given  5 pen samples of Tresiba and 5 Novolog pens  Preventative health care Flu shot today   Follow up in 4-6 weeks for DM follow up  Patient discussed with Dr. Cleda Daub  Morene Crocker, MD [facility]  10/12/2023, 6:26 PM

## 2023-10-12 NOTE — Assessment & Plan Note (Signed)
Flu shot today

## 2023-10-12 NOTE — Assessment & Plan Note (Signed)
-  Received [employer] -Given 5 pen samples of Tresiba and 5 Novolog pens

## 2023-10-17 NOTE — Progress Notes (Signed)
Internal Medicine Clinic Attending  Case discussed with the resident at the time of the visit.  We reviewed the resident's history and exam and pertinent patient test results.  I agree with the assessment, diagnosis, and plan of care documented in the resident's note.  

## 2023-11-06 ENCOUNTER — Telehealth: Payer: Self-pay

## 2023-11-06 NOTE — Telephone Encounter (Signed)
Informed patient his novo nordisk shipment is ready for pickup here at the office.   3 boxes of Jason [name] are labeled and ready in med room fridge.

## 2023-12-03 NOTE — Telephone Encounter (Signed)
 Patient here to pick up 3 boxes of [place].  Medication was handed to patient.

## 2024-01-16 ENCOUNTER — Telehealth: Payer: Self-pay

## 2024-01-16 NOTE — Telephone Encounter (Addendum)
 Informed patient his novo nordisk shipment is ready for pickup    2 boxes of novolog  and 2 boxes of pen needles are labeled and ready in med room.

## 2024-01-23 NOTE — Telephone Encounter (Signed)
 Patient was given 2 boxes of Novolog and 2 boxes of pen needles.  This was all that was labeled in the refrigerator for patient.

## 2024-02-27 ENCOUNTER — Telehealth: Payer: Self-pay

## 2024-02-27 NOTE — Telephone Encounter (Addendum)
 Patients novo nordisk shipment of Tresiba pens has arrived and ready for pickup.   Will reach out to patient.  How much will he be taking daily? Last update was Nov 2024: 30 units every day.

## 2024-03-18 ENCOUNTER — Other Ambulatory Visit: Payer: Self-pay

## 2024-03-18 ENCOUNTER — Ambulatory Visit: Payer: Self-pay | Admitting: Student

## 2024-03-18 VITALS — BP 154/101 | HR 96 | Temp 98.4°F | Ht 63.0 in | Wt 140.9 lb

## 2024-03-18 DIAGNOSIS — Z794 Long term (current) use of insulin: Secondary | ICD-10-CM

## 2024-03-18 DIAGNOSIS — Z7985 Long-term (current) use of injectable non-insulin antidiabetic drugs: Secondary | ICD-10-CM

## 2024-03-18 DIAGNOSIS — E1140 Type 2 diabetes mellitus with diabetic neuropathy, unspecified: Secondary | ICD-10-CM

## 2024-03-18 DIAGNOSIS — E785 Hyperlipidemia, unspecified: Secondary | ICD-10-CM

## 2024-03-18 LAB — BASIC METABOLIC PANEL WITH GFR
Anion gap: 10 (ref 5–15)
BUN: 21 mg/dL — ABNORMAL HIGH (ref 6–20)
CO2: 23 mmol/L (ref 22–32)
Calcium: 9.2 mg/dL (ref 8.9–10.3)
Chloride: 100 mmol/L (ref 98–111)
Creatinine, Ser: 0.62 mg/dL (ref 0.61–1.24)
GFR, Estimated: >60 mL/min (ref >60)
Glucose, Bld: 311 mg/dL — ABNORMAL HIGH (ref 70–99)
Potassium: 3.9 mmol/L (ref 3.5–5.1)
Sodium: 133 mmol/L — ABNORMAL LOW (ref 135–145)

## 2024-03-18 LAB — POCT GLYCOSYLATED HEMOGLOBIN (HGB A1C): Hemoglobin A1C: 11.8 % — AB (ref 4.0–5.6)

## 2024-03-18 LAB — GLUCOSE, CAPILLARY: Glucose-Capillary: 330 mg/dL — ABNORMAL HIGH (ref 70–99)

## 2024-03-18 MED ORDER — TRULICITY 0.75 MG/0.5ML ~~LOC~~ SOAJ
0.7500 mg | SUBCUTANEOUS | 1 refills | Status: DC
  Filled 2024-03-18: qty 2, 28d supply, fill #0

## 2024-03-18 MED ORDER — INSULIN DEGLUDEC 100 UNIT/ML ~~LOC~~ SOPN
30.0000 [IU] | PEN_INJECTOR | Freq: Every day | SUBCUTANEOUS | 2 refills | Status: DC
  Filled 2024-03-18: qty 30, 100d supply, fill #0

## 2024-03-18 NOTE — Assessment & Plan Note (Addendum)
 Patient's A1c today 11.8%. CBG 311, stat BMP anion gap 10. Patient denies headache, abdominal pain, nausea/vomiting, low suspicion for DKA at this time. Discussed current regimen which includes Tresiba  30 units daily (patient has not done this for about a week), Novolog  10 units twice a day before meals (patient taking 15-18 units depending on his glucose levels, Victoza  1.2 mg injections, and Pregabalin  75 mg TID. Diabetic foot exam documented today, pedal pulses present bilaterally but sensation significantly decreased on both feet. Patient is endorsing fatigue and vision changes (history of surgery in both eyes), decreased sensation in fingertips and feet. Discussed these are changes associated with diabetes, and the importance of good glucose control.   Patient states he has been getting Tresiba  and Novolog  through patient assistance programs and picking up his medications when he gets a call. Does have gaps in time when one medication is not available. Never started Victoza  as this was never dispensed. Confirmed other cost effective treatments like metformin  have been stopped due to GI issues (not interested in restarting). Given elevated A1c, financial limitations, and need to get back on a consistent regimen, discussed starting Trulicity 0.75 mg weekly injections (available at no cost to patient through IM program at [facility] community care pharmacy location), taking Tresiba  30 units daily (3 boxes provided to patient today), and HOLDING off on the Novolog  (samples provided today) and the pregabalin  until his follow up on June 9th. Patient agrees to continue checking his blood glucose between now and then to help with medication adjustment decisions. He will call back the clinic if he has any issues with symptoms or medications.  Plan - RESTART Tresiba  30 units daily  - START Trulicity 0.75 mg injections ~6 weeks until follow up visit June 9th - HOLD off on Novolog  and pregabalin  for now, will assess  how patient is doing on two medications consistently before adding on more   - Microalbumin/Cr collected today, will follow up as needed

## 2024-03-18 NOTE — Assessment & Plan Note (Addendum)
 Patient's last lipid panel from 12/26/2021 with LDL 113. He was started on lipitor/atorvastatin  20 mg daily April 2024. Patient has been taking intermittently, denies adverse effects when he does take it. Not currently insured. Will focus on diabetes today and will plan on adding atorvastatin  back to patient's regimen. Will also discuss obtaining an updated lipid panel at that time if it is not too cost prohibitive.

## 2024-03-18 NOTE — Telephone Encounter (Addendum)
 Patient was given novo nordisk shipment of Tresiba . 30 units once daily.  For any changes, let me know and I will send dose changes to novo nordisk (no RX needed for the pharmacy, just keep on patients med list). He receives Tresiba  and Novolog  free from the company. These will continue to ship to the office.

## 2024-03-18 NOTE — Progress Notes (Addendum)
 Established Patient Office Visit  Subjective   Patient ID: Jason [name], male    DOB: [birth year]  Age: 48 y.o. MRN: [id number]  Chief Complaint  Patient presents with   Follow-up   Medication Refill   Patient is a 48 y.o. with a past medical history stated below who presents today for follow-up for diabetes. He is accompanied by a friend. Of note, he was unable to submit his application for [employer]. Discussed importance of submitting paperwork so he has coverage for labs and visits. Patient endorsed understanding. As a self-pay patient, special consideration was given to his ability to take all recommended medications consistently. Has been taking his diabetes and cholesterol medications intermittently as they are available through patient programs.   Patient made aware of move to new clinic location for his June appointment.   Please see problem based assessment and plan for additional details.     Past Medical History:  Diagnosis Date   Diabetes mellitus 03/2008   diagnosed in april when admitted to [facility] with blood glucose of 581. HbA1C was 9.9   DKA (diabetic ketoacidoses)    hospitalized in 03/10/08   Latent tuberculosis infection    PPD test measured 12 mm on 05/25/08. CXR on 06/03/08 showed no active disease or adenopathy. Started to take isoniazid 300 mg daily. on 06/11/08 and will  take medication  along with Vit  B6 for 9 months.   Rheumatoid arthritis(714.0)    managed by Dr. Nicholas Bari. sronegative. Dr. Alvira Josephs wanted to start him on Methotrxate but patient was found to be  PPD positive.    Review of Systems  Constitutional:  Positive for malaise/fatigue.  Respiratory:  Negative for cough and shortness of breath.   Cardiovascular:  Negative for chest pain, palpitations and leg swelling.  Gastrointestinal:  Negative for abdominal pain, constipation, diarrhea, nausea and vomiting.  Genitourinary:  Negative for dysuria.     Objective:     BP (!)  154/101 (BP Location: Right Arm, Patient Position: Sitting, Cuff Size: Normal)   Pulse 96   Temp 98.4 F (36.9 C) (Oral)   Ht 5\' 3"  (1.6 m)   Wt 140 lb 14.4 oz (63.9 kg)   SpO2 98%   BMI 24.96 kg/m  BP Readings from Last 3 Encounters:  03/18/24 (!) 154/101  10/10/23 110/76  03/16/23 110/73   Wt Readings from Last 3 Encounters:  03/18/24 140 lb 14.4 oz (63.9 kg)  10/10/23 140 lb 8 oz (63.7 kg)  03/16/23 141 lb 8 oz (64.2 kg)    Physical Exam HENT:     Head: Normocephalic and atraumatic.  Cardiovascular:     Rate and Rhythm: Normal rate and regular rhythm.     Heart sounds: Normal heart sounds.  Pulmonary:     Effort: Pulmonary effort is normal.     Breath sounds: Normal breath sounds.  Abdominal:     General: Bowel sounds are normal.     Palpations: Abdomen is soft.  Musculoskeletal:     Comments: Diabetic foot exam performed today. Patient's sensation decreased significantly on both lower extremities and feet. Pedal pulses present bilaterally.   Skin:    General: Skin is warm.  Neurological:     General: No focal deficit present.     Mental Status: He is alert.  Psychiatric:        Mood and Affect: Mood normal.     Results for orders placed or performed in visit on 03/18/24  BMP w Anion  Gap (STAT/Sunquest-performed on-site)  Result Value Ref Range   Sodium 133 (L) 135 - 145 mmol/L   Potassium 3.9 3.5 - 5.1 mmol/L   Chloride 100 98 - 111 mmol/L   CO2 23 22 - 32 mmol/L   Glucose, Bld 311 (H) 70 - 99 mg/dL   BUN 21 (H) 6 - 20 mg/dL   Creatinine, Ser 1.61 0.61 - 1.24 mg/dL   Calcium  9.2 8.9 - 10.3 mg/dL   GFR, Estimated >09 >60 mL/min   Anion gap 10 5 - 15  Glucose, capillary  Result Value Ref Range   Glucose-Capillary 330 (H) 70 - 99 mg/dL  POC Hbg A5W  Result Value Ref Range   Hemoglobin A1C 11.8 (A) 4.0 - 5.6 %   HbA1c POC (<> result, manual entry)     HbA1c, POC (prediabetic range)     HbA1c, POC (controlled diabetic range)     Last hemoglobin  A1c Lab Results  Component Value Date   HGBA1C 11.8 (A) 03/18/2024   The 10-year ASCVD risk score (Arnett DK, et al., 2019) is: 5.5%    Assessment & Plan:   Problem List Items Addressed This Visit     Hyperlipidemia (Chronic)   Patient's last lipid panel from 12/26/2021 with LDL 113. He was started on lipitor/atorvastatin  20 mg daily April 2024. Patient has been taking intermittently, denies adverse effects when he does take it. Not currently insured. Will focus on diabetes today and will plan on adding atorvastatin  back to patient's regimen. Will also discuss obtaining an updated lipid panel at that time if it is not too cost prohibitive.       Type 2 diabetes mellitus with diabetic neuropathy, with long-term current use of insulin  (HCC) - Primary   Patient's A1c today 11.8%. CBG 311, stat BMP anion gap 10. Patient denies headache, abdominal pain, nausea/vomiting, low suspicion for DKA at this time. Discussed current regimen which includes Tresiba  30 units daily (patient has not done this for about a week), Novolog  10 units twice a day before meals (patient taking 15-18 units depending on his glucose levels, Victoza  1.2 mg injections, and Pregabalin  75 mg TID. Diabetic foot exam documented today, pedal pulses present bilaterally but sensation significantly decreased on both feet. Patient is endorsing fatigue and vision changes (history of surgery in both eyes), decreased sensation in fingertips and feet. Discussed these are changes associated with diabetes, and the importance of good glucose control.   Patient states he has been getting Tresiba  and Novolog  through patient assistance programs and picking up his medications when he gets a call. Does have gaps in time when one medication is not available. Never started Victoza  as this was never dispensed. Confirmed other cost effective treatments like metformin  have been stopped due to GI issues (not interested in restarting). Given elevated A1c,  financial limitations, and need to get back on a consistent regimen, discussed starting Trulicity 0.75 mg weekly injections (available at no cost to patient through IM program at [facility] location), taking Tresiba  30 units daily (3 boxes provided to patient today), and HOLDING off on the Novolog  (samples provided today) and the pregabalin  until his follow up on June 9th. Patient agrees to continue checking his blood glucose between now and then to help with medication adjustment decisions. He will call back the clinic if he has any issues with symptoms or medications.  Plan - RESTART Tresiba  30 units daily  - START Trulicity 0.75 mg injections ~6 weeks until follow up  visit June 9th - HOLD off on Novolog  and pregabalin  for now, will assess how patient is doing on two medications consistently before adding on more   - Microalbumin/Cr collected today, will follow up as needed      Relevant Medications   Dulaglutide (TRULICITY) 0.75 MG/0.5ML SOAJ   insulin  degludec (TRESIBA ) 100 UNIT/ML FlexTouch Pen   Other Relevant Orders   POC Hbg A1C (Completed)   BMP w Anion Gap (STAT/Sunquest-performed on-site) (Completed)   Microalbumin / Creatinine Urine Ratio   Patient discussed with Dr. Lanetta Pion.  Return Round week of June 9th, for Diabetes management.   Lajuane Leatham Arellano Zameza, MD PGY-1, [facility]

## 2024-03-18 NOTE — Patient Instructions (Signed)
 Jason [name], Jason [name] por permitirnos brindarle atencin hoy. Hoy hablamos sobre su diabetes. Su A1c es 11.3%, cual mejoro comparado a 13.9% en Noviembre. La meta es de llegar a 7-8% cuidadosamente.   He ordenado los siguientes laboratorios para usted:   Lab Orders         BMP w Anion Gap (STAT/Sunquest-performed on-site)         Microalbumin / Creatinine Urine Ratio         Glucose, capillary         POC Hbg A1C      Pruebas ordenadas hoy:  Referencias ordenadas hoy:  Referral Orders  No referral(s) requested today     He ordenado el siguiente medicamento/cambiado los siguientes medicamentos:  Suspender los siguientes medicamentos: Medications Discontinued During This Encounter  Medication Reason   liraglutide  (VICTOZA ) 18 MG/3ML SOPN    insulin  aspart (NOVOLOG  FLEXPEN) 100 UNIT/ML FlexPen      Iniciar los siguientes medicamentos: Meds ordered this encounter  Medications   Dulaglutide (TRULICITY) 0.75 MG/0.5ML SOAJ    Sig: Inject 0.75 mg into the skin once a week.    Dispense:  2 mL    Refill:  1    IM Program. Please provide instructions in Spanish, thank you!   insulin  degludec (TRESIBA ) 100 UNIT/ML FlexTouch Pen    Sig: Inject 30 Units into the skin daily.    Dispense:  30 mL    Refill:  2    IM Program. If cost prohibitive do not fill, message provider at [facility]. Spanish instructions please.     Seguimiento **Please schedule with me week of June 9**   Recordar:   - TOMAR Tresiba  30 unidades en la [place], solo una vez al dia. Mida el azucar en ayunas en la manana y anote estos numeros. Hoy esta en los 300s.  - EMPEZAR Trulicity 0.75 mg, una injeccion que se va a poner una vez a la semana. Sigua las instrucciones que le den en la [place]. Si tiene preguntas sobre como ponerse el medicamento llame nuestra clinica y le podemos ensenar.  - DEJE de tomar Novolog  por ahora, vamos a hacerle pausa hasta que regrese a verme en junio.    Si tiene alguna pregunta  o inquietud, llame a la clnica de medicina interna al [phone].    Mansur Patti Arellano Zameza, MD PGY-1 Internal Medicine Teaching Program  [facility]

## 2024-03-19 LAB — MICROALBUMIN / CREATININE URINE RATIO
Creatinine, Urine: 66.9 mg/dL
Microalb/Creat Ratio: 270 mg/g{creat} — ABNORMAL HIGH (ref 0–29)
Microalbumin, Urine: 180.6 ug/mL

## 2024-03-22 NOTE — Progress Notes (Signed)
 Internal Medicine Clinic Attending  Case discussed with the resident at the time of the visit.  We reviewed the resident's history and exam and pertinent patient test results.  I agree with the assessment, diagnosis, and plan of care documented in the resident's note.

## 2024-04-23 ENCOUNTER — Telehealth: Payer: Self-pay

## 2024-04-23 NOTE — Telephone Encounter (Signed)
 In process of completing refill/ address change form for patients Tresiba  with [employer] [facility] PAP.   Will hold on Novolog  per providers orders.

## 2024-04-28 ENCOUNTER — Other Ambulatory Visit: Payer: Self-pay

## 2024-04-28 ENCOUNTER — Ambulatory Visit: Payer: Self-pay | Admitting: Student

## 2024-04-28 VITALS — BP 136/89 | HR 82 | Temp 98.2°F | Ht 63.0 in | Wt 146.0 lb

## 2024-04-28 DIAGNOSIS — Z139 Encounter for screening, unspecified: Secondary | ICD-10-CM

## 2024-04-28 DIAGNOSIS — Z7985 Long-term (current) use of injectable non-insulin antidiabetic drugs: Secondary | ICD-10-CM

## 2024-04-28 DIAGNOSIS — E1140 Type 2 diabetes mellitus with diabetic neuropathy, unspecified: Secondary | ICD-10-CM

## 2024-04-28 DIAGNOSIS — Z7984 Long term (current) use of oral hypoglycemic drugs: Secondary | ICD-10-CM

## 2024-04-28 DIAGNOSIS — Z794 Long term (current) use of insulin: Secondary | ICD-10-CM

## 2024-04-28 MED ORDER — EMPAGLIFLOZIN 10 MG PO TABS
10.0000 mg | ORAL_TABLET | Freq: Every day | ORAL | 3 refills | Status: DC
  Filled 2024-04-28: qty 30, 30d supply, fill #0

## 2024-04-28 MED ORDER — INSULIN DEGLUDEC 100 UNIT/ML ~~LOC~~ SOPN
34.0000 [IU] | PEN_INJECTOR | Freq: Every day | SUBCUTANEOUS | 2 refills | Status: DC
Start: 2024-04-28 — End: 2024-06-09
  Filled 2024-04-28: qty 30, 88d supply, fill #0

## 2024-04-28 NOTE — Assessment & Plan Note (Signed)
 The patient has consistently failed to complete the patient assistance form (previously called the 'Orange Card'). I emphasized the importance of submitting the form to obtain financial assistance, and he expressed understanding. - I have provided the contact number of  Mr.Carlos Mosquera, who can assist with filling the forms and answering his questions in Spanish.

## 2024-04-28 NOTE — Progress Notes (Signed)
 CC: Follow-up on type 2 diabetes.  HPI:  Jason [name] is a 48 y.o. male living with a history stated below and presents today for follow-up on type 2 diabetes.  Patient was last seen in IM resident clinic on 04/29  Please see problem based assessment and plan for additional details.  Medications Type 2 diabetes: Tresiba  30 units, Trulicity  0.75 mg.  Past Medical History:  Diagnosis Date   Diabetes mellitus 03/2008   diagnosed in april when admitted to [facility] with blood glucose of 581. HbA1C was 9.9   DKA (diabetic ketoacidoses)    hospitalized in 03/10/08   Latent tuberculosis infection    PPD test measured 12 mm on 05/25/08. CXR on 06/03/08 showed no active disease or adenopathy. Started to take isoniazid 300 mg daily. on 06/11/08 and will  take medication  along with Vit  B6 for 9 months.   Rheumatoid arthritis(714.0)    managed by Dr. Nicholas Bari. sronegative. Dr. Alvira Josephs wanted to start him on Methotrxate but patient was found to be  PPD positive.    Current Outpatient Medications on File Prior to Visit  Medication Sig Dispense Refill   Alcohol  Swabs  (ALCOHOL  WIPES) 70 % PADS Please use an alcohol  wipe to cleanse your skin before each insulin  injection 100 each 0   Blood Glucose Monitoring Suppl (AGAMATRIX PRESTO PRO METER) DEVI 1 Units by Does not apply route 3 (three) times daily before meals. 1 Device 0   Continuous Blood Gluc Receiver (FREESTYLE LIBRE 2 READER) DEVI Use to continuously monitor blood sugars 1 each 1   Continuous Blood Gluc Sensor (FREESTYLE LIBRE 2 SENSOR) MISC Use to continuously monitor blood sugars 2 each 11   Dulaglutide  (TRULICITY ) 0.75 MG/0.5ML SOAJ Inject 0.75 mg into the skin once a week. 2 mL 1   glucose blood test strip Use to test blood sugar three times daily 100 each 6   Insulin  Pen Needle (UNIFINE PENTIPS) 32G X 4 MM MISC Use as directed with insulin  three times daily. 100 each 2   Insulin  Syringe-Needle U-100 (ULTICARE  INSULIN  SYRINGE) 31G X 1/4" 1 ML MISC Use twice daily for injecting insulin  100 each 4   Insulin  Syringes, Disposable, U-100 1 ML MISC Use to inject insulin  twice daily. IM program. 100 each 6   Microlet Lancets MISC Use 3 times daily before meals 100 each 6   moxifloxacin  (VIGAMOX ) 0.5 % ophthalmic solution Place 1 drop into the left eye 4 (four) times daily. Begin one day after surgery, continue as directed. 3 mL 5   ofloxacin  (OCUFLOX ) 0.3 % ophthalmic solution Place 1 drop into the right eye 4 (four) times daily. Begin one day prior to surgery, Continue as directed. 5 mL 5   ofloxacin  (OCUFLOX ) 0.3 % ophthalmic solution Place 1 drop into the right eye 4 (four) times daily. Begin one day prior to surgery. Continue as directed 5 mL 5   prednisoLONE  acetate (PRED FORTE ) 1 % ophthalmic suspension Place 1 drop into the right eye 4 (four) times daily. Begin one day after surgery, continue as directed 10 mL 5   prednisoLONE  acetate (PRED FORTE ) 1 % ophthalmic suspension Place 1 drop into the right eye 4 (four) times daily. Begin one day after surgery, continue as directed 10 mL 5   prednisoLONE  acetate (PRED FORTE ) 1 % ophthalmic suspension Place 1 drop into the left eye 4 (four) times daily. Begin one day after surgery, continue as directed. 5 mL 5   No current  facility-administered medications on file prior to visit.   Review of Systems: ROS negative except for what is noted on the assessment and plan.  Vitals:   04/28/24 0936 04/28/24 1019  BP: (!) 148/97 136/89  Pulse: 92 82  Temp: 98.2 F (36.8 C)   TempSrc: Oral   SpO2: 98%   Weight: 146 lb (66.2 kg)   Height: 5\' 3"  (1.6 m)     Last hemoglobin A1c Lab Results  Component Value Date   HGBA1C 11.8 (A) 03/18/2024     BP Readings from Last 3 Encounters:  04/28/24 136/89  03/18/24 (!) 154/101  10/10/23 110/76    Physical Exam: Constitutional: NAD. Cardiovascular: regular rate and rhythm, no m/r/g Pulmonary/Chest: Clear lungs,  no wheezes. Abdominal: soft, non-tender, non-distended  Assessment & Plan:   Patient discussed with Dr. Jarvis Mesa  Type 2 diabetes mellitus with diabetic neuropathy, with long-term current use of insulin  (HCC) A1c one month ago was 11.8%. Home regimen includes Tresiba  30 units daily. Patient previously trialed metformin  but discontinued due to GI intolerance. Trulicity  was prescribed but not initiated due to lack of understanding of administration.  Reviewed glucose monitor data: average glucose ~240 mg/dL, with >16% of readings >250 mg/dL.  At last visit, urine microalbumin/creatinine ratio indicated moderately increased albuminuria. No contraindications to initiating SGLT2 inhibitors (no history of UTI). Diabetes educator, Ms. Abe Abed, demonstrated Trulicity  injection technique with interpreter assistance.  Patient is uninsured, I will send all his medications to the [employer], as he can obtain his medicines free of charge.  Plan: -Initiate Jardiance 10 mg daily for glycemic control and albuminuria. - Begin Trulicity  0.75 mg weekly. - Increase Tresiba  to 34 units daily. -Sent prescription to [facility], where the patient can obtain medications free of charge due to lack of insurance    Encounter for screening involving social determinants of health [facility]) The patient has consistently failed to complete the patient assistance form (previously called the 'Orange Card'). I emphasized the importance of submitting the form to obtain financial assistance, and he expressed understanding. - I have provided the contact number of  Mr.Carlos Mosquera, who can assist with filling the forms and answering his questions in Spanish.   Marni Sins, MD [facility] Internal Medicine, PGY-1  Date 04/28/2024 Time 4:35 PM

## 2024-04-28 NOTE — Assessment & Plan Note (Signed)
 A1c one month ago was 11.8%. Home regimen includes Tresiba  30 units daily. Patient previously trialed metformin  but discontinued due to GI intolerance. Trulicity  was prescribed but not initiated due to lack of understanding of administration.  Reviewed glucose monitor data: average glucose ~240 mg/dL, with >16% of readings >250 mg/dL.  At last visit, urine microalbumin/creatinine ratio indicated moderately increased albuminuria. No contraindications to initiating SGLT2 inhibitors (no history of UTI). Diabetes educator, Ms. Abe Abed, demonstrated Trulicity  injection technique with interpreter assistance.  Patient is uninsured, I will send all his medications to the [employer], as he can obtain his medicines free of charge.  Plan: -Initiate Jardiance 10 mg daily for glycemic control and albuminuria. - Begin Trulicity  0.75 mg weekly. - Increase Tresiba  to 34 units daily. -Sent prescription to [facility], where the patient can obtain medications free of charge due to lack of insurance

## 2024-04-28 NOTE — Patient Instructions (Addendum)
  Fue un placer atenderle hoy!  Para la diabetes:  Aumentar Tresiba  a 34 unidades diarias.  Comenzar la inyeccin semanal de Trulicity .  Tomar 1 tableta de Jardiance 10 mg diaria.  Por favor, contine monitoreando sus niveles de azcar diariamente.  Por favor, complete la tarjeta de asistencia financiera y envela a la direccin que aparece en el formulario. Si necesita ayuda para llenar el formulario, Jason [name] puede asistirle. Esta es la direccin de su oficina: [address]. Nmero de telfono: [phone].    rdenes de laboratorio No se ordenaron pruebas de laboratorio hoy.  Seguimiento: 4 semanas  Si tiene [place] pregunta o inquietud, por favor llame a la clnica de medicina interna al [phone].  Jason Sins, MD [facility]

## 2024-04-29 ENCOUNTER — Encounter: Payer: Self-pay | Admitting: *Deleted

## 2024-04-29 ENCOUNTER — Telehealth: Payer: Self-pay

## 2024-04-29 NOTE — Progress Notes (Signed)
 Internal Medicine Clinic Attending  Case discussed with the resident at the time of the visit.  We reviewed the resident's history and exam and pertinent patient test results.  I agree with the assessment, diagnosis, and plan of care documented in the resident's note.

## 2024-04-29 NOTE — Progress Notes (Unsigned)
 Pharmacy Medication Assistance Program Note    05/13/2024  Patient ID: Jason [name], male  DOB: [birth year], 48 y.o.  MRN:  [id number]     04/29/2024  Outreach Medication Two  Manufacturer Medication Two [employer]  [employer] Drugs Jardiance   Dose of Jardiance  10MG   Type of Sport and exercise psychologist  Date Application Sent to Prescriber 04/30/2024  Name of Prescriber RED TEAMS BOX  Date Application Received From Patient 04/28/2024  Application Items Received From Patient Application  Date Application Received From Provider 05/07/2024  Method Application Sent to Manufacturer Fax  Date Application Submitted to Manufacturer 05/13/2024    NEW - SUBMITTED

## 2024-04-30 NOTE — Telephone Encounter (Signed)
 Faxed completed form to [employer].   Form scanned to patients media.

## 2024-05-05 ENCOUNTER — Other Ambulatory Visit: Payer: Self-pay

## 2024-05-15 NOTE — Progress Notes (Signed)
 Pharmacy Medication Assistance Program Note    05/15/2024  Patient ID: Jason [name], male  DOB: 1975/12/21, 48 y.o.  MRN:  [id number]     04/29/2024  Outreach Medication Two  Manufacturer Medication Two [employer]  [employer] Drugs Jardiance   Dose of Jardiance  10MG   Type of Sport and exercise psychologist  Date Application Sent to Prescriber 04/30/2024  Name of Prescriber Libby Blanch  Date Application Received From Patient 04/28/2024  Application Items Received From Patient Application  Date Application Received From Provider 05/07/2024  Method Application Sent to Manufacturer Fax  Date Application Submitted to Manufacturer 05/13/2024  Patient Assistance Determination Approved  Approval Start Date 05/14/2024     NEW - APPROVED. MEDICATION WILL SHIP TO PATIENTS HOME.

## 2024-05-26 ENCOUNTER — Encounter: Payer: Self-pay | Admitting: Student

## 2024-05-26 NOTE — Progress Notes (Deleted)
   Established Patient Office Visit  Subjective   Patient ID: Jason [name], male    DOB: 1976/05/14  Age: 48 y.o. MRN: [id number]  No chief complaint on file.   HPI This is a 48 year old male with past medical history of type 2 diabetes with diabetic nephropathy and long-term current insulin  use who presents today following up on diabetes.  Last office visit 04/28/2024.   ROS   As per assessment and plan Objective:     There were no vitals taken for this visit. BP Readings from Last 3 Encounters:  04/28/24 136/89  03/18/24 (!) 154/101  10/10/23 110/76   Wt Readings from Last 3 Encounters:  04/28/24 146 lb (66.2 kg)  03/18/24 140 lb 14.4 oz (63.9 kg)  10/10/23 140 lb 8 oz (63.7 kg)      Physical Exam   No results found for any visits on 05/26/24.  Last metabolic panel Lab Results  Component Value Date   GLUCOSE 311 (H) 03/18/2024   NA 133 (L) 03/18/2024   K 3.9 03/18/2024   CL 100 03/18/2024   CO2 23 03/18/2024   BUN 21 (H) 03/18/2024   CREATININE 0.62 03/18/2024   GFRNONAA >60 03/18/2024   CALCIUM  9.2 03/18/2024   PHOS 2.9 03/11/2008   PROT 8.0 03/09/2022   ALBUMIN 4.9 03/09/2022   LABGLOB 2.6 12/12/2021   AGRATIO 1.8 12/12/2021   BILITOT 0.3 03/09/2022   ALKPHOS 101 03/09/2022   AST 24 03/09/2022   ALT 21 03/09/2022   ANIONGAP 10 03/18/2024   Last hemoglobin A1c Lab Results  Component Value Date   HGBA1C 11.8 (A) 03/18/2024     Lab Results  Component Value Date   LABMICR 180.6 03/18/2024   LABMICR 604.3 12/14/2022   MICROALBUR 4.8 (H) 02/26/2015   MICROALBUR 4.35 (H) 03/21/2013       The 10-year ASCVD risk score (Arnett DK, et al., 2019) is: 4.4%    Assessment & Plan:  Type 2 diabetes with diabetic nephropathy/long-term current insulin  use  -Current outpatient medication regimen includes Jardiance  10 mg daily, Trulicity  0.75 mg weekly, Tresiba  34 units daily. -Glucose monitor data: -Provided/encouraged patient assistance  program Problem List Items Addressed This Visit   None   No follow-ups on file.    Toma Edwards, DO

## 2024-06-02 ENCOUNTER — Encounter: Payer: Self-pay | Admitting: Student

## 2024-06-02 ENCOUNTER — Ambulatory Visit: Payer: Self-pay

## 2024-06-02 NOTE — Progress Notes (Deleted)
   Established Patient Office Visit  Subjective   Patient ID: Jason [name], male    DOB: 1976-02-26  Age: 48 y.o. MRN: [id number]  No chief complaint on file.   HPI This is a 48 year old male with past medical history of type 2 diabetes with diabetic nephropathy and long-term current insulin  use who presents today following up on diabetes.  Last office visit 04/28/2024.   ROS   As per assessment and plan Objective:     There were no vitals taken for this visit. BP Readings from Last 3 Encounters:  04/28/24 136/89  03/18/24 (!) 154/101  10/10/23 110/76   Wt Readings from Last 3 Encounters:  04/28/24 146 lb (66.2 kg)  03/18/24 140 lb 14.4 oz (63.9 kg)  10/10/23 140 lb 8 oz (63.7 kg)      Physical Exam   No results found for any visits on 06/02/24.  Last metabolic panel Lab Results  Component Value Date   GLUCOSE 311 (H) 03/18/2024   NA 133 (L) 03/18/2024   K 3.9 03/18/2024   CL 100 03/18/2024   CO2 23 03/18/2024   BUN 21 (H) 03/18/2024   CREATININE 0.62 03/18/2024   GFRNONAA >60 03/18/2024   CALCIUM  9.2 03/18/2024   PHOS 2.9 03/11/2008   PROT 8.0 03/09/2022   ALBUMIN 4.9 03/09/2022   LABGLOB 2.6 12/12/2021   AGRATIO 1.8 12/12/2021   BILITOT 0.3 03/09/2022   ALKPHOS 101 03/09/2022   AST 24 03/09/2022   ALT 21 03/09/2022   ANIONGAP 10 03/18/2024   Last hemoglobin A1c Lab Results  Component Value Date   HGBA1C 11.8 (A) 03/18/2024     Lab Results  Component Value Date   LABMICR 180.6 03/18/2024   LABMICR 604.3 12/14/2022   MICROALBUR 4.8 (H) 02/26/2015   MICROALBUR 4.35 (H) 03/21/2013       The 10-year ASCVD risk score (Arnett DK, et al., 2019) is: 4.4%    Assessment & Plan:  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 -Glucose monitor data: -Provided/encouraged patient assistance program  Problem List Items Addressed This Visit   None   No follow-ups on file.    Toma Edwards, DO

## 2024-06-02 NOTE — Telephone Encounter (Signed)
 FYI Only or Action Required?: FYI only for provider.  Patient was last seen in primary care on 04/28/2024 by Celestina Czar, MD.  Called Nurse Triage reporting reschedule apt.  (Called to reschedule apt for today. ).  Symptoms began today.  Interventions attempted: Nothing.  Symptoms are: unchanged.  Triage Disposition: No disposition on file.  Patient/caregiver understands and will follow disposition?:   Declined triage; wanted to cancel and reschedule apt that he had today.   Copied from CRM [id number]. Topic: Clinical - Red Word Triage >> Jun 02, 2024  8:04 AM Zane F wrote: Red Word that prompted transfer to Nurse Triage:   Congestion, coughing, sneezing  Symptoms started two days ago

## 2024-06-02 NOTE — Telephone Encounter (Signed)
 Pt has an appt 06/09/24.

## 2024-06-09 ENCOUNTER — Ambulatory Visit: Payer: Self-pay | Admitting: Student

## 2024-06-09 ENCOUNTER — Other Ambulatory Visit: Payer: Self-pay

## 2024-06-09 VITALS — BP 171/107 | HR 88 | Wt 145.4 lb

## 2024-06-09 DIAGNOSIS — Z7984 Long term (current) use of oral hypoglycemic drugs: Secondary | ICD-10-CM

## 2024-06-09 DIAGNOSIS — I10 Essential (primary) hypertension: Secondary | ICD-10-CM

## 2024-06-09 DIAGNOSIS — Z794 Long term (current) use of insulin: Secondary | ICD-10-CM

## 2024-06-09 DIAGNOSIS — E1140 Type 2 diabetes mellitus with diabetic neuropathy, unspecified: Secondary | ICD-10-CM

## 2024-06-09 DIAGNOSIS — Z7985 Long-term (current) use of injectable non-insulin antidiabetic drugs: Secondary | ICD-10-CM

## 2024-06-09 LAB — POCT GLYCOSYLATED HEMOGLOBIN (HGB A1C): Hemoglobin A1C: 8.8 % — AB (ref 4.0–5.6)

## 2024-06-09 LAB — GLUCOSE, CAPILLARY: Glucose-Capillary: 169 mg/dL — ABNORMAL HIGH (ref 70–99)

## 2024-06-09 MED ORDER — LOSARTAN POTASSIUM 50 MG PO TABS
50.0000 mg | ORAL_TABLET | Freq: Every day | ORAL | 2 refills | Status: DC
  Filled 2024-06-09: qty 90, 90d supply, fill #0
  Filled 2024-09-09: qty 90, 90d supply, fill #1

## 2024-06-09 MED ORDER — INSULIN DEGLUDEC 100 UNIT/ML ~~LOC~~ SOPN
34.0000 [IU] | PEN_INJECTOR | Freq: Every day | SUBCUTANEOUS | 2 refills | Status: DC
  Filled 2024-06-09 – 2024-09-29 (×2): qty 30, 88d supply, fill #0

## 2024-06-09 MED ORDER — TRUE METRIX METER W/DEVICE KIT
PACK | 6 refills | Status: DC
  Filled 2024-06-09: qty 1, 30d supply, fill #0

## 2024-06-09 MED ORDER — GLUCOSE BLOOD VI STRP
1.0000 | ORAL_STRIP | Freq: Three times a day (TID) | 6 refills | Status: DC
  Filled 2024-06-09: qty 100, 34d supply, fill #0

## 2024-06-09 MED ORDER — TRULICITY 0.75 MG/0.5ML ~~LOC~~ SOAJ
0.7500 mg | SUBCUTANEOUS | 4 refills | Status: DC
Start: 2024-06-09 — End: 2024-10-01
  Filled 2024-06-09: qty 2, 28d supply, fill #0
  Filled 2024-09-09: qty 2, 28d supply, fill #1

## 2024-06-09 MED ORDER — TRUEPLUS LANCETS 28G MISC
12 refills | Status: DC
  Filled 2024-06-09: qty 100, 30d supply, fill #0

## 2024-06-09 NOTE — Progress Notes (Signed)
 Established Patient Office Visit  Subjective   Patient ID: Jason [name], male    DOB: 23-Dec-1975  Age: 48 y.o. MRN: [id number]  No chief complaint on file.   [name] is a 48 y.o.  past medical history of type 2 diabetes, hyperlipidemia presents today for diabetes F/u.  Last office visit 04/28/2024.  Review of Systems:  As per assessment and Plan   Objective:     Vitals:   06/09/24 0855 06/09/24 0945  BP: (!) 169/98 (!) 171/107  Pulse: 91 88  SpO2: 99%   Weight: 145 lb 6.4 oz (66 kg)     Physical Exam General: Sitting in chair, no acute distress Cardiovascular: Regular rate Pulmonary: Breathing comfortably Abdomen: Soft, nontender, nondistended MSK: No lower extremity edema bilaterally  Last metabolic panel Lab Results  Component Value Date   GLUCOSE 311 (H) 03/18/2024   NA 133 (L) 03/18/2024   K 3.9 03/18/2024   CL 100 03/18/2024   CO2 23 03/18/2024   BUN 21 (H) 03/18/2024   CREATININE 0.62 03/18/2024   GFRNONAA >60 03/18/2024   CALCIUM  9.2 03/18/2024   PHOS 2.9 03/11/2008   PROT 8.0 03/09/2022   ALBUMIN 4.9 03/09/2022   LABGLOB 2.6 12/12/2021   AGRATIO 1.8 12/12/2021   BILITOT 0.3 03/09/2022   ALKPHOS 101 03/09/2022   AST 24 03/09/2022   ALT 21 03/09/2022   ANIONGAP 10 03/18/2024   Last lipids Lab Results  Component Value Date   CHOL 209 (H) 12/26/2021   HDL 52 12/26/2021   LDLCALC 113 (H) 12/26/2021   TRIG 257 (H) 12/26/2021   CHOLHDL 4.0 12/26/2021   Last hemoglobin A1c Lab Results  Component Value Date   HGBA1C 8.8 (A) 06/09/2024     Lab Results  Component Value Date   LABMICR 180.6 03/18/2024   LABMICR 604.3 12/14/2022   MICROALBUR 4.8 (H) 02/26/2015   MICROALBUR 4.35 (H) 03/21/2013      The 10-year ASCVD risk score (Arnett DK, et al., 2019) is: 7.7%    Assessment & Plan:   Patient discussed with Dr. Lovie  Problem List Items Addressed This Visit       Cardiovascular and Mediastinum   Essential  hypertension (Chronic)   BP Readings from Last 3 Encounters:  06/09/24 (!) 169/98  04/28/24 136/89  03/18/24 (!) 154/101   Blood pressure elevated during OV, 169/98. Currently not on any antihypertensive medications.  Patient does have history of proteinuria and DM.  - Start Losartan  50 mg daily, DOH List at [facility]  - Will need BMP check at next OV      Relevant Medications   losartan  (COZAAR ) 50 MG tablet     Endocrine   Type 2 diabetes mellitus with diabetic neuropathy, with long-term current use of insulin  (HCC) - Primary   Last A1c 11.8, checked on 03/18/2024.  A1c today 8.8 today, improved. Per the last office visit, Tresiba  was increased to 34 units daily, patient was started on Trulicity  0.75 mg weekly and started on Jardiance  10 mg daily. Today, patient reports that he has been out of the Trulicity  IM injection for several months. He is taking Tresiba  34 U daily and Jardiance  daily. Reports that he checked his blood sugar this morning and it was 140s; reports that he needs refills on medications and needs test strips.  - Refills are sent fro Trulicity  and Tresiba  to the [facility] pharmacy (Trulicity  under [facility] list).  - Patient is advised to call for refills or reach out  to the pharmacy for refills. - Continue Jardiance  10 mg daily -Patient is encouraged to complete the final assistance paperwork      Relevant Medications   losartan  (COZAAR ) 50 MG tablet   insulin  degludec (TRESIBA ) 100 UNIT/ML FlexTouch Pen   Dulaglutide  (TRULICITY ) 0.75 MG/0.5ML SOAJ   glucose blood test strip   Other Relevant Orders   POC Hbg A1C (Completed)   Glucose, capillary (Completed)    Return in about 1 month (around 07/10/2024) for HTN and medication review.    Toma Edwards, DO

## 2024-06-09 NOTE — Progress Notes (Deleted)
   Acute Office Visit  Subjective:     Patient ID: Kahleel Fadeley, male    DOB: 1976-07-21, 48 y.o.   MRN: [id number]  No chief complaint on file.   HPI This is a 48 year old male with past medical history of type 2 diabetes, hyperlipidemia presents today for an acute visit for cough.  Last office visit 04/28/2024.  ROS   As per assessment and plan Objective:    There were no vitals taken for this visit. BP Readings from Last 3 Encounters:  04/28/24 136/89  03/18/24 (!) 154/101  10/10/23 110/76   Wt Readings from Last 3 Encounters:  04/28/24 146 lb (66.2 kg)  03/18/24 140 lb 14.4 oz (63.9 kg)  10/10/23 140 lb 8 oz (63.7 kg)      Physical Exam  No results found for any visits on 06/09/24.      Assessment & Plan:  Cough  Problem List Items Addressed This Visit   None   No orders of the defined types were placed in this encounter.   No follow-ups on file.  Toma Edwards, DO

## 2024-06-09 NOTE — Assessment & Plan Note (Signed)
 BP Readings from Last 3 Encounters:  06/09/24 (!) 169/98  04/28/24 136/89  03/18/24 (!) 154/101   Blood pressure elevated during OV, 169/98. Currently not on any antihypertensive medications.  Patient does have history of proteinuria and DM.  - Start Losartan  50 mg daily, DOH List at [facility]  - Will need BMP check at next OV

## 2024-06-09 NOTE — Addendum Note (Signed)
 Addended by: HEDDY BARREN on: 06/09/2024 10:04 AM   Modules accepted: Orders

## 2024-06-09 NOTE — Patient Instructions (Addendum)
 Gracias, Sr. [place], por permitirnos atenderle hoy. Hoy hablamos sobre:  Para su diabetes - Tome Trulicity  0.75 mg, 1 inyeccin una vez a la semana - Tome Tresiba  34 unidades al da - T[place] Jardiance  1 tableta al da  Para su presin arterial - Comience con losartn 50 mg, 1 tableta por va oral al [employer] su presin arterial diariamente I have ordered the following labs for you:   Lab Orders         Glucose, capillary         POC Hbg A1C      Tests ordered today:  As above  Referrals ordered today:   Referral Orders  No referral(s) requested today     I have ordered the following medication/changed the following medications:   Stop the following medications: Medications Discontinued During This Encounter  Medication Reason   Dulaglutide  (TRULICITY ) 0.75 MG/0.5ML SOAJ Reorder   insulin  degludec (TRESIBA ) 100 UNIT/ML FlexTouch Pen Reorder   glucose blood test strip Reorder     Start the following medications: Meds ordered this encounter  Medications   losartan  (COZAAR ) 50 MG tablet    Sig: Take 1 tablet (50 mg total) by mouth daily.    Dispense:  90 tablet    Refill:  2    DOH list, no coverage. Please provide instructions in spanish.   insulin  degludec (TRESIBA ) 100 UNIT/ML FlexTouch Pen    Sig: Inject 34 Units into the skin daily. Please provide instructions in spanish    Dispense:  30 mL    Refill:  2    IM Program. If cost prohibitive do not fill, message provider at [facility]. Spanish instructions please.   Dulaglutide  (TRULICITY ) 0.75 MG/0.5ML SOAJ    Sig: Inject 0.75 mg into the skin once a week.    Dispense:  2 mL    Refill:  4    DOH List, no coverage. Please provide instructions in Spanish, thank you!   glucose blood test strip    Sig: Use to test blood sugar three times daily    Dispense:  100 each    Refill:  6    The patient is insulin  requiring, ICD 10 code E11.65. The patient tests 3 times per day. IM Program. 0     Follow up: 1  month for HTN and medication review    Remember:   Should you have any questions or concerns please call the internal medicine clinic at [phone].     Rayann Atway, D.O. [facility]

## 2024-06-09 NOTE — Assessment & Plan Note (Addendum)
 Last A1c 11.8, checked on 03/18/2024.  A1c today 8.8 today, improved. Per the last office visit, Tresiba  was increased to 34 units daily, patient was started on Trulicity  0.75 mg weekly and started on Jardiance  10 mg daily. Today, patient reports that he has been out of the Trulicity  IM injection for several months. He is taking Tresiba  34 U daily and Jardiance  daily. Reports that he checked his blood sugar this morning and it was 140s; reports that he needs refills on medications and needs test strips.  - Refills are sent fro Trulicity  and Tresiba  to the [facility] pharmacy (Trulicity  under [facility] list).  - Patient is advised to call for refills or reach out to the pharmacy for refills. - Continue Jardiance  10 mg daily -Patient is encouraged to complete the final assistance paperwork

## 2024-06-10 NOTE — Progress Notes (Signed)
 Internal Medicine Clinic Attending  Case discussed with the resident at the time of the visit.  We reviewed the resident's history and exam and pertinent patient test results.  I agree with the assessment, diagnosis, and plan of care documented in the resident's note.    Agree with starting Losartan  50mg  daily for HTN. Agree with restarting Trulicity  at 0.75mg  [place] weekly, increasing every 4 weeks if tolerated

## 2024-07-10 ENCOUNTER — Telehealth: Payer: Self-pay

## 2024-07-10 ENCOUNTER — Other Ambulatory Visit (HOSPITAL_COMMUNITY): Payer: Self-pay

## 2024-07-10 NOTE — Progress Notes (Signed)
 Pharmacy Medication Assistance Program Note    07/10/2024  Patient ID: Bari Handshoe, male  DOB: 30-Mar-1976, 48 y.o.  MRN:  [id number]     07/10/2024  Outreach Medication Three  Manufacturer Medication Three Novo Nordisk  Nordisk Drugs Tresiba   Dose of Tresiba  34 units QD  Type of Radiographer, therapeutic Assistance  Date Application Submitted to [employer] 07/10/2024  Method Application Sent to Manufacturer Online     RENEWAL SUBMITTED   In process of completing provider pages.

## 2024-07-14 ENCOUNTER — Encounter: Payer: Self-pay | Admitting: Student

## 2024-07-23 NOTE — Telephone Encounter (Signed)
 Pcp pages signed and faxed 07/23/24

## 2024-08-13 NOTE — Progress Notes (Signed)
 Pharmacy Medication Assistance Program Note    08/13/2024  Patient ID: Jason [name], male  DOB: [birth year], 48 y.o.  MRN:  [id number]     07/10/2024  Outreach Medication Three  Manufacturer Medication Three Novo Nordisk  Nordisk Drugs Tresiba   Dose of Tresiba  34 units QD  Type of Radiographer, therapeutic Assistance  Date Application Submitted to Manufacturer 07/10/2024  Method Application Sent to Manufacturer Online  Patient Assistance Determination Approved  Approval Start Date 08/12/2024  Approval End Date 08/04/2025     RENEWAL APPROVED

## 2024-09-03 ENCOUNTER — Telehealth: Payer: Self-pay

## 2024-09-03 NOTE — Telephone Encounter (Signed)
 Rec'd 3 boxes of Tresiba  U100.  Inject 34 units once daily (4 month supply)

## 2024-09-04 NOTE — Telephone Encounter (Signed)
 Patient number out of service  Reached out to emergency contact Lorelei. Was given patients new number [phone].  Spoke with pt. Informed his insulin  is ready for pickup at the office.

## 2024-09-09 ENCOUNTER — Other Ambulatory Visit: Payer: Self-pay

## 2024-09-10 ENCOUNTER — Other Ambulatory Visit: Payer: Self-pay

## 2024-09-29 ENCOUNTER — Other Ambulatory Visit: Payer: Self-pay

## 2024-09-30 ENCOUNTER — Telehealth: Payer: Self-pay | Admitting: Student

## 2024-09-30 DIAGNOSIS — E1140 Type 2 diabetes mellitus with diabetic neuropathy, unspecified: Secondary | ICD-10-CM

## 2024-09-30 NOTE — Telephone Encounter (Signed)
 Thank you :)

## 2024-09-30 NOTE — Telephone Encounter (Signed)
 Copied from CRM [id number]. Topic: Clinical - Prescription Issue >> Sep 30, 2024  1:32 PM Chiquita SQUIBB wrote: Reason for CRM: Patient is calling in due to being out of insulin  degludec (TRESIBA ) 100 UNIT/ML FlexTouch Pen [[id number]]. Patient went to the pharmacy yesterday and stated that he gets this medication in the office. Please advise the patient.

## 2024-09-30 NOTE — Telephone Encounter (Signed)
 Called and talked to pt -  appt scheduled for tomorrow 11/12 and informed pt to pick up Tresiba  medication at his appt (in the refrigerator).

## 2024-10-01 ENCOUNTER — Other Ambulatory Visit (HOSPITAL_COMMUNITY): Payer: Self-pay

## 2024-10-01 ENCOUNTER — Ambulatory Visit: Payer: Self-pay

## 2024-10-01 ENCOUNTER — Other Ambulatory Visit: Payer: Self-pay

## 2024-10-01 VITALS — BP 143/92 | HR 98 | Temp 98.1°F | Ht 63.0 in | Wt 147.0 lb

## 2024-10-01 DIAGNOSIS — Z23 Encounter for immunization: Secondary | ICD-10-CM

## 2024-10-01 DIAGNOSIS — Z794 Long term (current) use of insulin: Secondary | ICD-10-CM

## 2024-10-01 DIAGNOSIS — E785 Hyperlipidemia, unspecified: Secondary | ICD-10-CM

## 2024-10-01 DIAGNOSIS — E1140 Type 2 diabetes mellitus with diabetic neuropathy, unspecified: Secondary | ICD-10-CM

## 2024-10-01 DIAGNOSIS — I10 Essential (primary) hypertension: Secondary | ICD-10-CM

## 2024-10-01 LAB — POCT GLYCOSYLATED HEMOGLOBIN (HGB A1C): HbA1c, POC (controlled diabetic range): 9.3 % — AB (ref 0.0–7.0)

## 2024-10-01 LAB — GLUCOSE, CAPILLARY: Glucose-Capillary: 122 mg/dL — ABNORMAL HIGH (ref 70–99)

## 2024-10-01 MED ORDER — INSULIN DEGLUDEC 100 UNIT/ML ~~LOC~~ SOPN
17.0000 [IU] | PEN_INJECTOR | Freq: Every day | SUBCUTANEOUS | 2 refills | Status: AC
  Filled 2024-10-01: qty 30, 176d supply, fill #0

## 2024-10-01 MED ORDER — GLUCOSE BLOOD VI STRP
1.0000 | ORAL_STRIP | Freq: Three times a day (TID) | 6 refills | Status: AC
  Filled 2024-10-01 (×2): qty 100, 34d supply, fill #0

## 2024-10-01 MED ORDER — UNIFINE PENTIPS 32G X 4 MM MISC
2 refills | Status: AC
  Filled 2024-10-01: qty 100, 33d supply, fill #0
  Filled 2024-10-01: qty 100, fill #0

## 2024-10-01 MED ORDER — TRULICITY 0.75 MG/0.5ML ~~LOC~~ SOAJ
0.7500 mg | SUBCUTANEOUS | 4 refills | Status: AC
  Filled 2024-10-01 – 2024-10-15 (×4): qty 2, 28d supply, fill #0

## 2024-10-01 MED ORDER — TRUEPLUS LANCETS 28G MISC
12 refills | Status: AC
  Filled 2024-10-01: qty 100, 33d supply, fill #0
  Filled 2024-10-01: qty 100, fill #0

## 2024-10-01 MED ORDER — LOSARTAN POTASSIUM 50 MG PO TABS
50.0000 mg | ORAL_TABLET | Freq: Every day | ORAL | 2 refills | Status: AC
  Filled 2024-10-01 (×2): qty 90, 90d supply, fill #0

## 2024-10-01 MED ORDER — EMPAGLIFLOZIN 10 MG PO TABS
10.0000 mg | ORAL_TABLET | Freq: Every day | ORAL | 3 refills | Status: AC
Start: 2024-10-01 — End: ?
  Filled 2024-10-01 (×2): qty 30, 30d supply, fill #0

## 2024-10-01 MED ORDER — TRUE METRIX METER W/DEVICE KIT
PACK | 6 refills | Status: AC
  Filled 2024-10-01: qty 1, fill #0
  Filled 2024-10-01: qty 1, 360d supply, fill #0

## 2024-10-01 MED ORDER — INSULIN DEGLUDEC 100 UNIT/ML ~~LOC~~ SOPN
34.0000 [IU] | PEN_INJECTOR | Freq: Every day | SUBCUTANEOUS | 2 refills | Status: DC
  Filled 2024-10-01 (×2): qty 30, 88d supply, fill #0

## 2024-10-01 NOTE — Assessment & Plan Note (Signed)
 Patient uninsured, however discussed necessity for lipid panel given uncontrolled diabetes and last lipid panel elevated 2023.  Patient agreeable. - Lipid panel ordered

## 2024-10-01 NOTE — Progress Notes (Signed)
 CC: Diabetes f/u  HPI:  [name] is a 48 y.o. male with pertinent past medical history of T2DM (further medical history stated below) and presents today for A1C follow up. Please see problem based assessment and plan for additional details.  Last clinic appointment: 06/09/2024: Jason [name]  50 mg daily for hypertension, restarted Trulicity  0.75 mg weekly and to increase every 4 weeks as tolerated. Other diabetes medications included Tresiba  34 units daily, Trulicity  0.75 mg weekly, Jardiance  10 mg daily  Last Pertinent Labs Documented:     Latest Ref Rng & Units 03/18/2024   10:01 AM 10/10/2023    4:36 PM 11/28/2022   12:29 PM  BMP  Glucose 70 - 99 mg/dL 688  562  590   BUN 6 - 20 mg/dL 21  19  16    Creatinine 0.61 - 1.24 mg/dL 9.37  9.33  9.37   Sodium 135 - 145 mmol/L 133  134  135   Potassium 3.5 - 5.1 mmol/L 3.9  4.0  4.0   Chloride 98 - 111 mmol/L 100  100  100   CO2 22 - 32 mmol/L 23  24  24    Calcium  8.9 - 10.3 mg/dL 9.2  9.2  9.6        Latest Ref Rng & Units 11/28/2022   12:29 PM 11/04/2013    6:10 PM 11/04/2013    2:07 PM  CBC  WBC 4.0 - 10.5 K/uL 7.1  8.1  8.7   Hemoglobin 13.0 - 17.0 g/dL 85.5  83.1  81.7   Hematocrit 39.0 - 52.0 % 41.5  47.3  51.5   Platelets 150 - 400 K/uL 253  298  343     Lab Results  Component Value Date   HGBA1C 9.3 (A) 10/01/2024   HGBA1C 8.8 (A) 06/09/2024   HGBA1C 11.8 (A) 03/18/2024     Past Medical History:  Diagnosis Date   Diabetes mellitus 03/2008   diagnosed in april when admitted to [facility] with blood glucose of 581. HbA1C was 9.9   DKA (diabetic ketoacidoses)    hospitalized in 03/10/08   Latent tuberculosis infection    PPD test measured 12 mm on 05/25/08. CXR on 06/03/08 showed no active disease or adenopathy. Started to take isoniazid 300 mg daily. on 06/11/08 and will  take medication  along with Vit  B6 for 9 months.   Rheumatoid arthritis(714.0)    managed by Dr. Maya Nash. sronegative. Dr.  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    Current Outpatient Medications on File Prior to Visit  Medication Sig Dispense Refill   Alcohol  Swabs  (ALCOHOL  WIPES) 70 % PADS Please use an alcohol  wipe to cleanse your skin before each insulin  injection 100 each 0   Continuous Blood Gluc Receiver (FREESTYLE LIBRE 2 READER) DEVI Use to continuously monitor blood sugars 1 each 1   Continuous Blood Gluc Sensor (FREESTYLE LIBRE 2 SENSOR) MISC Use to continuously monitor blood sugars 2 each 11   Insulin  Syringe-Needle U-100 (ULTICARE INSULIN  SYRINGE) 31G X 1/4 1 ML MISC Use twice daily for injecting insulin  100 each 4   Insulin  Syringes, Disposable, U-100 1 ML MISC Use to inject insulin  twice daily. IM program. 100 each 6   moxifloxacin  (VIGAMOX ) 0.5 % ophthalmic solution Place 1 drop into the left eye 4 (four) times daily. Begin one day after surgery, continue as directed. 3 mL 5   ofloxacin  (OCUFLOX ) 0.3 % ophthalmic  solution Place 1 drop into the right eye 4 (four) times daily. Begin one day prior to surgery, Continue as directed. 5 mL 5   ofloxacin  (OCUFLOX ) 0.3 % ophthalmic solution Place 1 drop into the right eye 4 (four) times daily. Begin one day prior to surgery. Continue as directed 5 mL 5   prednisoLONE  acetate (PRED FORTE ) 1 % ophthalmic suspension Place 1 drop into the right eye 4 (four) times daily. Begin one day after surgery, continue as directed 10 mL 5   prednisoLONE  acetate (PRED FORTE ) 1 % ophthalmic suspension Place 1 drop into the right eye 4 (four) times daily. Begin one day after surgery, continue as directed 10 mL 5   prednisoLONE  acetate (PRED FORTE ) 1 % ophthalmic suspension Place 1 drop into the left eye 4 (four) times daily. Begin one day after surgery, continue as directed. 5 mL 5   No current facility-administered medications on file prior to visit.    Family History  Problem Relation Age of Onset   Diabetes Cousin     Hypertension Cousin    Diabetes Mother     Social History   Socioeconomic History   Marital status: Single    Spouse name: Not on file   Number of children: 1   Years of education: 9   Highest education level: Not on file  Occupational History    Employer: [employer]  Tobacco Use   Smoking status: Never   Smokeless tobacco: Never  Substance and Sexual Activity   Alcohol  use: No   Drug use: No   Sexual activity: Not on file  Other Topics Concern   Not on file  Social History Narrative   Works holiday representative jobs occasionally, 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   Social Drivers of Corporate Investment Banker Strain: Not on file  Food Insecurity: Not on file  Transportation Needs: Not on file  Physical Activity: Not on file  Stress: Not on file  Social Connections: Not on file  Intimate Partner Violence: Not on file    Review of Systems: As stated below  Vitals:   10/01/24 0932  BP: (!) 143/92  Pulse: 98  Temp: 98.1 F (36.7 C)  TempSrc: Oral  SpO2: 96%  Weight: 147 lb (66.7 kg)  Height: 5' 3 (1.6 m)    Physical Exam: Physical Exam Constitutional:      General: He is not in acute distress.    Appearance: He is obese. He is not ill-appearing.  Cardiovascular:     Rate and Rhythm: Normal rate and regular rhythm.     Heart sounds: Normal heart sounds. No murmur heard. Pulmonary:     Effort: Pulmonary effort is normal.     Breath sounds: No stridor. No wheezing, rhonchi or rales.  Neurological:     Mental Status: He is alert.      Assessment & Plan:   Patient seen with Dr. Karna  Patient was accompanied to clinic by daughter.  Denied the use of a adult nurse.  During clinic visit, not all information was translated.  Patient repeatedly stated he understood what was being explained and asked.  Would recommend professional translating service offered/provided a follow-up appointment. Assessment & Plan Type 2 diabetes mellitus with  diabetic neuropathy, with long-term current use of insulin  (HCC) Lab Results  Component Value Date   HGBA1C 9.3 (A) 10/01/2024   HGBA1C 8.8 (A) 06/09/2024   HGBA1C 11.8 (A) 03/18/2024       Latest Ref Rng &  Units 03/18/2024   10:01 AM 10/10/2023    4:36 PM 11/28/2022   12:29 PM  BMP  Glucose 70 - 99 mg/dL 688  562  590   BUN 6 - 20 mg/dL 21  19  16    Creatinine 0.61 - 1.24 mg/dL 9.37  9.33  9.37   Sodium 135 - 145 mmol/L 133  134  135   Potassium 3.5 - 5.1 mmol/L 3.9  4.0  4.0   Chloride 98 - 111 mmol/L 100  100  100   CO2 22 - 32 mmol/L 23  24  24    Calcium  8.9 - 10.3 mg/dL 9.2  9.2  9.6     Medications: Prescribed medications include Trulicity  0.75 mg weekly, Jardiance  10 mg, and Tresiba  34 units nightly.  Patient reports that he has been out of Tresiba  for 2 weeks and jardiance  several months.  He reports that he has been taking the Trulicity  weekly.  It appears that the last Trulicity  dispense was 09/10/2024.  The last Jardiance  dispense for 30 days was 04/28/2024. He picks up Tresiba  within the clinic, the timeline also did not align for when he had been out of the insulin . Blood Sugars at home: Patient only checks BG occassionally.  Readings that he can remember include 270s and 220s.  He reportedly fasted this morning, and blood glucose was 122.  He first reported that he has had no episodes of lows, however then stated that there are periods of time where he feeling shaky and anxious then feels better when he eats candy.  History is very unclear.  Patient would benefit from continuous glucose monitor. For short term as patient is uninsured- Dexcom has a one time free sensor.  For long term, [employer] has CGM for 75$ months coupon.  Diet and Exercise: Does not currently abide by diabetic diet, exercise includes walking. Opthalmology: goes to eye specialists for retina specialist every 6 weeks  Foot Exam: Diabetic foot exam performed 03/18/2024 Diabetic Kidney Evaluation: GFR measured  >60 03/18/24   Patient's history remains very unclear on medication adherence, blood sugars at home, understanding of disease process.  Counseled extensively on dangers of untreated diabetes including increased risk of stroke, worsening neuropathy, risk of amputation, microvascular disease.  Counseled on dangers of hypoglycemia including confusion and death.  Patient adamant that he just needs his 34 units of insulin  and he feels good at that dose.  I am concerned to dispense insulin  at that dosing due to low BG in office when fasting (122), so will lower dose of insulin  to 17 units of Tresiba  daily in addition to the 10 mg of Jardiance  and Trulicity  0.75 mg weekly.  Patient stated that if he feels poorly he will increase to the 34 units himself.  Counseled patient on dangers of doing this (as stated above). Instructed patient to write down blood sugars every morning before he eats and after he eats and to keep a log. Recommended close follow-up.  Patient stated understanding to all of the above discussed as translated by his daughter.    -Trulicity  0.75 mg weekly -Jardiance  10 mg daily -Tresiba  17 units daily -Follow-up with diabetic nutritionist, Arland, in 2 weeks -F/U CGM for patient if possible at appointment with Donna/1 month follow-up -Follow-up with physician in 1 month Essential hypertension Vitals:   10/01/24 0932  BP: (!) 143/92  Medications prescribed include: [name]  50 mg. Last dispensed 09/10/2024, however unsure if patient is taking.  Patient does not take blood pressure  at home.  Counseled patient on importance of blood pressure control, risks associated with chronic high blood pressure.  Patient stated understanding. - Refilled [name]  50 mg tablet - Blood pressure recheck in 2 weeks Hyperlipidemia, unspecified hyperlipidemia type Patient uninsured, however discussed necessity for lipid panel given uncontrolled diabetes and last lipid panel elevated 2023.  Patient agreeable. -  Lipid panel ordered   Orders Placed This Encounter  Procedures   Flu vaccine trivalent PF, 6mos and older(Flulaval,Afluria,Fluarix,Fluzone)   Lipid Profile   Glucose, capillary   POC Hbg A1C     Audrielle Vankuren, D.O. [facility], PGY-1 Date 10/01/2024 Time 8:20 PM

## 2024-10-01 NOTE — Assessment & Plan Note (Addendum)
 Vitals:   10/01/24 0932  BP: (!) 143/92  Medications prescribed include: Losartan  50 mg. Last dispensed 09/10/2024, however unsure if patient is taking.  Patient does not take blood pressure at home.  Counseled patient on importance of blood pressure control, risks associated with chronic high blood pressure.  Patient stated understanding. - Refilled losartan  50 mg tablet - Blood pressure recheck in 2 weeks

## 2024-10-01 NOTE — Assessment & Plan Note (Addendum)
 Lab Results  Component Value Date   HGBA1C 9.3 (A) 10/01/2024   HGBA1C 8.8 (A) 06/09/2024   HGBA1C 11.8 (A) 03/18/2024       Latest Ref Rng & Units 03/18/2024   10:01 AM 10/10/2023    4:36 PM 11/28/2022   12:29 PM  BMP  Glucose 70 - 99 mg/dL 688  562  590   BUN 6 - 20 mg/dL 21  19  16    Creatinine 0.61 - 1.24 mg/dL 9.37  9.33  9.37   Sodium 135 - 145 mmol/L 133  134  135   Potassium 3.5 - 5.1 mmol/L 3.9  4.0  4.0   Chloride 98 - 111 mmol/L 100  100  100   CO2 22 - 32 mmol/L 23  24  24    Calcium  8.9 - 10.3 mg/dL 9.2  9.2  9.6     Medications: Prescribed medications include Trulicity  0.75 mg weekly, Jardiance  10 mg, and Tresiba  34 units nightly.  Patient reports that he has been out of Tresiba  for 2 weeks and jardiance  several months.  He reports that he has been taking the Trulicity  weekly.  It appears that the last Trulicity  dispense was 09/10/2024.  The last Jardiance  dispense for 30 days was 04/28/2024. He picks up Tresiba  within the clinic, the timeline also did not align for when he had been out of the insulin . Blood Sugars at home: Patient only checks BG occassionally.  Readings that he can remember include 270s and 220s.  He reportedly fasted this morning, and blood glucose was 122.  He first reported that he has had no episodes of lows, however then stated that there are periods of time where he feeling shaky and anxious then feels better when he eats candy.  History is very unclear.  Patient would benefit from continuous glucose monitor. For short term as patient is uninsured- Dexcom has a one time free sensor.  For long term, [employer] has CGM for 75$ months coupon.  Diet and Exercise: Does not currently abide by diabetic diet, exercise includes walking. Opthalmology: goes to eye specialists for retina specialist every 6 weeks  Foot Exam: Diabetic foot exam performed 03/18/2024 Diabetic Kidney Evaluation: GFR measured >60 03/18/24   Patient's history remains very unclear on  medication adherence, blood sugars at home, understanding of disease process.  Counseled extensively on dangers of untreated diabetes including increased risk of stroke, worsening neuropathy, risk of amputation, microvascular disease.  Counseled on dangers of hypoglycemia including confusion and death.  Patient adamant that he just needs his 34 units of insulin  and he feels good at that dose.  I am concerned to dispense insulin  at that dosing due to low BG in office when fasting (122), so will lower dose of insulin  to 17 units of Tresiba  daily in addition to the 10 mg of Jardiance  and Trulicity  0.75 mg weekly.  Patient stated that if he feels poorly he will increase to the 34 units himself.  Counseled patient on dangers of doing this (as stated above). Instructed patient to write down blood sugars every morning before he eats and after he eats and to keep a log. Recommended close follow-up.  Patient stated understanding to all of the above discussed as translated by his daughter.    -Trulicity  0.75 mg weekly -Jardiance  10 mg daily -Tresiba  17 units daily -Follow-up with diabetic nutritionist, Arland, in 2 weeks -F/U CGM for patient if possible at appointment with Donna/1 month follow-up -Follow-up with physician in 1 month

## 2024-10-01 NOTE — Patient Instructions (Addendum)
 Today we discussed the following medical conditions and plan:    - Tome su medicamento para la presin arterial (losartn 50 mg al da).  - Tome todos sus medicamentos para la diabetes  - Trulicity  0.75 mg semanalmente (inyeccin) - Jardiance  10 mg (pastilla) - Tresiba  17 Unidades diarias (inyecciones) -Anota tus niveles de azcar en sangre cada maana antes de comer y despus de comer, lleva un registro. Est atento a las hipoglucemias  (<70),. Si se siente mal, mdase el nivel de azcar en sangre y anote el resultado. Si tiene primary school teacher en los prximos das (antes de comer ms de 150 caloras), llame a la clnica.  - Programe una cita de seguimiento con Arland (coordinadora de diabetes).  Te veremos en dos semanas con Arland para controlarte la presin arterial. Nos veremos en 1 mese para el seguimiento de su diabetes con el mdico.   - take your blood pressure medication (losartan  50 mg daily) - take all your diabetes medications  Trulicity  0.75 mg weekly (injection) Jardiance  10 mg (pill) Tresiba  17 Units daily (injections) - Write down blood sugars every morning before you eat and after your eat, keep a log. watch out for low blood sugars (<70), if you feel poorly, check your blood sugar and write the number down. if you have several high readings over the next couple of days (before eating >150) call the clinic - schedule follow up with donna (diabetic coordinator)  We will see you in 2 weeks with Arland and for blood pressure check We will see you in 1 month for diabetes follow up with doctor   We look forward to seeing you next time. Please call our clinic at [phone] if you have any questions or concerns. The best time to call is Monday-Friday from 9am-4pm, but there is someone available 24/7. If you need medication refills, please notify your pharmacy one week in advance and they will send us  a request.   Thank you for trusting me with your care. Wishing you the  best!   Sallyanne Primas, DO  [facility]

## 2024-10-02 LAB — LIPID PANEL
Chol/HDL Ratio: 4.5 ratio (ref 0.0–5.0)
Cholesterol, Total: 242 mg/dL — ABNORMAL HIGH (ref 100–199)
HDL: 54 mg/dL (ref >39)
LDL Chol Calc (NIH): 151 mg/dL — ABNORMAL HIGH (ref 0–99)
Triglycerides: 203 mg/dL — ABNORMAL HIGH (ref 0–149)
VLDL Cholesterol Cal: 37 mg/dL (ref 5–40)

## 2024-10-02 NOTE — Progress Notes (Signed)
 Internal Medicine Clinic Attending  I was physically present during the key portions of the resident provided service and participated in the medical decision making of patient's management care. I reviewed pertinent patient test results.  The assessment, diagnosis, and plan were formulated together and I agree with the documentation in the resident's note.  Receives insulin  through patient assistance, picks up supply at [facility]. Reviewed with pharmacy tech and patient assistance coordinator, Lavern, with 30-month supply distributed in April. Additional supply ready in October but not yet dispensed. Mr. Dahan mentions he does occasionally miss insulin  doses but unable to quantify how often. Endorsed symptoms of hypoglycemia to Dr. Benuel, unable to quantify how often and does not regularly measure blood sugar. Fasting BG 122 this morning without insulin . We discussed resuming Jardiance , continuing Trulicity , and reduced dose of Tresiba  insulin  with regular use and more consistent home data in order to make further changes. Close f/u and regular monitoring will be important for continued success in treating this patient's diabetes without risk of hypoglycemia.  Karna Fellows, MD

## 2024-10-03 ENCOUNTER — Other Ambulatory Visit (HOSPITAL_COMMUNITY): Payer: Self-pay

## 2024-10-03 ENCOUNTER — Ambulatory Visit: Payer: Self-pay

## 2024-10-03 DIAGNOSIS — E785 Hyperlipidemia, unspecified: Secondary | ICD-10-CM

## 2024-10-03 MED ORDER — ROSUVASTATIN CALCIUM 20 MG PO TABS
20.0000 mg | ORAL_TABLET | Freq: Every day | ORAL | 11 refills | Status: AC
  Filled 2024-10-03 – 2024-10-10 (×3): qty 30, 30d supply, fill #0

## 2024-10-09 ENCOUNTER — Other Ambulatory Visit: Payer: Self-pay

## 2024-10-10 ENCOUNTER — Other Ambulatory Visit (HOSPITAL_COMMUNITY): Payer: Self-pay

## 2024-10-10 ENCOUNTER — Other Ambulatory Visit: Payer: Self-pay

## 2024-10-13 ENCOUNTER — Other Ambulatory Visit: Payer: Self-pay

## 2024-10-15 ENCOUNTER — Ambulatory Visit: Payer: Self-pay

## 2024-10-15 ENCOUNTER — Other Ambulatory Visit: Payer: Self-pay

## 2024-10-15 ENCOUNTER — Other Ambulatory Visit (HOSPITAL_COMMUNITY): Payer: Self-pay

## 2024-10-15 ENCOUNTER — Ambulatory Visit: Payer: Self-pay | Admitting: Dietician

## 2024-10-15 ENCOUNTER — Telehealth: Payer: Self-pay | Admitting: Dietician

## 2024-10-15 NOTE — Telephone Encounter (Signed)
 Called patient using [employer] interpre[employer](#[phone]), no answer, they left message for patient to call our office to schedule  an appointment with me (diabetes educator, dietician)

## 2024-10-21 ENCOUNTER — Telehealth: Payer: Self-pay

## 2024-10-23 ENCOUNTER — Other Ambulatory Visit: Payer: Self-pay

## 2024-10-24 ENCOUNTER — Other Ambulatory Visit: Payer: Self-pay

## 2024-10-31 ENCOUNTER — Ambulatory Visit: Payer: Self-pay

## 2024-11-04 ENCOUNTER — Ambulatory Visit: Payer: Self-pay

## 2024-11-12 ENCOUNTER — Ambulatory Visit: Payer: Self-pay | Admitting: Student
